# Patient Record
Sex: Female | Born: 1985 | Hispanic: No | Smoking: Former smoker
Health system: Southern US, Community
[De-identification: ages and names within clinical notes are randomized; demographics above are authoritative.]

## PROBLEM LIST (undated history)

## (undated) DIAGNOSIS — J9819 Other pulmonary collapse: Secondary | ICD-10-CM

## (undated) DIAGNOSIS — F329 Major depressive disorder, single episode, unspecified: Secondary | ICD-10-CM

## (undated) DIAGNOSIS — F32A Depression, unspecified: Secondary | ICD-10-CM

## (undated) DIAGNOSIS — W3400XA Accidental discharge from unspecified firearms or gun, initial encounter: Secondary | ICD-10-CM

## (undated) DIAGNOSIS — G5600 Carpal tunnel syndrome, unspecified upper limb: Secondary | ICD-10-CM

## (undated) DIAGNOSIS — K529 Noninfective gastroenteritis and colitis, unspecified: Secondary | ICD-10-CM

## (undated) DIAGNOSIS — F1910 Other psychoactive substance abuse, uncomplicated: Secondary | ICD-10-CM

## (undated) DIAGNOSIS — R569 Unspecified convulsions: Secondary | ICD-10-CM

## (undated) DIAGNOSIS — F419 Anxiety disorder, unspecified: Secondary | ICD-10-CM

## (undated) DIAGNOSIS — F4310 Post-traumatic stress disorder, unspecified: Secondary | ICD-10-CM

## (undated) DIAGNOSIS — E669 Obesity, unspecified: Secondary | ICD-10-CM

## (undated) HISTORY — DX: Other psychoactive substance abuse, uncomplicated: F19.10

## (undated) HISTORY — PX: TUBAL LIGATION: SHX77

---
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
## 2006-02-26 ENCOUNTER — Emergency Department (HOSPITAL_COMMUNITY): Admission: EM | Admit: 2006-02-26 | Discharge: 2006-02-26 | Payer: Self-pay | Admitting: Emergency Medicine

## 2006-06-19 ENCOUNTER — Emergency Department (HOSPITAL_COMMUNITY): Admission: EM | Admit: 2006-06-19 | Discharge: 2006-06-19 | Payer: Self-pay | Admitting: Emergency Medicine

## 2006-09-04 ENCOUNTER — Inpatient Hospital Stay (HOSPITAL_COMMUNITY): Admission: AD | Admit: 2006-09-04 | Discharge: 2006-09-04 | Payer: Self-pay | Admitting: Gynecology

## 2006-09-08 ENCOUNTER — Ambulatory Visit (HOSPITAL_COMMUNITY): Admission: RE | Admit: 2006-09-08 | Discharge: 2006-09-08 | Payer: Self-pay | Admitting: Family Medicine

## 2006-12-19 ENCOUNTER — Ambulatory Visit: Payer: Self-pay | Admitting: Obstetrics & Gynecology

## 2006-12-19 ENCOUNTER — Inpatient Hospital Stay (HOSPITAL_COMMUNITY): Admission: AD | Admit: 2006-12-19 | Discharge: 2006-12-19 | Payer: Self-pay | Admitting: Obstetrics & Gynecology

## 2007-01-16 ENCOUNTER — Inpatient Hospital Stay (HOSPITAL_COMMUNITY): Admission: AD | Admit: 2007-01-16 | Discharge: 2007-01-16 | Payer: Self-pay | Admitting: Family Medicine

## 2007-01-16 ENCOUNTER — Ambulatory Visit: Payer: Self-pay | Admitting: Family Medicine

## 2007-01-17 ENCOUNTER — Inpatient Hospital Stay (HOSPITAL_COMMUNITY): Admission: AD | Admit: 2007-01-17 | Discharge: 2007-01-20 | Payer: Self-pay | Admitting: Gynecology

## 2007-01-17 ENCOUNTER — Ambulatory Visit: Payer: Self-pay | Admitting: Gynecology

## 2007-05-10 ENCOUNTER — Inpatient Hospital Stay (HOSPITAL_COMMUNITY): Admission: AD | Admit: 2007-05-10 | Discharge: 2007-05-10 | Payer: Self-pay | Admitting: Gynecology

## 2007-05-19 ENCOUNTER — Ambulatory Visit: Payer: Self-pay | Admitting: Obstetrics & Gynecology

## 2007-06-30 ENCOUNTER — Other Ambulatory Visit: Admission: RE | Admit: 2007-06-30 | Discharge: 2007-06-30 | Payer: Self-pay | Admitting: Obstetrics & Gynecology

## 2007-06-30 ENCOUNTER — Ambulatory Visit: Payer: Self-pay | Admitting: Obstetrics and Gynecology

## 2007-06-30 ENCOUNTER — Encounter: Payer: Self-pay | Admitting: Obstetrics and Gynecology

## 2007-07-26 ENCOUNTER — Emergency Department (HOSPITAL_COMMUNITY): Admission: EM | Admit: 2007-07-26 | Discharge: 2007-07-26 | Payer: Self-pay | Admitting: Emergency Medicine

## 2007-10-10 ENCOUNTER — Emergency Department (HOSPITAL_COMMUNITY): Admission: EM | Admit: 2007-10-10 | Discharge: 2007-10-11 | Payer: Self-pay | Admitting: Emergency Medicine

## 2007-11-08 ENCOUNTER — Emergency Department (HOSPITAL_COMMUNITY): Admission: EM | Admit: 2007-11-08 | Discharge: 2007-11-08 | Payer: Self-pay | Admitting: Emergency Medicine

## 2007-12-25 ENCOUNTER — Emergency Department (HOSPITAL_COMMUNITY): Admission: EM | Admit: 2007-12-25 | Discharge: 2007-12-25 | Payer: Self-pay | Admitting: Emergency Medicine

## 2008-01-26 ENCOUNTER — Emergency Department (HOSPITAL_COMMUNITY): Admission: EM | Admit: 2008-01-26 | Discharge: 2008-01-26 | Payer: Self-pay | Admitting: Emergency Medicine

## 2008-02-16 ENCOUNTER — Emergency Department (HOSPITAL_COMMUNITY): Admission: EM | Admit: 2008-02-16 | Discharge: 2008-02-17 | Payer: Self-pay | Admitting: Emergency Medicine

## 2008-12-12 ENCOUNTER — Ambulatory Visit: Payer: Self-pay | Admitting: Family

## 2008-12-12 ENCOUNTER — Inpatient Hospital Stay (HOSPITAL_COMMUNITY): Admission: AD | Admit: 2008-12-12 | Discharge: 2008-12-12 | Payer: Self-pay | Admitting: Obstetrics & Gynecology

## 2008-12-20 ENCOUNTER — Ambulatory Visit: Payer: Self-pay | Admitting: Obstetrics and Gynecology

## 2008-12-20 ENCOUNTER — Inpatient Hospital Stay (HOSPITAL_COMMUNITY): Admission: AD | Admit: 2008-12-20 | Discharge: 2008-12-20 | Payer: Self-pay | Admitting: Obstetrics & Gynecology

## 2008-12-24 ENCOUNTER — Ambulatory Visit: Payer: Self-pay | Admitting: Obstetrics & Gynecology

## 2008-12-24 LAB — CONVERTED CEMR LAB

## 2008-12-27 ENCOUNTER — Ambulatory Visit (HOSPITAL_COMMUNITY): Admission: RE | Admit: 2008-12-27 | Discharge: 2008-12-27 | Payer: Self-pay | Admitting: Obstetrics & Gynecology

## 2008-12-31 ENCOUNTER — Ambulatory Visit (HOSPITAL_COMMUNITY): Admission: RE | Admit: 2008-12-31 | Discharge: 2008-12-31 | Payer: Self-pay | Admitting: Obstetrics & Gynecology

## 2008-12-31 ENCOUNTER — Ambulatory Visit: Payer: Self-pay | Admitting: Obstetrics & Gynecology

## 2009-01-06 ENCOUNTER — Inpatient Hospital Stay (HOSPITAL_COMMUNITY): Admission: AD | Admit: 2009-01-06 | Discharge: 2009-01-08 | Payer: Self-pay | Admitting: Obstetrics & Gynecology

## 2009-01-06 ENCOUNTER — Ambulatory Visit: Payer: Self-pay | Admitting: Advanced Practice Midwife

## 2009-01-09 ENCOUNTER — Inpatient Hospital Stay (HOSPITAL_COMMUNITY): Admission: AD | Admit: 2009-01-09 | Discharge: 2009-01-09 | Payer: Self-pay | Admitting: Obstetrics & Gynecology

## 2009-01-10 ENCOUNTER — Ambulatory Visit: Payer: Self-pay | Admitting: Obstetrics & Gynecology

## 2009-01-17 ENCOUNTER — Ambulatory Visit: Payer: Self-pay | Admitting: Obstetrics & Gynecology

## 2009-01-21 ENCOUNTER — Ambulatory Visit: Payer: Self-pay | Admitting: Obstetrics & Gynecology

## 2009-01-24 ENCOUNTER — Ambulatory Visit: Payer: Self-pay | Admitting: Obstetrics & Gynecology

## 2009-01-24 ENCOUNTER — Ambulatory Visit (HOSPITAL_COMMUNITY): Admission: RE | Admit: 2009-01-24 | Discharge: 2009-01-24 | Payer: Self-pay | Admitting: Obstetrics & Gynecology

## 2009-01-25 ENCOUNTER — Ambulatory Visit: Payer: Self-pay | Admitting: Obstetrics & Gynecology

## 2009-01-25 ENCOUNTER — Inpatient Hospital Stay (HOSPITAL_COMMUNITY): Admission: AD | Admit: 2009-01-25 | Discharge: 2009-01-29 | Payer: Self-pay | Admitting: Obstetrics & Gynecology

## 2009-01-26 ENCOUNTER — Encounter: Payer: Self-pay | Admitting: Obstetrics & Gynecology

## 2009-02-15 ENCOUNTER — Emergency Department (HOSPITAL_COMMUNITY): Admission: EM | Admit: 2009-02-15 | Discharge: 2009-02-16 | Payer: Self-pay | Admitting: Internal Medicine

## 2009-02-21 ENCOUNTER — Inpatient Hospital Stay (HOSPITAL_COMMUNITY): Admission: AD | Admit: 2009-02-21 | Discharge: 2009-02-21 | Payer: Self-pay | Admitting: Family Medicine

## 2009-02-21 ENCOUNTER — Ambulatory Visit: Payer: Self-pay | Admitting: Obstetrics and Gynecology

## 2009-03-05 ENCOUNTER — Inpatient Hospital Stay (HOSPITAL_COMMUNITY): Admission: AD | Admit: 2009-03-05 | Discharge: 2009-03-06 | Payer: Self-pay | Admitting: Obstetrics & Gynecology

## 2009-03-05 ENCOUNTER — Ambulatory Visit: Payer: Self-pay | Admitting: Obstetrics & Gynecology

## 2009-03-09 ENCOUNTER — Ambulatory Visit: Payer: Self-pay | Admitting: Advanced Practice Midwife

## 2009-03-09 ENCOUNTER — Inpatient Hospital Stay (HOSPITAL_COMMUNITY): Admission: AD | Admit: 2009-03-09 | Discharge: 2009-03-09 | Payer: Self-pay | Admitting: Obstetrics & Gynecology

## 2009-06-25 ENCOUNTER — Emergency Department (HOSPITAL_COMMUNITY): Admission: EM | Admit: 2009-06-25 | Discharge: 2009-06-25 | Payer: Self-pay | Admitting: Emergency Medicine

## 2010-07-06 ENCOUNTER — Encounter: Payer: Self-pay | Admitting: Emergency Medicine

## 2010-07-07 ENCOUNTER — Encounter: Payer: Self-pay | Admitting: Obstetrics & Gynecology

## 2010-09-19 LAB — WET PREP, GENITAL
Clue Cells Wet Prep HPF POC: NONE SEEN
Clue Cells Wet Prep HPF POC: NONE SEEN
Trich, Wet Prep: NONE SEEN

## 2010-09-19 LAB — CBC
HCT: 33.3 % — ABNORMAL LOW (ref 36.0–46.0)
Hemoglobin: 11.2 g/dL — ABNORMAL LOW (ref 12.0–15.0)
MCHC: 33.7 g/dL (ref 30.0–36.0)
RBC: 3.89 MIL/uL (ref 3.87–5.11)

## 2010-09-19 LAB — URINE MICROSCOPIC-ADD ON

## 2010-09-19 LAB — URINALYSIS, ROUTINE W REFLEX MICROSCOPIC
Bilirubin Urine: NEGATIVE
Glucose, UA: NEGATIVE mg/dL
Hgb urine dipstick: NEGATIVE
Specific Gravity, Urine: 1.025 (ref 1.005–1.030)
pH: 7 (ref 5.0–8.0)

## 2010-09-19 LAB — DIFFERENTIAL
Lymphocytes Relative: 41 % (ref 12–46)
Monocytes Absolute: 0.7 10*3/uL (ref 0.1–1.0)
Monocytes Relative: 8 % (ref 3–12)
Neutro Abs: 4.5 10*3/uL (ref 1.7–7.7)

## 2010-09-19 LAB — GC/CHLAMYDIA PROBE AMP, GENITAL
Chlamydia, DNA Probe: NEGATIVE
GC Probe Amp, Genital: NEGATIVE

## 2010-09-20 LAB — RPR: RPR Ser Ql: NONREACTIVE

## 2010-09-20 LAB — POCT URINALYSIS DIP (DEVICE)
Bilirubin Urine: NEGATIVE
Glucose, UA: NEGATIVE mg/dL
Glucose, UA: NEGATIVE mg/dL
Hgb urine dipstick: NEGATIVE
Nitrite: NEGATIVE
Nitrite: NEGATIVE
Protein, ur: 30 mg/dL — AB
Urobilinogen, UA: 0.2 mg/dL (ref 0.0–1.0)
Urobilinogen, UA: 0.2 mg/dL (ref 0.0–1.0)
pH: 6 (ref 5.0–8.0)

## 2010-09-20 LAB — CBC
HCT: 26.9 % — ABNORMAL LOW (ref 36.0–46.0)
Hemoglobin: 11.8 g/dL — ABNORMAL LOW (ref 12.0–15.0)
MCHC: 34.5 g/dL (ref 30.0–36.0)
MCHC: 34.6 g/dL (ref 30.0–36.0)
MCV: 90.9 fL (ref 78.0–100.0)
MCV: 90.3 fL (ref 78.0–100.0)
Platelets: 301 10*3/uL (ref 150–400)
Platelets: 234 10*3/uL (ref 150–400)
RBC: 3.05 MIL/uL — ABNORMAL LOW (ref 3.87–5.11)
RBC: 3.78 MIL/uL — ABNORMAL LOW (ref 3.87–5.11)
RDW: 13.4 % (ref 11.5–15.5)
WBC: 10.8 10*3/uL — ABNORMAL HIGH (ref 4.0–10.5)

## 2010-09-21 LAB — CLOSTRIDIUM DIFFICILE EIA: C difficile Toxins A+B, EIA: NEGATIVE

## 2010-09-21 LAB — URINE MICROSCOPIC-ADD ON

## 2010-09-21 LAB — POCT URINALYSIS DIP (DEVICE)
Glucose, UA: NEGATIVE mg/dL
Glucose, UA: NEGATIVE mg/dL
Hgb urine dipstick: NEGATIVE
Hgb urine dipstick: NEGATIVE
Ketones, ur: NEGATIVE mg/dL
Nitrite: NEGATIVE
Protein, ur: 100 mg/dL — AB
Specific Gravity, Urine: 1.02 (ref 1.005–1.030)
Specific Gravity, Urine: >=1.03 (ref 1.005–1.030)
Urobilinogen, UA: 1 mg/dL (ref 0.0–1.0)
pH: 7.5 (ref 5.0–8.0)

## 2010-09-21 LAB — FETAL FIBRONECTIN: Fetal Fibronectin: NEGATIVE

## 2010-09-21 LAB — CBC
HCT: 31.9 % — ABNORMAL LOW (ref 36.0–46.0)
HCT: 34 % — ABNORMAL LOW (ref 36.0–46.0)
Hemoglobin: 11.2 g/dL — ABNORMAL LOW (ref 12.0–15.0)
Hemoglobin: 11.9 g/dL — ABNORMAL LOW (ref 12.0–15.0)
MCHC: 34.9 g/dL (ref 30.0–36.0)
MCHC: 35.1 g/dL (ref 30.0–36.0)
MCV: 91 fL (ref 78.0–100.0)
MCV: 91.8 fL (ref 78.0–100.0)
Platelets: 277 10*3/uL (ref 150–400)
Platelets: 313 10*3/uL (ref 150–400)
RBC: 3.48 MIL/uL — ABNORMAL LOW (ref 3.87–5.11)
RBC: 3.73 MIL/uL — ABNORMAL LOW (ref 3.87–5.11)
RDW: 12.9 % (ref 11.5–15.5)
RDW: 13.2 % (ref 11.5–15.5)
WBC: 11.9 10*3/uL — ABNORMAL HIGH (ref 4.0–10.5)
WBC: 9.6 10*3/uL (ref 4.0–10.5)

## 2010-09-21 LAB — WET PREP, GENITAL
Clue Cells Wet Prep HPF POC: NONE SEEN
Trich, Wet Prep: NONE SEEN
Yeast Wet Prep HPF POC: NONE SEEN

## 2010-09-21 LAB — URINALYSIS, ROUTINE W REFLEX MICROSCOPIC
Bilirubin Urine: NEGATIVE
Glucose, UA: NEGATIVE mg/dL
Hgb urine dipstick: NEGATIVE
Hgb urine dipstick: NEGATIVE
Ketones, ur: 15 mg/dL — AB
Nitrite: NEGATIVE
Nitrite: NEGATIVE
Protein, ur: NEGATIVE mg/dL
Specific Gravity, Urine: 1.02 (ref 1.005–1.030)
Specific Gravity, Urine: 1.02 (ref 1.005–1.030)
Urobilinogen, UA: 1 mg/dL (ref 0.0–1.0)
Urobilinogen, UA: 1 mg/dL (ref 0.0–1.0)
pH: 6.5 (ref 5.0–8.0)
pH: 7 (ref 5.0–8.0)

## 2010-09-21 LAB — STREP B DNA PROBE: Strep Group B Ag: NEGATIVE

## 2010-09-21 LAB — GC/CHLAMYDIA PROBE AMP, GENITAL
Chlamydia, DNA Probe: NEGATIVE
GC Probe Amp, Genital: NEGATIVE

## 2010-09-22 LAB — WET PREP, GENITAL
Trich, Wet Prep: NONE SEEN
Yeast Wet Prep HPF POC: NONE SEEN

## 2010-09-22 LAB — FETAL FIBRONECTIN: Fetal Fibronectin: NEGATIVE

## 2010-09-22 LAB — URINALYSIS, ROUTINE W REFLEX MICROSCOPIC
Bilirubin Urine: NEGATIVE
Glucose, UA: NEGATIVE mg/dL
Hgb urine dipstick: NEGATIVE
Ketones, ur: NEGATIVE mg/dL
pH: 7 (ref 5.0–8.0)

## 2010-09-22 LAB — STREP B DNA PROBE: Strep Group B Ag: NEGATIVE

## 2010-10-28 NOTE — Group Therapy Note (Signed)
NAME:  Patricia Drake, Patricia Drake NO.:  [id number]   MEDICAL RECORD NO.:  [id number]          PATIENT TYPE:  WOC   LOCATION:  [facility]                   FACILITY:  [facility]   PHYSICIAN:  Elsie Lincoln, MD      DATE OF BIRTH:  [birth year]   DATE OF SERVICE:  05/19/2007                                  CLINIC NOTE   The patient is a 25 year old G 1 P 1 female who presents for evaluation  for treatment.  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  However, she had an endocervical curettage that came  back detached fragments of dysplastic squamous mucosa.  Given that this  is a vague positive ECC, I believe that we should go ahead with  excisional procedure.  I am not proceeding with cold knife cone because  there could be a chance left over from the biopsy on the cervix, and she  is very young, so we will just proceed with a LEEP.  She is going to  watch the video today and we will do a LEEP here in the office.  We will  schedule her in the next 4 to 6 weeks.           ______________________________  Elsie Lincoln, MD     KL/MEDQ  D:  05/19/2007  T:  05/19/2007  Job:  [id number]

## 2010-10-28 NOTE — Discharge Summary (Signed)
Patricia Drake, LEHRMAN               ACCOUNT NO.:  [id number]   MEDICAL RECORD NO.:  [id number]          PATIENT TYPE:  INP   LOCATION:  9304                          [facility]ACILITY:  [facility]   PHYSICIAN:  Catalina Antigua, MD     DATE OF BIRTH:  01/25/1986   DATE OF ADMISSION:  01/25/2009  DATE OF DISCHARGE:  01/29/2009                               DISCHARGE SUMMARY   DISCHARGE DIAGNOSES:  1. Preterm prolonged rupture of membranes at 34.6 weeks with twin      gestation, delivered.   Dictation ended at this point.      Estill Bamberg, MD      Catalina Antigua, MD     RL/MEDQ  D:  01/29/2009  T:  01/29/2009  Job:  [id number]

## 2010-10-28 NOTE — Discharge Summary (Signed)
Patricia Drake, Patricia Drake NO.:  [id number]   MEDICAL RECORD NO.:  [id number]          PATIENT TYPE:  WOC   LOCATION:  WOC                          FACILITY:  [facility]   PHYSICIAN:  Scheryl Darter, MD       DATE OF BIRTH:  17-Sep-1985   DATE OF ADMISSION:  01/06/2009  DATE OF DISCHARGE:  01/08/2009                               DISCHARGE SUMMARY   DIAGNOSES:  1. Intrauterine pregnancy at 32 weeks and 2 days gestation with twins.  2. Preterm labor, arrested.   The patient is a 25 year old white female, gravida 2, para 1-0-0-1 with  monozygotic twin gestation, estimated date of confinement on March 03, 2009, who presented at [redacted] weeks gestation complaining of symptoms of  preterm labor.  No rupture of membranes.  No bleeding.  She noted good  fetal movement.   PAST MEDICAL HISTORY:  Obesity.   PAST SURGICAL HISTORY:  None.   PAST OB HISTORY:  One term vaginal delivery.   PAST GYNECOLOGIC HISTORY:  Chlamydia and gonorrhea infection.   ALLERGIES:  SULFA drugs.   SOCIAL HISTORY:  She is a former smoker and denies any alcohol or drug  use.   FAMILY HISTORY:  Diabetes.   PHYSICAL EXAMINATION:  GENERAL:  The patient is an obese, white female.  VITAL SIGNS:  Stable, no acute distress.  CHEST:  Clear.  HEART:  Regular rhythm.  ABDOMEN:  Obese, gravid.  Sterile speculum exam revealed no abnormal  discharge or bleeding.  Cervix was 70% effaced, 2 cm dilated.  Bag of  water intact, -2 station.  EXTREMITIES:  Normal.   The patient was having irregular contractions consistent with possible  preterm labor.  She was admitted for tocolysis.  She received 2 doses of  betamethasone 12.5 mg.  She was given magnesium sulfate IV for tocolysis  and she was started on Procardia XL as well.  She exhibited no further  cervical diltation during hospitalization and on date of discharge,  cervix was 50% effaced, fingertip.  Ultrasound done on January 06, 2009,  showed that fetus in a  cephalic presentation.   IMPRESSION:  Intrauterine pregnancy at 32 weeks 2 days gestation of  twins with a mild premature cervical diltation and previous evidence of  early preterm labor, which was arrested.   PLAN:  The patient was discharged home with preterm labor precautions.  She was instructed pelvic rest and no activity.  She was to follow up in  2 days at [facility].  She is to continue Procardia XL 30 mg p.o.  b.i.d.      Scheryl Darter, MD  Electronically Signed     JA/MEDQ  D:  01/08/2009  T:  01/09/2009  Job:  [id number]

## 2010-10-28 NOTE — Discharge Summary (Signed)
Patricia Drake, GULLATT               ACCOUNT NO.:  [id number]   MEDICAL RECORD NO.:  [id number]          PATIENT TYPE:  INP   LOCATION:  9304                          [facility]   PHYSICIAN:  Catalina Antigua, MD     DATE OF BIRTH:  [birth year]   DATE OF ADMISSION:  01/25/2009  DATE OF DISCHARGE:  01/29/2009                               DISCHARGE SUMMARY   DISCHARGE DIAGNOSIS:  Twin gestation, delivered by normal spontaneous  vaginal delivery and low-transverse C-section at 34.6 weeks.   DISCHARGE MEDICATIONS:  1. Prenatal vitamins.  2. Ibuprofen 600 mg.  3. Colace 100 mg.  4. ProctoFoam HC.  5. Percocet 48 tablets.   PROCEDURES:  Primary low transverse C-section and bilateral tubal  ligation on January 26, 2009.   PREOPERATIVE DIAGNOSIS:  Cord prolapse, second twin at 34.5 as well as  undesired fertility.   LABORATORY DATA:  Group B strep negative on January 06, 2009.  Admission  CBC; white blood cell count of 10.3, hemoglobin 11.8, hematocrit 34.1,  platelets 285.  White count on January 27, 2009 was 10.8, hemoglobin of  9.5, hematocrit 27.5, platelets 234.  Hematocrit was stable.  Hemoglobin  was stable on the day of discharge at 9.1.   BRIEF HOSPITAL COURSE:  The patient was admitted on the January 25, 2009,  G2, P1at 34.6 weeks with a twin gestation.  She presented with a chief  complaint of rupture of membranes with clear fluid and she denied  vaginal bleeding and had positive fetal movement x2.  The patient was  admitted to Labor and Delivery and was monitored.  Pitocin augmentation  of labor began at 5:00 a.m.  Today after admission, an ultrasound was  performed to ensure that both babies were in vertex position and twin A  was born at 11:40 by normal spontaneous vaginal delivery.  However  immediately following twin B had a cord prolapse, so they proceeded to C-  section.  Both babies were sent to the NICU and the bilateral tubal  ligation was performed.  The patient  reported some dizziness following  the operation, however, orthostatics were negative and CBC.  Hemoglobin  remained stable.   DISCHARGE INSTRUCTIONS:  Followup with [facility].   DISCHARGE CONDITION:  The patient was discharged to home in stable  medical condition.      Estill Bamberg, MD      Catalina Antigua, MD  Electronically Signed    RL/MEDQ  D:  01/29/2009  T:  01/29/2009  Job:  [id number]

## 2010-10-28 NOTE — Op Note (Signed)
NAMESHAWNAE, LEIVA NO.:  [id number]   MEDICAL RECORD NO.:  [id number]           PATIENT TYPE:   LOCATION:                                 FACILITY:   PHYSICIAN:  Scheryl Darter, MD       DATE OF BIRTH:  Apr 24, 1986   DATE OF PROCEDURE:  01/26/2009  DATE OF DISCHARGE:                               OPERATIVE REPORT   PROCEDURES:  Primary low transverse cesarean section and bilateral tubal  ligation.   PREOPERATIVE DIAGNOSES:  1. Cord prolapse, second twin at 34 weeks 5 days gestation.  2. Undesired fertility.   POSTOPERATIVE DIAGNOSES:  1. Cord prolapse, second twin at 34 weeks 5 days gestation.  2. Undesired fertility.   SURGEON:  Scheryl Darter, MD   ANESTHESIA:  General.   ASSISTANT:  Crist Fat. Rivard, MD   ESTIMATED BLOOD LOSS:  700 mL.   FINDINGS:  Live born female infant delivered at 11:53, weight 4 pounds 7  ounces, Apgars 7 and 9.   COMPLICATIONS:  None.   DRAINS:  Foley catheter.   COUNTS:  Correct.   OPERATIVE COURSE:  The patient gave verbal consent for emergency  cesarean section after cord prolapse was identified after delivery of  the first twin of a 34 weeks 5 days gestation.  Second twin had been  identified to be cephalic.  After delivery of the first twin, cord  prolapse occurred.  She requested permanent sterilization and she  confirmed this verbally.  She has signed Medicaid papers for  sterilization.  She was brought to the OR.  She had an epidural catheter  in place, but anesthesia was not adequate via the epidural.  After  sterilely prepping and draping, general anesthesia was induced and a #10  blade was used to make a Pfannenstiel incision down to the fascia.  The  fascia was incised and incision was extended transversely.  Bellies of  rectus muscles was separated after taking down the midline tissue  attachments to the fascia.  Peritoneum was entered bluntly and  peritoneal cavity was exposed.  A #10 blade was used to make  an incision  at the midline of the lower uterine segment and the incision was  extended transversely with blunt traction.  Fetal head was elevated and  delivered.  Mouth and nose were cleared with bulb suction and infant was  delivered atraumatically and was vigorous at birth.  Cord was clamped  and cut, and infant was handed to nursery personnel.  Infant was live  born female, delivered at 11:53, 4 pounds 7 ounces, Apgars 7 and 9.  Cord  gas was sent after delivery.  The patient had received 1 g of IV Kefzol.  The placenta was removed manually and the uterus was explored.  The  patient received IV Pitocin.  A self-retaining retractor was placed.  The uterine incision was closed with imbricating layer followed with 0  Vicryl.  Hemostatic sutures were placed and good hemostasis was assured.  Pelvic gutters were irrigated.  After again confirming verbally with OR  staff, the patient had given verbal consent  for sterilization and she  had signed her Medicaid papers, we proceeded with tubal sterilization  procedure.  Filshie clip was placed on the right tube after the tube was  traced to its distal fimbriated end.  Good positioning was seen about  the proximal one third portion of the tube.  Same was done on the left  side.  Both tubes and ovaries appeared normal.  Pelvis was irrigated.  All packs and retractor was removed.  Anterior peritoneum was closed  with 2-0 Vicryl.  Fascia was closed with running suture of 0 Vicryl.  Skin incision was irrigated.  Good hemostasis was seen.  A 2-0 plain gut  suture was used to close the Scarpa's fascia with a running suture.  Skin was closed with a running subcuticular suture of 4-0 Vicryl.  Sterile dressing was applied.  The perineum was inspected and there were  no lacerations.  The patient was brought in stable condition to recovery  room.  She tolerated the procedure without complications.      Scheryl Darter, MD  Electronically Signed      JA/MEDQ  D:  01/26/2009  T:  01/26/2009  Job:  [id number]

## 2010-10-28 NOTE — Group Therapy Note (Signed)
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  Endocervical  curettage came back detached fragments and dysplastic squamous mucosa.  Given the vague positive ECC she was scheduled for a LEEP procedure.  In  seeing and talking to her today she is a smoker, although does not smoke  very much and we have talked to her about alternatives.  At her age  there is a good chance if she stops smoking that the healing and ridding  herself of the virus may certainly occur.  Therefore, I have told her  that we will to just and ECC today and a Pap of the endocervix and if  that is abnormal still we will call her back, but if that is not all  have her come back for re-Pap in 6 months.  She seems agreeable to this  and were going to go ahead and carry out that plan.   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           ______________________________  Argentina Donovan, MD     PR/MEDQ  D:  06/30/2007  T:  06/30/2007  Job:  [id number]

## 2011-03-10 LAB — URINALYSIS, ROUTINE W REFLEX MICROSCOPIC
Glucose, UA: NEGATIVE
Hgb urine dipstick: NEGATIVE
Specific Gravity, Urine: 1.024
pH: 8.5 — ABNORMAL HIGH

## 2011-03-10 LAB — WET PREP, GENITAL
Clue Cells Wet Prep HPF POC: NONE SEEN
WBC, Wet Prep HPF POC: NONE SEEN
Yeast Wet Prep HPF POC: NONE SEEN

## 2011-03-10 LAB — URINE CULTURE

## 2011-03-10 LAB — URINE MICROSCOPIC-ADD ON

## 2011-03-10 LAB — GC/CHLAMYDIA PROBE AMP, GENITAL: Chlamydia, DNA Probe: NEGATIVE

## 2011-03-18 LAB — COMPREHENSIVE METABOLIC PANEL
AST: 60 — ABNORMAL HIGH
BUN: 4 — ABNORMAL LOW
CO2: 22
Calcium: 9.1
Creatinine, Ser: 0.52
GFR calc Af Amer: >60
GFR calc non Af Amer: >60

## 2011-03-18 LAB — URINE CULTURE
Colony Count: NO GROWTH
Culture: NO GROWTH

## 2011-03-18 LAB — CBC
MCHC: 34.4
MCV: 91.8
RBC: 4.96

## 2011-03-18 LAB — DIFFERENTIAL
Eosinophils Relative: 3
Lymphocytes Relative: 34
Lymphs Abs: 3.4
Neutro Abs: 5.5
Neutrophils Relative %: 54

## 2011-03-18 LAB — URINALYSIS, ROUTINE W REFLEX MICROSCOPIC
Bilirubin Urine: NEGATIVE
Glucose, UA: NEGATIVE
Ketones, ur: NEGATIVE
Protein, ur: NEGATIVE
Urobilinogen, UA: 1

## 2011-03-18 LAB — WET PREP, GENITAL
Clue Cells Wet Prep HPF POC: NONE SEEN
Yeast Wet Prep HPF POC: NONE SEEN

## 2011-03-18 LAB — URINE MICROSCOPIC-ADD ON

## 2011-03-18 LAB — LIPASE, BLOOD: Lipase: 16

## 2011-03-24 LAB — POCT PREGNANCY, URINE: Operator id: 114931

## 2011-03-24 LAB — CBC
HCT: 42.8
MCV: 90.1
Platelets: 436 — ABNORMAL HIGH
RBC: 4.76
WBC: 11.1 — ABNORMAL HIGH

## 2011-03-24 LAB — WET PREP, GENITAL: Trich, Wet Prep: NONE SEEN

## 2011-03-24 LAB — SAMPLE TO BLOOD BANK

## 2011-03-24 LAB — GC/CHLAMYDIA PROBE AMP, GENITAL: GC Probe Amp, Genital: NEGATIVE

## 2011-03-30 LAB — CBC
HCT: 35.8 — ABNORMAL LOW
Hemoglobin: 12.3
MCV: 90.7
Platelets: 276
Platelets: 363
RBC: 3.95
RBC: 4.3
RDW: 13.8
WBC: 11.6 — ABNORMAL HIGH
WBC: 15.4 — ABNORMAL HIGH

## 2011-03-30 LAB — WET PREP, GENITAL: Trich, Wet Prep: NONE SEEN

## 2011-03-30 LAB — RPR: RPR Ser Ql: NONREACTIVE

## 2011-03-31 LAB — URINALYSIS, ROUTINE W REFLEX MICROSCOPIC
Ketones, ur: NEGATIVE
Nitrite: NEGATIVE
Protein, ur: NEGATIVE
Urobilinogen, UA: 0.2

## 2013-07-02 ENCOUNTER — Emergency Department (HOSPITAL_COMMUNITY)
Admission: EM | Admit: 2013-07-02 | Discharge: 2013-07-02 | Disposition: A | Discharge status: Home / Self Care | Payer: Self-pay | Attending: Emergency Medicine | Admitting: Emergency Medicine

## 2013-07-02 ENCOUNTER — Encounter (HOSPITAL_COMMUNITY): Payer: Self-pay | Admitting: Emergency Medicine

## 2013-07-02 ENCOUNTER — Emergency Department (HOSPITAL_COMMUNITY): Payer: Self-pay

## 2013-07-02 DIAGNOSIS — N390 Urinary tract infection, site not specified: Secondary | ICD-10-CM | POA: Insufficient documentation

## 2013-07-02 DIAGNOSIS — Z79899 Other long term (current) drug therapy: Secondary | ICD-10-CM | POA: Insufficient documentation

## 2013-07-02 DIAGNOSIS — G40909 Epilepsy, unspecified, not intractable, without status epilepticus: Secondary | ICD-10-CM

## 2013-07-02 DIAGNOSIS — F1220 Cannabis dependence, uncomplicated: Secondary | ICD-10-CM | POA: Insufficient documentation

## 2013-07-02 DIAGNOSIS — G40802 Other epilepsy, not intractable, without status epilepticus: Secondary | ICD-10-CM | POA: Insufficient documentation

## 2013-07-02 DIAGNOSIS — Z9119 Patient's noncompliance with other medical treatment and regimen: Secondary | ICD-10-CM | POA: Insufficient documentation

## 2013-07-02 DIAGNOSIS — N83202 Unspecified ovarian cyst, left side: Secondary | ICD-10-CM

## 2013-07-02 DIAGNOSIS — R197 Diarrhea, unspecified: Secondary | ICD-10-CM | POA: Insufficient documentation

## 2013-07-02 DIAGNOSIS — Z91199 Patient's noncompliance with other medical treatment and regimen due to unspecified reason: Secondary | ICD-10-CM | POA: Insufficient documentation

## 2013-07-02 DIAGNOSIS — F3289 Other specified depressive episodes: Secondary | ICD-10-CM | POA: Insufficient documentation

## 2013-07-02 DIAGNOSIS — Z9114 Patient's other noncompliance with medication regimen: Secondary | ICD-10-CM

## 2013-07-02 DIAGNOSIS — IMO0002 Reserved for concepts with insufficient information to code with codable children: Secondary | ICD-10-CM | POA: Insufficient documentation

## 2013-07-02 DIAGNOSIS — F411 Generalized anxiety disorder: Secondary | ICD-10-CM | POA: Insufficient documentation

## 2013-07-02 DIAGNOSIS — R109 Unspecified abdominal pain: Secondary | ICD-10-CM

## 2013-07-02 DIAGNOSIS — R112 Nausea with vomiting, unspecified: Secondary | ICD-10-CM

## 2013-07-02 DIAGNOSIS — N83209 Unspecified ovarian cyst, unspecified side: Secondary | ICD-10-CM | POA: Insufficient documentation

## 2013-07-02 DIAGNOSIS — F4310 Post-traumatic stress disorder, unspecified: Secondary | ICD-10-CM | POA: Insufficient documentation

## 2013-07-02 DIAGNOSIS — F329 Major depressive disorder, single episode, unspecified: Secondary | ICD-10-CM | POA: Insufficient documentation

## 2013-07-02 HISTORY — DX: Anxiety disorder, unspecified: F41.9

## 2013-07-02 HISTORY — DX: Noninfective gastroenteritis and colitis, unspecified: K52.9

## 2013-07-02 HISTORY — DX: Depression, unspecified: F32.A

## 2013-07-02 HISTORY — DX: Post-traumatic stress disorder, unspecified: F43.10

## 2013-07-02 HISTORY — DX: Major depressive disorder, single episode, unspecified: F32.9

## 2013-07-02 HISTORY — DX: Unspecified convulsions: R56.9

## 2013-07-02 LAB — POCT PREGNANCY, URINE: Preg Test, Ur: NEGATIVE

## 2013-07-02 LAB — COMPREHENSIVE METABOLIC PANEL
ALK PHOS: 57 U/L (ref 39–117)
ALT: 11 U/L (ref 0–35)
AST: 13 U/L (ref 0–37)
Albumin: 3.2 g/dL — ABNORMAL LOW (ref 3.5–5.2)
BUN: 7 mg/dL (ref 6–23)
CALCIUM: 8.4 mg/dL (ref 8.4–10.5)
CO2: 23 meq/L (ref 19–32)
Chloride: 103 mEq/L (ref 96–112)
Creatinine, Ser: 0.53 mg/dL (ref 0.50–1.10)
GFR calc Af Amer: >90 mL/min (ref >90)
GFR calc non Af Amer: >90 mL/min (ref >90)
GLUCOSE: 95 mg/dL (ref 70–99)
POTASSIUM: 3.6 meq/L — AB (ref 3.7–5.3)
SODIUM: 139 meq/L (ref 137–147)
TOTAL PROTEIN: 6.3 g/dL (ref 6.0–8.3)
Total Bilirubin: <0.2 mg/dL — ABNORMAL LOW (ref 0.3–1.2)

## 2013-07-02 LAB — CBC WITH DIFFERENTIAL/PLATELET
BASOS ABS: 0 10*3/uL (ref 0.0–0.1)
Basophils Relative: 0 % (ref 0–1)
EOS ABS: 0.4 10*3/uL (ref 0.0–0.7)
EOS PCT: 4 % (ref 0–5)
HCT: 39 % (ref 36.0–46.0)
Hemoglobin: 13.7 g/dL (ref 12.0–15.0)
LYMPHS ABS: 2.3 10*3/uL (ref 0.7–4.0)
LYMPHS PCT: 24 % (ref 12–46)
MCH: 31.6 pg (ref 26.0–34.0)
MCHC: 35.1 g/dL (ref 30.0–36.0)
MCV: 89.9 fL (ref 78.0–100.0)
Monocytes Absolute: 0.8 10*3/uL (ref 0.1–1.0)
Monocytes Relative: 8 % (ref 3–12)
NEUTROS PCT: 64 % (ref 43–77)
Neutro Abs: 6.2 10*3/uL (ref 1.7–7.7)
PLATELETS: 306 10*3/uL (ref 150–400)
RBC: 4.34 MIL/uL (ref 3.87–5.11)
RDW: 12.8 % (ref 11.5–15.5)
WBC: 9.7 10*3/uL (ref 4.0–10.5)

## 2013-07-02 LAB — URINALYSIS, ROUTINE W REFLEX MICROSCOPIC
BILIRUBIN URINE: NEGATIVE
Glucose, UA: NEGATIVE mg/dL
HGB URINE DIPSTICK: NEGATIVE
KETONES UR: NEGATIVE mg/dL
Nitrite: NEGATIVE
PH: 7.5 (ref 5.0–8.0)
Protein, ur: NEGATIVE mg/dL
SPECIFIC GRAVITY, URINE: 1.019 (ref 1.005–1.030)
Urobilinogen, UA: 0.2 mg/dL (ref 0.0–1.0)

## 2013-07-02 LAB — URINE MICROSCOPIC-ADD ON

## 2013-07-02 LAB — LIPASE, BLOOD: LIPASE: 17 U/L (ref 11–59)

## 2013-07-02 LAB — VALPROIC ACID LEVEL: Valproic Acid Lvl: <10 ug/mL — ABNORMAL LOW (ref 50.0–100.0)

## 2013-07-02 LAB — PHENYTOIN LEVEL, TOTAL: Phenytoin Lvl: <2.5 ug/mL — ABNORMAL LOW (ref 10.0–20.0)

## 2013-07-02 MED ORDER — SODIUM CHLORIDE 0.9 % IV SOLN
INTRAVENOUS | Status: DC
  Administered 2013-07-02: 18:00:00 via INTRAVENOUS

## 2013-07-02 MED ORDER — VALPROATE SODIUM 500 MG/5ML IV SOLN
1000.0000 mg | Freq: Once | INTRAVENOUS | Status: AC
  Administered 2013-07-02: 1000 mg via INTRAVENOUS
  Filled 2013-07-02: qty 10

## 2013-07-02 MED ORDER — IOHEXOL 300 MG/ML  SOLN
100.0000 mL | Freq: Once | INTRAMUSCULAR | Status: AC | PRN
  Administered 2013-07-02: 100 mL via INTRAVENOUS

## 2013-07-02 MED ORDER — SODIUM CHLORIDE 0.9 % IV SOLN
1000.0000 mg | INTRAVENOUS | Status: AC
  Administered 2013-07-02: 1000 mg via INTRAVENOUS
  Filled 2013-07-02: qty 20

## 2013-07-02 MED ORDER — CEPHALEXIN 500 MG PO CAPS: 500.0000 mg | ORAL_CAPSULE | Freq: Four times a day (QID) | ORAL | Status: DC

## 2013-07-02 NOTE — ED Notes (Signed)
Patient transported to CT 

## 2013-07-02 NOTE — ED Notes (Signed)
Pt reports abd pain and vomiting for the past couple of days. States that she has been off her seizure medications and recently just started back on them. States she just received them in the mail last night. Also reports neck pain.

## 2013-07-02 NOTE — ED Notes (Signed)
Patient states that she has a history of seizures and last seizure this morning around 10:30am. Seizure yesterday and patient fell striking the left side of face.She now complains of pain in the left side of the face. Alert and oriented and follows commands.

## 2013-07-02 NOTE — ED Notes (Signed)
Patient returned from CT

## 2013-07-02 NOTE — ED Provider Notes (Signed)
CSN: [id number]     Arrival date & time 07/02/13  1344 History   First MD Initiated Contact with Patient 07/02/13 1709     Chief Complaint  Patient presents with  . Abdominal Pain  . Emesis  . Diarrhea  . Seizures    HPI Pt was seen at 1720. Per pt, c/o gradual onset and persistence of multiple intermittent episodes of N/V/D that began several weeks ago, worse over the past several days. Describes the stools as "watery" and per her usual for the past several years.  Has been associated with mild left sided abd "pain." Denies CP/SOB, no back pain, no fevers, no black or blood in stools or emesis. Pt also c/o sudden onset and resolution of several intermittent episodes of "seizures" for the past 2 months. Pt states she has hx of same and has not been taking her seizure meds x2 months because she "just moved from [place] and forgot my meds there." Pt states she just received refills of same yesterday in the mail, but has not started them yet. States she "feels like a seizure is coming on" then "wakes up on the floor." States the left side of her head and neck hurt "after I woke up on the floor yesterday after having a seizure." Denies CP/palpitations, no SOB/cough, no fevers, no back pain, no confusion, no incont of bowel/bladder, no visual changes, no focal motor weakness, no tingling/numbness in extremities, no ataxia, no slurred speech, no facial droop.    Past Medical History  Diagnosis Date  . Seizures   . PTSD (post-traumatic stress disorder)   . Depression   . Anxiety   . Chronic diarrhea    Past Surgical History  Procedure Laterality Date  . Cesarean section    . Tubal ligation      History  Substance Use Topics  . Smoking status: Never Smoker   . Smokeless tobacco: Not on file  . Alcohol Use: No    Review of Systems ROS: Statement: All systems negative except as marked or noted in the HPI; Constitutional: Negative for fever and chills. ; ; Eyes: Negative for eye pain, redness  and discharge. ; ; ENMT: Negative for ear pain, hoarseness, nasal congestion, sinus pressure and sore throat. ; ; Cardiovascular: Negative for chest pain, palpitations, diaphoresis, dyspnea and peripheral edema. ; ; Respiratory: Negative for cough, wheezing and stridor. ; ; Gastrointestinal: +N/V/D, abd pain. Negative for blood in stool, hematemesis, jaundice and rectal bleeding. . ; ; Genitourinary: Negative for dysuria, flank pain and hematuria. ; ; GYN:  No vaginal bleeding, no vaginal discharge, no vulvar pain.;;  Musculoskeletal: Negative for back pain. Negative for swelling and deformity. +head injury, neck pain..; ; Skin: Negative for pruritus, rash, abrasions, blisters, bruising and skin lesion.; ; Neuro: Negative for headache, lightheadedness and neck stiffness. Negative for weakness, altered mental status, extremity weakness, paresthesias, +seizure.     Allergies  Sulfa antibiotics  Home Medications   Current Outpatient Rx  Name  Route  Sig  Dispense  Refill  . albuterol (PROVENTIL HFA;VENTOLIN HFA) 108 (90 BASE) MCG/ACT inhaler   Inhalation   Inhale 1-2 puffs into the lungs every 6 (six) hours as needed for wheezing or shortness of breath.         . divalproex (DEPAKOTE) 250 MG DR tablet   Oral   Take 250 mg by mouth every 12 (twelve) hours.         Marland Kitchen ibuprofen (ADVIL,MOTRIN) 800 MG tablet   Oral  Take 800 mg by mouth every 8 (eight) hours as needed (pain).         . phenytoin (DILANTIN) 100 MG ER capsule   Oral   Take 400 mg by mouth daily.         . sertraline (ZOLOFT) 100 MG tablet   Oral   Take 100 mg by mouth daily.         . traMADol (ULTRAM) 50 MG tablet   Oral   Take 50 mg by mouth every 4 (four) hours as needed (pain).         . traZODone (DESYREL) 50 MG tablet   Oral   Take 50 mg by mouth daily.         Marland Kitchen valproic acid (DEPAKENE) 250 MG capsule   Oral   Take 250 mg by mouth 4 (four) times daily.          BP 111/69  Pulse 82  Temp(Src)  98 F (36.7 C) (Oral)  Resp 18  SpO2 100%  LMP 06/18/2013 Physical Exam 1725: Physical examination:  Nursing notes reviewed; Vital signs and O2 SAT reviewed;  Constitutional: Well developed, Well nourished, Well hydrated, In no acute distress; Head:  Normocephalic, atraumatic. No facial swelling, tenderness, deformity, ecchymosis, or erythema.; Eyes: EOMI, PERRL, No scleral icterus; ENMT: Mouth and pharynx without lesions. No intra-oral edema, bleeding, or injury. No submandibular or sublingual edema. No hoarse voice, no drooling, no stridor. Mouth and pharynx normal, Mucous membranes moist; Neck: Supple, Full range of motion, No lymphadenopathy; Cardiovascular: Regular rate and rhythm, No murmur, rub, or gallop; Respiratory: Breath sounds clear & equal bilaterally, No rales, rhonchi, wheezes.  Speaking full sentences with ease, Normal respiratory effort/excursion; Chest: Nontender, Movement normal; Abdomen: Soft, +mild LUQ and LLQ tenderness to palp. No rebound or guarding. Nondistended, Normal bowel sounds; Genitourinary: No CVA tenderness; Spine:  No midline CS, TS, LS tenderness. +mild TTP left trapezius muscle. Extremities: Pulses normal, No tenderness, No edema, No calf edema or asymmetry.; Neuro: AA&Ox3, Major CN grossly intact. No facial droop. Speech clear. No gross focal motor or sensory deficits in extremities. Climbs on and off stretcher easily by herself. Gait steady.; Skin: Color normal, Warm, Dry.   ED Course  Procedures    EKG Interpretation   None       MDM  MDM Reviewed: previous chart, nursing note and vitals Reviewed previous: labs Interpretation: labs and CT scan     Ct Head Wo Contrast 07/02/2013   CLINICAL DATA:  Abdominal pain and vomiting. Seizure with fall and resulting neck pain.  EXAM: CT HEAD WITHOUT CONTRAST  CT CERVICAL SPINE WITHOUT CONTRAST  TECHNIQUE: Multidetector CT imaging of the head and cervical spine was performed following the standard protocol  without intravenous contrast. Multiplanar CT image reconstructions of the cervical spine were also generated.  COMPARISON:  None.  FINDINGS: CT HEAD FINDINGS  There is no evidence of acute intracranial hemorrhage, mass lesion, brain edema or extra-axial fluid collection. The ventricles and subarachnoid spaces are appropriately sized for age. Well-circumscribed lucency in the right occipital lobe on image 16 is probably asymmetric posterior extension of the occipital horn. There is no CT evidence of acute cortical infarction.  The visualized paranasal sinuses, mastoid air cells and middle ears are clear. The calvarium is intact.  CT CERVICAL SPINE FINDINGS  There is gradual reversal of the usual cervical lordosis. There is no focal angulation or listhesis. There is no evidence of acute fracture or traumatic subluxation.  No  acute soft tissue findings are demonstrated. The disc spaces are preserved. There is no osseous foraminal stenosis.  IMPRESSION: 1. No acute intracranial, calvarial or cervical spine findings. 2. Reversal of cervical lordosis, likely positional or secondary to muscle spasm. 3. No clear explanation for seizures. Right occipital lucency is likely incidental. However, if the seizures are new onset and of undetermined etiology, further evaluation with MRI may be warranted.   Electronically Signed   By: Roxy Horseman M.D.   On: 07/02/2013 18:35   Ct Cervical Spine Wo Contrast 07/02/2013   CLINICAL DATA:  Abdominal pain and vomiting. Seizure with fall and resulting neck pain.  EXAM: CT HEAD WITHOUT CONTRAST  CT CERVICAL SPINE WITHOUT CONTRAST  TECHNIQUE: Multidetector CT imaging of the head and cervical spine was performed following the standard protocol without intravenous contrast. Multiplanar CT image reconstructions of the cervical spine were also generated.  COMPARISON:  None.  FINDINGS: CT HEAD FINDINGS  There is no evidence of acute intracranial hemorrhage, mass lesion, brain edema or  extra-axial fluid collection. The ventricles and subarachnoid spaces are appropriately sized for age. Well-circumscribed lucency in the right occipital lobe on image 16 is probably asymmetric posterior extension of the occipital horn. There is no CT evidence of acute cortical infarction.  The visualized paranasal sinuses, mastoid air cells and middle ears are clear. The calvarium is intact.  CT CERVICAL SPINE FINDINGS  There is gradual reversal of the usual cervical lordosis. There is no focal angulation or listhesis. There is no evidence of acute fracture or traumatic subluxation.  No acute soft tissue findings are demonstrated. The disc spaces are preserved. There is no osseous foraminal stenosis.  IMPRESSION: 1. No acute intracranial, calvarial or cervical spine findings. 2. Reversal of cervical lordosis, likely positional or secondary to muscle spasm. 3. No clear explanation for seizures. Right occipital lucency is likely incidental. However, if the seizures are new onset and of undetermined etiology, further evaluation with MRI may be warranted.   Electronically Signed   By: Roxy Horseman M.D.   On: 07/02/2013 18:35   Ct Abdomen Pelvis W Contrast 07/02/2013   CLINICAL DATA:  Seizure and fall.  Mid abdominal pain and vomiting.  EXAM: CT ABDOMEN AND PELVIS WITH CONTRAST  TECHNIQUE: Multidetector CT imaging of the abdomen and pelvis was performed using the standard protocol following bolus administration of intravenous contrast.  CONTRAST:  OMNIPAQUE IOHEXOL 300 MG/ML  SOLN  COMPARISON:  CT PELVIS W/CM dated 03/06/2009  FINDINGS: Lung bases: Radiopaque foreign bodies in the lower right chest, new since the prior exam. Areas of presumed secondary scarring. Normal heart size without pericardial or pleural effusion.  Abdomen/Pelvis: Mild hepatic steatosis and moderate hepatomegaly. Normal spleen, stomach, pancreas, gallbladder, biliary tract, adrenal glands, kidneys.  No retroperitoneal or retrocrural  adenopathy. Low pelvis minimally excluded. Normal colon, appendix, and terminal ileum. Normal small bowel without abdominal ascites. No pelvic adenopathy. No pneumatosis or free intraperitoneal air. Normal urinary bladder and uterus. Suspect a left ovarian corpus luteal cyst at 1.2 cm on image 64. No significant free fluid. Prior tubal ligation.  Bones/Musculoskeletal: No acute osseous abnormality.  IMPRESSION: 1. No acute or posttraumatic deformity identified. 2. Interval metallic foreign objects about the inferior right chest with presumed right basilar scarring. 3. Probable left ovarian corpus luteal cyst. 4. Low pelvis partially excluded.   Electronically Signed   By: Jeronimo Greaves M.D.   On: 07/02/2013 18:37   Results for orders placed during the hospital encounter of 07/02/13  CBC WITH DIFFERENTIAL      Result Value Range   WBC 9.7  4.0 - 10.5 K/uL   RBC 4.34  3.87 - 5.11 MIL/uL   Hemoglobin 13.7  12.0 - 15.0 g/dL   HCT 16.1  09.6 - 04.5 %   MCV 89.9  78.0 - 100.0 fL   MCH 31.6  26.0 - 34.0 pg   MCHC 35.1  30.0 - 36.0 g/dL   RDW 40.9  81.1 - 91.4 %   Platelets 306  150 - 400 K/uL   Neutrophils Relative % 64  43 - 77 %   Neutro Abs 6.2  1.7 - 7.7 K/uL   Lymphocytes Relative 24  12 - 46 %   Lymphs Abs 2.3  0.7 - 4.0 K/uL   Monocytes Relative 8  3 - 12 %   Monocytes Absolute 0.8  0.1 - 1.0 K/uL   Eosinophils Relative 4  0 - 5 %   Eosinophils Absolute 0.4  0.0 - 0.7 K/uL   Basophils Relative 0  0 - 1 %   Basophils Absolute 0.0  0.0 - 0.1 K/uL  COMPREHENSIVE METABOLIC PANEL      Result Value Range   Sodium 139  137 - 147 mEq/L   Potassium 3.6 (*) 3.7 - 5.3 mEq/L   Chloride 103  96 - 112 mEq/L   CO2 23  19 - 32 mEq/L   Glucose, Bld 95  70 - 99 mg/dL   BUN 7  6 - 23 mg/dL   Creatinine, Ser 7.82  0.50 - 1.10 mg/dL   Calcium 8.4  8.4 - 95.6 mg/dL   Total Protein 6.3  6.0 - 8.3 g/dL   Albumin 3.2 (*) 3.5 - 5.2 g/dL   AST 13  0 - 37 U/L   ALT 11  0 - 35 U/L   Alkaline Phosphatase 57   39 - 117 U/L   Total Bilirubin <0.2 (*) 0.3 - 1.2 mg/dL   GFR calc non Af Amer >90  >90 mL/min   GFR calc Af Amer >90  >90 mL/min  URINALYSIS, ROUTINE W REFLEX MICROSCOPIC      Result Value Range   Color, Urine YELLOW  YELLOW   APPearance CLOUDY (*) CLEAR   Specific Gravity, Urine 1.019  1.005 - 1.030   pH 7.5  5.0 - 8.0   Glucose, UA NEGATIVE  NEGATIVE mg/dL   Hgb urine dipstick NEGATIVE  NEGATIVE   Bilirubin Urine NEGATIVE  NEGATIVE   Ketones, ur NEGATIVE  NEGATIVE mg/dL   Protein, ur NEGATIVE  NEGATIVE mg/dL   Urobilinogen, UA 0.2  0.0 - 1.0 mg/dL   Nitrite NEGATIVE  NEGATIVE   Leukocytes, UA MODERATE (*) NEGATIVE  URINE MICROSCOPIC-ADD ON      Result Value Range   Squamous Epithelial / LPF MANY (*) RARE   WBC, UA 7-10  <3 WBC/hpf   Bacteria, UA FEW (*) RARE  VALPROIC ACID LEVEL      Result Value Range   Valproic Acid Lvl <10.0 (*) 50.0 - 100.0 ug/mL  PHENYTOIN LEVEL, TOTAL      Result Value Range   Phenytoin Lvl <2.5 (*) 10.0 - 20.0 ug/mL  LIPASE, BLOOD      Result Value Range   Lipase 17  11 - 59 U/L  POCT PREGNANCY, URINE      Result Value Range   Preg Test, Ur NEGATIVE  NEGATIVE    2245:  Pt has tol PO well while in the ED  without N/V.  No stooling while in the ED.  Abd benign, VSS. Feels better and wants to go home now. No seizure activity while in the ED. Has been ambulatory with steady gait. Pt does endorse urinary frequency but not dysuria. Will tx cystitis with keflex while UC pending. Pt states she has her "seizure medicines" at home and will start to take them tomorrow; has been given IV loading dose of both while in the ED. Dx and testing d/w pt.  Questions answered.  Verb understanding, agreeable to d/c home with outpt f/u.      Laray AngerKathleen M Corlis Angelica, DO 07/04/13 1505

## 2013-07-02 NOTE — Discharge Instructions (Signed)
°Emergency Department Resource Guide °1) Find a Doctor and Pay Out of Pocket °Although you won't have to find out who is covered by your insurance plan, it is a good idea to ask around and get recommendations. You will then need to call the office and see if the doctor you have chosen will accept you as a new patient and what types of options they offer for patients who are self-pay. Some doctors offer discounts or will set up payment plans for their patients who do not have insurance, but you will need to ask so you aren't surprised when you get to your appointment. ° °2) Contact Your Local Health Department °Not all health departments have doctors that can see patients for sick visits, but many do, so it is worth a call to see if yours does. If you don't know where your local health department is, you can check in your phone book. The CDC also has a tool to help you locate your state's health department, and many state websites also have listings of all of their local health departments. ° °3) Find a Walk-in Clinic °If your illness is not likely to be very severe or complicated, you may want to try a walk in clinic. These are popping up all over the country in pharmacies, drugstores, and shopping centers. They're usually staffed by nurse practitioners or physician assistants that have been trained to treat common illnesses and complaints. They're usually fairly quick and inexpensive. However, if you have serious medical issues or chronic medical problems, these are probably not your best option. ° °No Primary Care Doctor: °- Call Health Connect at  832-8000 - they can help you locate a primary care doctor that  accepts your insurance, provides certain services, etc. °- Physician Referral Service- 1-800-533-3463 ° °Chronic Pain Problems: °Organization         Address  Phone   Notes  °[facility]  (336) 297-2271 Patients need to be referred by their primary care doctor.  ° °Medication  Assistance: °Organization         Address  Phone   Notes  °[place] [facility] [address] °[place], [place] [address] (336) 641-8030 --Must be a resident of [place] °-- Must have NO insurance coverage whatsoever (no Medicaid/ Medicare, etc.) °-- The pt. MUST have a primary care doctor that directs their care regularly and follows them in the community °  °[facility]  (866) 331-1348   °United Way  (888) 892-1162   ° °Agencies that provide inexpensive medical care: °Organization         Address  Phone   Notes  °[facility]  (336) 832-8035   °[facility]    (336) 832-7272   °[facility] [address] °[place], [place] [address] (336) 832-4777   °[facility] [address], °[place] (336) 271-4999   °[facility]    (336) 373-0678   °[facility]    (336) 272-1050   °[facility] ° [address], [place] Phone:  (336) 832-4444, Fax:  (336) 832-4440 Hours of Operation:  9 am - 6 pm, M-F.  Also accepts Medicaid/Medicare and self-pay.  °[facility] ° [address], [place] Phone: (336) 832-3150, Fax: (336) 832-3151. Hours of Operation:  8:30 am - 5:30 pm, M-F.  Also accepts Medicaid and self-pay.  °[facility] [place] [address], [place] Phone: (336) 878-6027   °[facility] [address], [place], [place] (336)723-1848, Ext. 123 Mondays & Thursdays: 7-9 AM.  First 15 patients are seen on a first come, first serve basis. °  ° °Medicaid-accepting [place] Providers: ° °Organization         Address  Phone   Notes  °[facility] [address], [place] (336) 641-2100 Also accepts self-pay patients.  °[facility] [address], [place] ° (336) 856-9996   °[facility] [address], [place]  (336) 288-8857   °[facility] [address] [place] [address], [place] (336) 299-7000   °Veita Bland [address], [place]  ° (336) 373-1557 Only accepts [place] Access Medicaid patients after they have their name applied to their card.  ° °Self-Pay (no insurance) in [place]: ° °Organization         Address  Phone   Notes  °Sickle Cell Patients, [facility] [address], [place] (336) 832-1970   °[facility] [address], [place] (336) 832-4400   °[facility] ° [address], [place] (336) 992-4800   °[facility]/Dr. Osei-Bonsu ° [address] [place] [address], [place] or [address], [place] (336) 841-8500 Phone number for both [place] and [place] locations is the same.  °[facility] [address], [place] (336) 299-0000   °[facility] [address] [place] [address], [place] or [address] (336) 852-7530 °(336) 878-2260   °[facility] [address], [place] (336) 350-1642, phone; (336) 294-5005, fax Sees patients 1st and 3rd Saturday of every month.  Must not qualify for public or private insurance (i.e. Medicaid, Medicare, [place] Health Choice, Veterans' Benefits) • Household income should be no more than 200% of the poverty level •The clinic cannot treat you if you are pregnant or think you are pregnant • Sexually transmitted diseases are not treated at the clinic.  ° ° °Dental Care: °Organization         Address  Phone  Notes  °[place] [facility] [address], [place] (336) 641-6152 Accepts children up to age 21 who are enrolled in Medicaid or [place] Health Choice; pregnant women with a Medicaid card; and children who have applied for Medicaid or [place] Health Choice, but were declined, whose parents can pay a reduced fee at time of service.  °[place]  [facility] [place]  [address], [place] (336) 641-7733 Accepts children up to age 21 who are enrolled in Medicaid or [place] Health Choice; pregnant women with a Medicaid card; and children who have applied for Medicaid or [place] Health Choice, but were declined, whose parents can pay a reduced fee at time of service.  °[facility] ° [address], [place] (336) 641-4533 Patients are seen by appointment only. Walk-ins are not accepted. [facility] will see patients 18 years of age and older. °Monday - Tuesday (8am-5pm) °Most Wednesdays (8:30-5pm) °$30 per visit, cash only  °[facility] ° [address], [place] (336) 641-4533 Patients are seen by appointment only. Walk-ins are not accepted. [facility] will see patients 18 years of age and older. °One   Wednesday Evening (Monthly: Volunteer Based).  $30 per visit, cash only  °[facility]  (919) 537-3737 for adults; Children under age 4, call Graduate Pediatric Dentistry at (919) [phone]. Children aged 4-14, please call (919) [phone] to request a pediatric application. ° Dental services are provided in all areas of dental care including fillings, crowns and bridges, complete and partial dentures, implants, gum treatment, root canals, and extractions. Preventive care is also provided. Treatment is provided to both adults and children. °Patients are selected via a lottery and there is often a waiting list. °  °[facility] [address], °[place] ° (336) 763-8833 [email or web address] °  °[facility] [address], [place], [place] (336)723-1848, Ext. 123 Second and Fourth Thursday of each month, opens at 6:30 AM; Clinic ends at 9 AM.  Patients are seen on a first-come first-served basis, and a limited number are seen during each clinic.  ° °[facility] ° [address], [place], [place] (336) 723-7904    Eligibility Requirements °You must have lived in [place], [place], or [place] counties for at least the last three months. °  You cannot be eligible for state or federal sponsored healthcare insurance, including Veterans Administration, Medicaid, or Medicare. °  You generally cannot be eligible for healthcare insurance through your employer.  °  How to apply: °Eligibility screenings are held every Tuesday and Wednesday afternoon from 1:00 pm until 4:00 pm. You do not need an appointment for the interview!  °[facility] [address], [place], [place] 336-631-2330   °[facility] [facility]  336-342-8273   °[place]  336-703-3100   °[facility]  336-570-6415   ° °Behavioral Health Resources in the Community: °Intensive Outpatient Programs °Organization         Address  Phone  Notes  °[facility] [address], [place], [place] 336-878-6098   °[facility] [address], [place], [place] 336-832-9800   °[facility] [address], [place], [place] ° 336-882-2125   °[facility] [address],  °[place], [place] 1-800-853-5163 or 336-641-4981   °Substance Abuse Resources °Organization         Address  Phone  Notes  °[facility]  336-882-2125   °[facility]  336-784-9470   °[facility]  336-285-9073   °[facility]  336-845-3988   °Residential & Outpatient Substance Abuse Program  1-800-659-3381   °Psychological Services °Organization         Address  Phone  Notes  °[facility]  336- 832-9600   °[facility]  336- 378-7881   °[facility] [address], [place] 1-800-853-5163 or 336-641-4981   ° °Mobile Crisis Teams °Organization         Address  Phone  Notes  °[facility], Mobile Crisis Care Unit  1-877-626-1772   °Assertive °[facility] ° [address]  [place], [place] 336-834-9664   °Sharon DeEsch [address] °[place] 336-554-5454   ° °Self-Help/Support Groups °Organization         Address  Phone             Notes  °[facility]oc[facility] of  - variety of support groups  336- 373-1402 Call for more information  °Narcotics Anonymous (NA), [facility] [address], °[place]  2 meetings at this location  ° °  Residential Treatment Programs Organization         Address  Phone  Notes  [facility] [address],    [place]  [phone]   [facility]  [address], [place] [phone], [place], [place] [phone]   [facility] [address], [place] [phone] Admissions: 8am-3pm M-F  [facility] [address]    [place], [place] [phone]   [facility] [address], [place], [place] [phone]   [facility] [address]  [place], [place] [phone]   [facility] Intensive Outpatient [address], [place], [place] [phone]   [facility] [address]  [place], [place] [phone] or [phone]   [facility] ([facility]) [address] [place], [place] [phone] Accepts Medicaid  [facility] [address]  [place] [phone]) [phone] Substance Abuse/Addiction Treatment   [facility]         Address  Phone  Notes  [facility]  [phone](888) [phone]   Angie FavaJulie Brannon, PhD [address], [address] [place], [place]   [phone](336) [phone] or ([phone])[phone](336) [phone]   [facility]   [address] [place], [place] ([phone]) [phone](336) [phone]   [facility] [address], [place], [place] [phone](336) [phone] Insurance/Medicaid/sponsorship through [facility] [address]                                    [place], [place] [phone](336) [phone] Therapy/tele-psych/case    [facility] [address]   [place], [place] [phone](336) [phone]    Dr. Lolly MustacheArfeen  ([phone]) [phone](336) [phone]   [facility]  [employer] [facility] 1) [address], [place] 2) [address], [place] 3)  [address], [place] [phone](336) [phone](336) [phone]  [phone](336) [phone]   [facility] Child Abuse Hotline 503-477-7311(336) 351-656-5563 or [phone](336) [phone] (After Hours)       Take the prescription as directed. Take you usual medications as previously prescribed.  Call your regular medical doctor, the Neurologist and the OB/GYN doctor tomorrow morning to schedule a follow up appointment this week.  Return to the Emergency Department immediately sooner if worsening.

## 2013-07-03 LAB — URINE CULTURE
COLONY COUNT: NO GROWTH
CULTURE: NO GROWTH

## 2013-08-04 ENCOUNTER — Emergency Department (HOSPITAL_COMMUNITY): Payer: Self-pay

## 2013-08-04 ENCOUNTER — Encounter (HOSPITAL_COMMUNITY): Payer: Self-pay | Admitting: Emergency Medicine

## 2013-08-04 ENCOUNTER — Emergency Department (HOSPITAL_COMMUNITY)
Admission: EM | Admit: 2013-08-04 | Discharge: 2013-08-04 | Disposition: A | Discharge status: Home / Self Care | Payer: Self-pay | Attending: Emergency Medicine | Admitting: Emergency Medicine

## 2013-08-04 DIAGNOSIS — F329 Major depressive disorder, single episode, unspecified: Secondary | ICD-10-CM | POA: Insufficient documentation

## 2013-08-04 DIAGNOSIS — F3289 Other specified depressive episodes: Secondary | ICD-10-CM | POA: Insufficient documentation

## 2013-08-04 DIAGNOSIS — F411 Generalized anxiety disorder: Secondary | ICD-10-CM | POA: Insufficient documentation

## 2013-08-04 DIAGNOSIS — G40909 Epilepsy, unspecified, not intractable, without status epilepticus: Secondary | ICD-10-CM | POA: Insufficient documentation

## 2013-08-04 DIAGNOSIS — Z79899 Other long term (current) drug therapy: Secondary | ICD-10-CM | POA: Insufficient documentation

## 2013-08-04 DIAGNOSIS — Y9389 Activity, other specified: Secondary | ICD-10-CM | POA: Insufficient documentation

## 2013-08-04 DIAGNOSIS — S93409A Sprain of unspecified ligament of unspecified ankle, initial encounter: Secondary | ICD-10-CM | POA: Insufficient documentation

## 2013-08-04 DIAGNOSIS — Y929 Unspecified place or not applicable: Secondary | ICD-10-CM | POA: Insufficient documentation

## 2013-08-04 DIAGNOSIS — R296 Repeated falls: Secondary | ICD-10-CM | POA: Insufficient documentation

## 2013-08-04 MED ORDER — IBUPROFEN 800 MG PO TABS
800.0000 mg | ORAL_TABLET | Freq: Once | ORAL | Status: AC
  Administered 2013-08-04: 800 mg via ORAL
  Filled 2013-08-04: qty 1

## 2013-08-04 MED ORDER — HYDROCODONE-ACETAMINOPHEN 5-325 MG PO TABS: 1.0000 | ORAL_TABLET | Freq: Four times a day (QID) | ORAL | Status: DC | PRN

## 2013-08-04 MED ORDER — IBUPROFEN 800 MG PO TABS: 800.0000 mg | ORAL_TABLET | Freq: Three times a day (TID) | ORAL | Status: DC | PRN

## 2013-08-04 NOTE — Progress Notes (Signed)
[facility] CL provided pt with a [employer] Orange Card application. Patient stated that she had heard about program through [facility]. Patient stated that [facility] [place] [facility].

## 2013-08-04 NOTE — ED Notes (Addendum)
Pt reports having a witnessed fall during a seizure on Wednesday. Pt states she was told that she did not hit her head. Pt has a history of seizures, pt denies any seizure activity since Wednesday. Pt states she was reluctant to come in but her left ankle and knee has hurt since the fall and her significant other wanted her left leg to be checked. Pt is A/O x4 in NAD.

## 2013-08-04 NOTE — Discharge Instructions (Signed)
Return here as needed. Ice and elevate the ankle and foot.

## 2013-08-04 NOTE — Progress Notes (Signed)
08/04/2013 A. Bennie DallasFerrero RNCM 8295AO2034pm [facility] finally reached patient at [phone].  Patient in need of her seizure medications, patient without insurance and without pcp.  EDCM reviewed seizure medications and dosages with patient.  Patient confirms correct medications and correct dosages.  EDPA who saw patient earlier in the day wrote RX's for the following medications ventolin inhaler, depakote 250mg  DR tablet, dilantin 100mg  er tablets, and depa kene 250mg  4 times daily. [facility] [address] per patient's request at fax number [phone].  MATCH program initiated.  Instructed patient that [facility].  Patient reports she will pick up the prescriptions on Sunday after church.  EDCM also provided patient with phone number and address of [facility].  Encouraged patient to call and make an appointment to have a pcp.  [facility] at 2027pm with confirmation of receipt at 2129pm.  [facility] and spoke with the pharmacist at 2053pm who reports she has received the [facility] and prescriptions.  Patient thankful for assistance.  No further EDCM needs at this time.

## 2013-08-04 NOTE — ED Provider Notes (Signed)
CSN: [id number]     Arrival date & time 08/04/13  1219 History   First MD Initiated Contact with Patient 08/04/13 1257     Chief Complaint  Patient presents with  . Leg Pain     (Consider location/radiation/quality/duration/timing/severity/associated sxs/prior Treatment) Patient is a 28 y.o. female presenting with leg pain.  Leg Pain  28 yo white female presents to the ED with chief complaint of left ankle pain. Patient had a seizure 2 days ago where she passed out and fell, injuring her ankle. Denies having hit her head or injuring any other portion of her body. Seizure was witnessed by her boyfriend. She has had seizures in the past and is trying to establish care here [place] from [place]. Patient describes a painful swollen ankle that is becoming difficult to walk on. The pain radiates on the top of her foot and lateral ankle when she walks. Denies wound and paresthesias.   Past Medical History  Diagnosis Date  . Seizures   . PTSD (post-traumatic stress disorder)   . Depression   . Anxiety   . Chronic diarrhea    Past Surgical History  Procedure Laterality Date  . Cesarean section    . Tubal ligation     No family history on file. History  Substance Use Topics  . Smoking status: Never Smoker   . Smokeless tobacco: Not on file  . Alcohol Use: No   OB History   Grav Para Term Preterm Abortions TAB SAB Ect Mult Living                 Review of Systems All other systems negative except as documented in the HPI. All pertinent positives and negatives as reviewed in the HPI.    Allergies  Sulfa antibiotics  Home Medications   Current Outpatient Rx  Name  Route  Sig  Dispense  Refill  . albuterol (PROVENTIL HFA;VENTOLIN HFA) 108 (90 BASE) MCG/ACT inhaler   Inhalation   Inhale 1-2 puffs into the lungs every 6 (six) hours as needed for wheezing or shortness of breath.         . divalproex (DEPAKOTE) 250 MG DR tablet   Oral   Take 250 mg by mouth  every 12 (twelve) hours.         Marland Kitchen. ibuprofen (ADVIL,MOTRIN) 800 MG tablet   Oral   Take 800 mg by mouth every 6 (six) hours as needed for moderate pain (pain).          . phenytoin (DILANTIN) 100 MG ER capsule   Oral   Take 400 mg by mouth at bedtime.          . traMADol (ULTRAM) 50 MG tablet   Oral   Take 50 mg by mouth every 4 (four) hours as needed (pain).         . traZODone (DESYREL) 50 MG tablet   Oral   Take 50 mg by mouth at bedtime.          . valproic acid (DEPAKENE) 250 MG capsule   Oral   Take 250 mg by mouth 4 (four) times daily.          BP 111/60  Pulse 79  Temp(Src) 98.1 F (36.7 C) (Oral)  Resp 18  SpO2 100%  LMP 07/03/2013 Physical Exam  Constitutional: She is oriented to person, place, and time. She appears well-developed and well-nourished. No distress.  HENT:  Head: Normocephalic and atraumatic.  Eyes: Pupils are equal,  round, and reactive to light.  Neck: Normal range of motion. Neck supple.  Pulmonary/Chest: Effort normal.  Musculoskeletal:       Right ankle: Normal. She exhibits normal range of motion, no swelling and no deformity. No tenderness. Achilles tendon normal.       Left ankle: She exhibits decreased range of motion and swelling. She exhibits no ecchymosis, no deformity and normal pulse. Tenderness. Lateral malleolus tenderness found. No medial malleolus tenderness found. Achilles tendon normal.  Neurological: She is alert and oriented to person, place, and time.  Skin: Skin is warm and dry. She is not diaphoretic.    ED Course  Procedures (including critical care time)  The patient is referred to ortho. Told to return here as needed.   Carlyle Dolly, PA-C 08/04/13 1942

## 2013-08-04 NOTE — Progress Notes (Signed)
   CARE MANAGEMENT ED NOTE 08/04/2013  Patient:  Patricia Drake,Patricia Drake   Account Number:  [id number]  Date Initiated:  08/04/2013  Documentation initiated by:  Radford PaxFERRERO,Yousof Alderman  Subjective/Objective Assessment:   Patient presents to Ed post fall with ankle pain     Subjective/Objective Assessment Detail:     Action/Plan:   Action/Plan Detail:   Anticipated DC Date:  08/04/2013     Status Recommendation to Physician:   Result of Recommendation:    Other ED Services  Consult Working Plan    DC Planning Services  Other  Medication Assistance    Choice offered to / List presented to:            Status of service:  Completed, signed off  ED Comments:   ED Comments Detail:  EDCM contacted by Luanna SalkJackie Perkins, medical examiner at [facility], rapid response team regarding medication assistance for this patient.  [facility]. Patient given prescriptions for Motrin 800mg  and Hydrocone tablets.  As per Ms. Perkins, patient is having difficulty paying for her medications.  [facility] informed Ms. Julien Girterkins that the [facility] at [facility] does not apply to over the counter medications and narcotics.  [facility] who reported medications would cost $17.25.  EDCM called patient at [phone]( left voice mssage) and informed the patient of above.  Also informed patient to go to [employer] or local churches to procure funds for her medications.  EDCM left phone number for call back if patient had any questions. [facility] rep has spoken to this patient todayand was provided information about the orange card. No further EDCM needs at this time.

## 2013-08-07 NOTE — ED Provider Notes (Signed)
Medical screening examination/treatment/procedure(s) were performed by non-physician practitioner and as supervising physician I was immediately available for consultation/collaboration.  EKG Interpretation   None         Joah Patlan, MD 08/07/13 0746 

## 2013-11-10 DIAGNOSIS — F4310 Post-traumatic stress disorder, unspecified: Secondary | ICD-10-CM | POA: Insufficient documentation

## 2013-11-10 DIAGNOSIS — G40909 Epilepsy, unspecified, not intractable, without status epilepticus: Secondary | ICD-10-CM | POA: Insufficient documentation

## 2013-11-10 DIAGNOSIS — Z79899 Other long term (current) drug therapy: Secondary | ICD-10-CM | POA: Insufficient documentation

## 2013-11-10 DIAGNOSIS — Z9851 Tubal ligation status: Secondary | ICD-10-CM | POA: Insufficient documentation

## 2013-11-10 DIAGNOSIS — Z8709 Personal history of other diseases of the respiratory system: Secondary | ICD-10-CM | POA: Insufficient documentation

## 2013-11-10 DIAGNOSIS — K2970 Gastritis, unspecified, without bleeding: Secondary | ICD-10-CM | POA: Insufficient documentation

## 2013-11-10 DIAGNOSIS — Z87891 Personal history of nicotine dependence: Secondary | ICD-10-CM | POA: Insufficient documentation

## 2013-11-10 DIAGNOSIS — F329 Major depressive disorder, single episode, unspecified: Secondary | ICD-10-CM | POA: Insufficient documentation

## 2013-11-10 DIAGNOSIS — Z3202 Encounter for pregnancy test, result negative: Secondary | ICD-10-CM | POA: Insufficient documentation

## 2013-11-10 DIAGNOSIS — F3289 Other specified depressive episodes: Secondary | ICD-10-CM | POA: Insufficient documentation

## 2013-11-10 DIAGNOSIS — F411 Generalized anxiety disorder: Secondary | ICD-10-CM | POA: Insufficient documentation

## 2013-11-10 DIAGNOSIS — K2990 Gastroduodenitis, unspecified, without bleeding: Principal | ICD-10-CM

## 2013-11-10 NOTE — ED Notes (Signed)
Per EMS, pt began having RUQ ABD pain this morning. Pt also having nausea and diarrhea.

## 2013-11-11 ENCOUNTER — Emergency Department (HOSPITAL_COMMUNITY): Payer: Self-pay

## 2013-11-11 ENCOUNTER — Encounter (HOSPITAL_COMMUNITY): Payer: Self-pay | Admitting: Emergency Medicine

## 2013-11-11 ENCOUNTER — Emergency Department (HOSPITAL_COMMUNITY)
Admission: EM | Admit: 2013-11-11 | Discharge: 2013-11-11 | Disposition: A | Discharge status: Home / Self Care | Payer: Self-pay | Attending: Emergency Medicine | Admitting: Emergency Medicine

## 2013-11-11 DIAGNOSIS — K2970 Gastritis, unspecified, without bleeding: Secondary | ICD-10-CM

## 2013-11-11 HISTORY — DX: Other pulmonary collapse: J98.19

## 2013-11-11 LAB — URINALYSIS, ROUTINE W REFLEX MICROSCOPIC
BILIRUBIN URINE: NEGATIVE
GLUCOSE, UA: NEGATIVE mg/dL
Hgb urine dipstick: NEGATIVE
Ketones, ur: NEGATIVE mg/dL
Leukocytes, UA: NEGATIVE
Nitrite: NEGATIVE
PH: 5.5 (ref 5.0–8.0)
Protein, ur: NEGATIVE mg/dL
Specific Gravity, Urine: 1.026 (ref 1.005–1.030)
Urobilinogen, UA: 0.2 mg/dL (ref 0.0–1.0)

## 2013-11-11 LAB — LIPASE, BLOOD: Lipase: 17 U/L (ref 11–59)

## 2013-11-11 LAB — CBC WITH DIFFERENTIAL/PLATELET
BASOS ABS: 0.1 10*3/uL (ref 0.0–0.1)
BASOS PCT: 1 % (ref 0–1)
EOS ABS: 0.6 10*3/uL (ref 0.0–0.7)
Eosinophils Relative: 6 % — ABNORMAL HIGH (ref 0–5)
HCT: 36.1 % (ref 36.0–46.0)
HEMOGLOBIN: 12.4 g/dL (ref 12.0–15.0)
Lymphocytes Relative: 33 % (ref 12–46)
Lymphs Abs: 2.9 10*3/uL (ref 0.7–4.0)
MCH: 30.8 pg (ref 26.0–34.0)
MCHC: 34.3 g/dL (ref 30.0–36.0)
MCV: 89.8 fL (ref 78.0–100.0)
Monocytes Absolute: 0.6 10*3/uL (ref 0.1–1.0)
Monocytes Relative: 7 % (ref 3–12)
NEUTROS ABS: 4.7 10*3/uL (ref 1.7–7.7)
Neutrophils Relative %: 53 % (ref 43–77)
PLATELETS: 388 10*3/uL (ref 150–400)
RBC: 4.02 MIL/uL (ref 3.87–5.11)
RDW: 12.3 % (ref 11.5–15.5)
WBC: 8.8 10*3/uL (ref 4.0–10.5)

## 2013-11-11 LAB — COMPREHENSIVE METABOLIC PANEL
ALBUMIN: 3.1 g/dL — AB (ref 3.5–5.2)
ALK PHOS: 62 U/L (ref 39–117)
ALT: 23 U/L (ref 0–35)
AST: 18 U/L (ref 0–37)
BUN: 8 mg/dL (ref 6–23)
CHLORIDE: 107 meq/L (ref 96–112)
CO2: 22 mEq/L (ref 19–32)
Calcium: 8.1 mg/dL — ABNORMAL LOW (ref 8.4–10.5)
Creatinine, Ser: 0.52 mg/dL (ref 0.50–1.10)
GFR calc Af Amer: >90 mL/min (ref >90)
GFR calc non Af Amer: >90 mL/min (ref >90)
Glucose, Bld: 97 mg/dL (ref 70–99)
POTASSIUM: 4 meq/L (ref 3.7–5.3)
SODIUM: 138 meq/L (ref 137–147)
TOTAL PROTEIN: 6.5 g/dL (ref 6.0–8.3)

## 2013-11-11 LAB — PREGNANCY, URINE: Preg Test, Ur: NEGATIVE

## 2013-11-11 MED ORDER — SODIUM CHLORIDE 0.9 % IV BOLUS (SEPSIS)
1000.0000 mL | Freq: Once | INTRAVENOUS | Status: AC
  Administered 2013-11-11: 1000 mL via INTRAVENOUS

## 2013-11-11 MED ORDER — LANSOPRAZOLE 30 MG PO CPDR: 30.0000 mg | DELAYED_RELEASE_CAPSULE | Freq: Every day | ORAL | Status: DC

## 2013-11-11 MED ORDER — MORPHINE SULFATE 4 MG/ML IJ SOLN
4.0000 mg | Freq: Once | INTRAMUSCULAR | Status: AC
  Administered 2013-11-11: 4 mg via INTRAVENOUS
  Filled 2013-11-11: qty 1

## 2013-11-11 MED ORDER — GI COCKTAIL ~~LOC~~
30.0000 mL | Freq: Once | ORAL | Status: AC
  Administered 2013-11-11: 30 mL via ORAL
  Filled 2013-11-11: qty 30

## 2013-11-11 MED ORDER — ONDANSETRON HCL 4 MG/2ML IJ SOLN
4.0000 mg | Freq: Once | INTRAMUSCULAR | Status: AC
  Administered 2013-11-11: 4 mg via INTRAVENOUS
  Filled 2013-11-11: qty 2

## 2013-11-11 MED ORDER — ONDANSETRON 4 MG PO TBDP: ORAL_TABLET | ORAL | Status: DC

## 2013-11-11 NOTE — ED Notes (Signed)
Pt states that she began feeling nauseous this morning and started having vomiting and diarrhea around noon today. Pt states that her neighbors son was vomiting a few days ago.

## 2013-11-11 NOTE — ED Notes (Signed)
Attempted to start IV x 3. Finally obtained IV access but unable to get blood draw completed.  Requested phlebotomy to try obtaining blood samples.

## 2013-11-11 NOTE — Discharge Instructions (Signed)

## 2013-11-11 NOTE — ED Provider Notes (Signed)
CSN: [id number]     Arrival date & time 11/10/13  2354 History   First MD Initiated Contact with Patient 11/11/13 0009     Chief Complaint  Patient presents with  . Abdominal Pain     (Consider location/radiation/quality/duration/timing/severity/associated sxs/prior Treatment) HPI Patient presents with one day of upper abdominal pain that worsened this evening around 9 PM. The pain is mostly in her epigastric and right upper quadrants. Is associated with nausea and 2 episodes of vomiting. Patient states she's also had watery diarrhea throughout the day. She's had subjective fevers and chills. She denies any urinary symptoms, vaginal bleeding or discharge.  Past Medical History  Diagnosis Date  . Seizures   . PTSD (post-traumatic stress disorder)   . Depression   . Anxiety   . Chronic diarrhea   . Collapsed lung    Past Surgical History  Procedure Laterality Date  . Cesarean section    . Tubal ligation     No family history on file. History  Substance Use Topics  . Smoking status: Former Smoker    Quit date: 11/11/2012  . Smokeless tobacco: Never Used  . Alcohol Use: No   OB History   Grav Para Term Preterm Abortions TAB SAB Ect Mult Living                 Review of Systems  Constitutional: Negative for fever and chills.  Respiratory: Negative for cough and shortness of breath.   Cardiovascular: Negative for chest pain.  Gastrointestinal: Positive for nausea, vomiting, abdominal pain and diarrhea. Negative for constipation and blood in stool.  Genitourinary: Negative for dysuria, frequency, flank pain, vaginal bleeding, vaginal discharge and pelvic pain.  Musculoskeletal: Negative for back pain, myalgias, neck pain and neck stiffness.  Skin: Negative for rash and wound.  Neurological: Negative for dizziness, weakness, light-headedness, numbness and headaches.  All other systems reviewed and are negative.     Allergies  Sulfa antibiotics  Home Medications    Prior to Admission medications   Medication Sig Start Date End Date Taking? Authorizing Provider  albuterol (PROVENTIL HFA;VENTOLIN HFA) 108 (90 BASE) MCG/ACT inhaler Inhale 1-2 puffs into the lungs every 6 (six) hours as needed for wheezing or shortness of breath.   Yes Historical Provider, MD  divalproex (DEPAKOTE) 250 MG DR tablet Take 250 mg by mouth every 12 (twelve) hours.   Yes Historical Provider, MD  phenytoin (DILANTIN) 100 MG ER capsule Take 400 mg by mouth at bedtime.    Yes Historical Provider, MD  traMADol (ULTRAM) 50 MG tablet Take 50 mg by mouth every 4 (four) hours as needed (pain).   Yes Historical Provider, MD  traZODone (DESYREL) 50 MG tablet Take 50 mg by mouth at bedtime.    Yes Historical Provider, MD  valproic acid (DEPAKENE) 250 MG capsule Take 250 mg by mouth 4 (four) times daily.   Yes Historical Provider, MD   BP 140/96  Pulse 77  Temp(Src) 98.6 F (37 C) (Oral)  Resp 12  Ht 5\' 2"  (1.575 m)  Wt 230 lb (104.327 kg)  BMI 42.06 kg/m2  SpO2 97%  LMP 10/25/2013 Physical Exam  Nursing note and vitals reviewed. Constitutional: She is oriented to person, place, and time. She appears well-developed and well-nourished. No distress.  HENT:  Head: Normocephalic and atraumatic.  Mouth/Throat: Oropharynx is clear and moist.  Eyes: EOM are normal. Pupils are equal, round, and reactive to light.  Neck: Normal range of motion. Neck supple.  Cardiovascular: Normal  rate and regular rhythm.   Pulmonary/Chest: Effort normal and breath sounds normal. No respiratory distress. She has no wheezes. She has no rales.  Abdominal: Soft. Bowel sounds are normal. She exhibits no distension and no mass. There is tenderness (tenderness to palpation in the epigastric and upper quadrant. There is no rebound or guarding.). There is no rebound and no guarding.  Musculoskeletal: Normal range of motion. She exhibits no edema and no tenderness.  No CVA tenderness bilaterally.  Neurological:  She is alert and oriented to person, place, and time.  Moves all extremities. Sensation is grossly intact.  Skin: Skin is warm and dry. No rash noted. No erythema.  Psychiatric: She has a normal mood and affect. Her behavior is normal.    ED Course  Procedures (including critical care time) Labs Review Labs Reviewed  CBC WITH DIFFERENTIAL  COMPREHENSIVE METABOLIC PANEL  LIPASE, BLOOD  URINALYSIS, ROUTINE W REFLEX MICROSCOPIC  PREGNANCY, URINE    Imaging Review No results found.   EKG Interpretation None      MDM   Final diagnoses:  None   Ultrasound without evidence of any gallbladder disease. Labs within normal limits. Patient has been more active vomiting in the emergency department. She is feeling better after pain medication. Suspect gastritis/gastroenteritis. Return precautions given. Advised to followup with gastroenterologist should her symptoms persist     Loren Raceravid Euva Rundell, MD 11/11/13 [id number]

## 2014-01-07 ENCOUNTER — Emergency Department (HOSPITAL_COMMUNITY): Payer: Self-pay

## 2014-01-07 ENCOUNTER — Encounter (HOSPITAL_COMMUNITY): Payer: Self-pay | Admitting: Emergency Medicine

## 2014-01-07 ENCOUNTER — Emergency Department (HOSPITAL_COMMUNITY)
Admission: EM | Admit: 2014-01-07 | Discharge: 2014-01-07 | Disposition: A | Discharge status: Home / Self Care | Payer: Self-pay | Attending: Emergency Medicine | Admitting: Emergency Medicine

## 2014-01-07 DIAGNOSIS — F411 Generalized anxiety disorder: Secondary | ICD-10-CM | POA: Insufficient documentation

## 2014-01-07 DIAGNOSIS — S40021A Contusion of right upper arm, initial encounter: Secondary | ICD-10-CM

## 2014-01-07 DIAGNOSIS — S0993XA Unspecified injury of face, initial encounter: Secondary | ICD-10-CM | POA: Insufficient documentation

## 2014-01-07 DIAGNOSIS — S40029A Contusion of unspecified upper arm, initial encounter: Secondary | ICD-10-CM | POA: Insufficient documentation

## 2014-01-07 DIAGNOSIS — Z8709 Personal history of other diseases of the respiratory system: Secondary | ICD-10-CM | POA: Insufficient documentation

## 2014-01-07 DIAGNOSIS — Z79899 Other long term (current) drug therapy: Secondary | ICD-10-CM | POA: Insufficient documentation

## 2014-01-07 DIAGNOSIS — G40909 Epilepsy, unspecified, not intractable, without status epilepticus: Secondary | ICD-10-CM | POA: Insufficient documentation

## 2014-01-07 DIAGNOSIS — S069X9A Unspecified intracranial injury with loss of consciousness of unspecified duration, initial encounter: Secondary | ICD-10-CM

## 2014-01-07 DIAGNOSIS — F3289 Other specified depressive episodes: Secondary | ICD-10-CM | POA: Insufficient documentation

## 2014-01-07 DIAGNOSIS — S199XXA Unspecified injury of neck, initial encounter: Secondary | ICD-10-CM

## 2014-01-07 DIAGNOSIS — IMO0002 Reserved for concepts with insufficient information to code with codable children: Secondary | ICD-10-CM | POA: Insufficient documentation

## 2014-01-07 DIAGNOSIS — F329 Major depressive disorder, single episode, unspecified: Secondary | ICD-10-CM | POA: Insufficient documentation

## 2014-01-07 DIAGNOSIS — S298XXA Other specified injuries of thorax, initial encounter: Secondary | ICD-10-CM | POA: Insufficient documentation

## 2014-01-07 DIAGNOSIS — S060X9A Concussion with loss of consciousness of unspecified duration, initial encounter: Secondary | ICD-10-CM | POA: Insufficient documentation

## 2014-01-07 DIAGNOSIS — R1011 Right upper quadrant pain: Secondary | ICD-10-CM

## 2014-01-07 LAB — BASIC METABOLIC PANEL
Anion gap: 12 (ref 5–15)
BUN: 9 mg/dL (ref 6–23)
CALCIUM: 8.6 mg/dL (ref 8.4–10.5)
CO2: 22 mEq/L (ref 19–32)
CREATININE: 0.57 mg/dL (ref 0.50–1.10)
Chloride: 107 mEq/L (ref 96–112)
GFR calc Af Amer: >90 mL/min (ref >90)
GLUCOSE: 90 mg/dL (ref 70–99)
Potassium: 4.2 mEq/L (ref 3.7–5.3)
Sodium: 141 mEq/L (ref 137–147)

## 2014-01-07 LAB — CBC
HEMATOCRIT: 40.1 % (ref 36.0–46.0)
Hemoglobin: 13.7 g/dL (ref 12.0–15.0)
MCH: 30.8 pg (ref 26.0–34.0)
MCHC: 34.2 g/dL (ref 30.0–36.0)
MCV: 90.1 fL (ref 78.0–100.0)
Platelets: 385 10*3/uL (ref 150–400)
RBC: 4.45 MIL/uL (ref 3.87–5.11)
RDW: 12.8 % (ref 11.5–15.5)
WBC: 10.1 10*3/uL (ref 4.0–10.5)

## 2014-01-07 LAB — POC URINE PREG, ED: Preg Test, Ur: NEGATIVE

## 2014-01-07 MED ORDER — HYDROMORPHONE HCL PF 1 MG/ML IJ SOLN
0.5000 mg | Freq: Once | INTRAMUSCULAR | Status: AC
  Administered 2014-01-07: 0.5 mg via INTRAVENOUS
  Filled 2014-01-07: qty 1

## 2014-01-07 MED ORDER — MORPHINE SULFATE 4 MG/ML IJ SOLN
4.0000 mg | Freq: Once | INTRAMUSCULAR | Status: AC
  Administered 2014-01-07: 4 mg via INTRAVENOUS
  Filled 2014-01-07: qty 1

## 2014-01-07 MED ORDER — SODIUM CHLORIDE 0.9 % IV BOLUS (SEPSIS)
1000.0000 mL | Freq: Once | INTRAVENOUS | Status: AC
  Administered 2014-01-07: 1000 mL via INTRAVENOUS

## 2014-01-07 MED ORDER — IOHEXOL 300 MG/ML  SOLN
100.0000 mL | Freq: Once | INTRAMUSCULAR | Status: AC | PRN
  Administered 2014-01-07: 100 mL via INTRAVENOUS

## 2014-01-07 NOTE — ED Notes (Signed)
Patient still off the unit for testing 
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
## 2014-01-07 NOTE — ED Notes (Signed)
Explained to the patient that BP will be monitored and a stable level before receiving pain medication.

## 2014-01-07 NOTE — ED Notes (Signed)
Reported BP 88/57 to Dr. Jodi MourningZavitz after pain medication.  MD acknowledges, no new orders at this time.  Patient given water per discussion of plan of care.
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
## 2014-01-07 NOTE — ED Notes (Signed)
Informed Dr. Jodi MourningZavitz that patient has BP of 86/49 with multiple missed attempts at IV starts.  MD acknowledges and will see patient soon. Ultrasound at the bedside.

## 2014-01-07 NOTE — ED Notes (Signed)
Discussed need to void for urine sample 

## 2014-01-07 NOTE — ED Notes (Signed)
Dr. Zavitz at the bedside.  

## 2014-01-07 NOTE — Discharge Instructions (Signed)
If you were given medicines take as directed.  If you are on coumadin or contraceptives realize their levels and effectiveness is altered by many different medicines.  If you have any reaction (rash, tongues swelling, other) to the medicines stop taking and see a physician.   Please follow up as directed and return to the ER or see a physician for new or worsening symptoms.  Thank you. Filed Vitals:   01/07/14 0445 01/07/14 0515 01/07/14 0645 01/07/14 0715  BP: 95/56 99/54 106/65 88/57  Pulse: 73 97 65 68  Temp:      TempSrc:      Resp: 19 23 16 21   Height:      Weight:      SpO2: 98% 97% 98% 96%

## 2014-01-07 NOTE — ED Notes (Signed)
Reported to Dr. Jodi MourningZavitz that patient is complaining of pain, MD acknowledges, and orders pain medication.

## 2014-04-12 ENCOUNTER — Encounter (HOSPITAL_COMMUNITY): Payer: Self-pay | Admitting: Emergency Medicine

## 2014-04-12 ENCOUNTER — Emergency Department (HOSPITAL_COMMUNITY)
Admission: EM | Admit: 2014-04-12 | Discharge: 2014-04-12 | Disposition: A | Discharge status: Home / Self Care | Payer: Self-pay | Attending: Emergency Medicine | Admitting: Emergency Medicine

## 2014-04-12 DIAGNOSIS — Z87891 Personal history of nicotine dependence: Secondary | ICD-10-CM | POA: Insufficient documentation

## 2014-04-12 DIAGNOSIS — F419 Anxiety disorder, unspecified: Secondary | ICD-10-CM | POA: Insufficient documentation

## 2014-04-12 DIAGNOSIS — G40909 Epilepsy, unspecified, not intractable, without status epilepticus: Secondary | ICD-10-CM | POA: Insufficient documentation

## 2014-04-12 DIAGNOSIS — Z79899 Other long term (current) drug therapy: Secondary | ICD-10-CM | POA: Insufficient documentation

## 2014-04-12 DIAGNOSIS — K088 Other specified disorders of teeth and supporting structures: Secondary | ICD-10-CM | POA: Insufficient documentation

## 2014-04-12 DIAGNOSIS — K0889 Other specified disorders of teeth and supporting structures: Secondary | ICD-10-CM

## 2014-04-12 DIAGNOSIS — Z8709 Personal history of other diseases of the respiratory system: Secondary | ICD-10-CM | POA: Insufficient documentation

## 2014-04-12 DIAGNOSIS — F329 Major depressive disorder, single episode, unspecified: Secondary | ICD-10-CM | POA: Insufficient documentation

## 2014-04-12 MED ORDER — AMOXICILLIN 500 MG PO CAPS: 500.0000 mg | ORAL_CAPSULE | Freq: Three times a day (TID) | ORAL | Status: DC

## 2014-04-12 MED ORDER — HYDROCODONE-ACETAMINOPHEN 5-325 MG PO TABS
2.0000 | ORAL_TABLET | Freq: Once | ORAL | Status: AC
  Administered 2014-04-12: 2 via ORAL
  Filled 2014-04-12: qty 2

## 2014-04-12 MED ORDER — HYDROCODONE-ACETAMINOPHEN 5-325 MG PO TABS: 2.0000 | ORAL_TABLET | ORAL | Status: DC | PRN

## 2014-04-12 NOTE — ED Notes (Signed)
Pt has pain to her right lower jaw and back molar that started last night. Pain radiates up her jaw to her ear.

## 2014-04-12 NOTE — Discharge Instructions (Signed)

## 2014-04-12 NOTE — ED Provider Notes (Signed)
Medical screening examination/treatment/procedure(s) were performed by non-physician practitioner and as supervising physician I was immediately available for consultation/collaboration.  Kyley Solow L Carmen Tolliver, MD 04/12/14 1723 

## 2014-04-12 NOTE — ED Provider Notes (Signed)
CSN: [id number]     Arrival date & time 04/12/14  0840 History   None    No chief complaint on file.    (Consider location/radiation/quality/duration/timing/severity/associated sxs/prior Treatment) Patient is a 28 y.o. female presenting with tooth pain. The history is provided by the patient. No language interpreter was used.  Dental Pain Location:  Lower Lower teeth location:  18/LL 2nd molar Quality:  Aching and burning Severity:  Moderate Onset quality:  Gradual Duration:  2 days Timing:  Constant Progression:  Worsening Chronicity:  New Previous work-up:  Dental exam Relieved by:  Nothing Worsened by:  Nothing tried Ineffective treatments:  None tried Associated symptoms: no congestion   Pt reports unable to get tooth pulled dentist charges 200. Dollars   Past Medical History  Diagnosis Date  . Seizures   . PTSD (post-traumatic stress disorder)   . Depression   . Anxiety   . Chronic diarrhea   . Collapsed lung    Past Surgical History  Procedure Laterality Date  . Cesarean section    . Tubal ligation     No family history on file. History  Substance Use Topics  . Smoking status: Former Smoker    Quit date: 11/11/2012  . Smokeless tobacco: Never Used  . Alcohol Use: No   OB History   Grav Para Term Preterm Abortions TAB SAB Ect Mult Living                 Review of Systems  HENT: Negative for congestion.   All other systems reviewed and are negative.     Allergies  Sulfa antibiotics  Home Medications   Prior to Admission medications   Medication Sig Start Date End Date Taking? Authorizing Provider  albuterol (PROVENTIL HFA;VENTOLIN HFA) 108 (90 BASE) MCG/ACT inhaler Inhale 1-2 puffs into the lungs every 6 (six) hours as needed for wheezing or shortness of breath.    Historical Provider, MD  divalproex (DEPAKOTE) 250 MG DR tablet Take 250 mg by mouth every 12 (twelve) hours.    Historical Provider, MD  phenytoin (DILANTIN) 100 MG ER capsule  Take 400 mg by mouth at bedtime.     Historical Provider, MD  traMADol (ULTRAM) 50 MG tablet Take 50 mg by mouth every 4 (four) hours as needed (pain).    Historical Provider, MD  traZODone (DESYREL) 50 MG tablet Take 50 mg by mouth at bedtime.     Historical Provider, MD   There were no vitals taken for this visit. Physical Exam  Nursing note and vitals reviewed. Constitutional: She is oriented to person, place, and time. She appears well-developed and well-nourished.  HENT:  Head: Normocephalic.  Decay left lower 2nd molar  Eyes: EOM are normal. Pupils are equal, round, and reactive to light.  Neck: Normal range of motion.  Cardiovascular: Normal rate.   Pulmonary/Chest: Effort normal.  Abdominal: She exhibits no distension.  Musculoskeletal: Normal range of motion.  Neurological: She is alert and oriented to person, place, and time.  Psychiatric: She has a normal mood and affect.    ED Course  Procedures (including critical care time) Labs Review Labs Reviewed - No data to display  Imaging Review No results found.   EKG Interpretation None      MDM   Final diagnoses:  Toothache    farless referral Amoxicillian Hydrocodone    Elson AreasLeslie K Sofia, PA-C 04/12/14 [phone]

## 2014-04-16 NOTE — Discharge Planning (Signed)
[facility] Specialist  Spoke to patient regarding primary care resources and [facility]. Patients [facility] card recently expired, [facility] [place]. New orange card application given, pt instructed to take the completed application to her pcp for orange card renewal. Resource guide and my contact information provided for any future questions or concerns. No other needs identified at this time.

## 2014-05-15 ENCOUNTER — Encounter (HOSPITAL_COMMUNITY): Payer: Self-pay

## 2014-05-15 ENCOUNTER — Emergency Department (HOSPITAL_COMMUNITY): Payer: Self-pay

## 2014-05-15 ENCOUNTER — Emergency Department (HOSPITAL_COMMUNITY)
Admission: EM | Admit: 2014-05-15 | Discharge: 2014-05-15 | Disposition: A | Discharge status: Home / Self Care | Payer: Self-pay | Attending: Emergency Medicine | Admitting: Emergency Medicine

## 2014-05-15 DIAGNOSIS — Z79899 Other long term (current) drug therapy: Secondary | ICD-10-CM | POA: Insufficient documentation

## 2014-05-15 DIAGNOSIS — F419 Anxiety disorder, unspecified: Secondary | ICD-10-CM | POA: Insufficient documentation

## 2014-05-15 DIAGNOSIS — Z3202 Encounter for pregnancy test, result negative: Secondary | ICD-10-CM | POA: Insufficient documentation

## 2014-05-15 DIAGNOSIS — R102 Pelvic and perineal pain: Secondary | ICD-10-CM

## 2014-05-15 DIAGNOSIS — R1110 Vomiting, unspecified: Secondary | ICD-10-CM | POA: Insufficient documentation

## 2014-05-15 DIAGNOSIS — Z8709 Personal history of other diseases of the respiratory system: Secondary | ICD-10-CM | POA: Insufficient documentation

## 2014-05-15 DIAGNOSIS — N938 Other specified abnormal uterine and vaginal bleeding: Secondary | ICD-10-CM | POA: Insufficient documentation

## 2014-05-15 DIAGNOSIS — Z87891 Personal history of nicotine dependence: Secondary | ICD-10-CM | POA: Insufficient documentation

## 2014-05-15 DIAGNOSIS — G40909 Epilepsy, unspecified, not intractable, without status epilepticus: Secondary | ICD-10-CM | POA: Insufficient documentation

## 2014-05-15 DIAGNOSIS — F329 Major depressive disorder, single episode, unspecified: Secondary | ICD-10-CM | POA: Insufficient documentation

## 2014-05-15 LAB — CBC WITH DIFFERENTIAL/PLATELET
BASOS ABS: 0 10*3/uL (ref 0.0–0.1)
BASOS PCT: 0 % (ref 0–1)
Eosinophils Absolute: 0.1 10*3/uL (ref 0.0–0.7)
Eosinophils Relative: 1 % (ref 0–5)
HCT: 39.6 % (ref 36.0–46.0)
Hemoglobin: 13.9 g/dL (ref 12.0–15.0)
Lymphocytes Relative: 17 % (ref 12–46)
Lymphs Abs: 1.9 10*3/uL (ref 0.7–4.0)
MCH: 31.6 pg (ref 26.0–34.0)
MCHC: 35.1 g/dL (ref 30.0–36.0)
MCV: 90 fL (ref 78.0–100.0)
Monocytes Absolute: 0.6 10*3/uL (ref 0.1–1.0)
Monocytes Relative: 5 % (ref 3–12)
NEUTROS ABS: 8.4 10*3/uL — AB (ref 1.7–7.7)
NEUTROS PCT: 77 % (ref 43–77)
Platelets: 363 10*3/uL (ref 150–400)
RBC: 4.4 MIL/uL (ref 3.87–5.11)
RDW: 12.7 % (ref 11.5–15.5)
WBC: 10.9 10*3/uL — ABNORMAL HIGH (ref 4.0–10.5)

## 2014-05-15 LAB — COMPREHENSIVE METABOLIC PANEL
ALK PHOS: 59 U/L (ref 39–117)
ALT: 28 U/L (ref 0–35)
ANION GAP: 11 (ref 5–15)
AST: 24 U/L (ref 0–37)
Albumin: 3.2 g/dL — ABNORMAL LOW (ref 3.5–5.2)
BILIRUBIN TOTAL: 0.3 mg/dL (ref 0.3–1.2)
BUN: 7 mg/dL (ref 6–23)
CHLORIDE: 103 meq/L (ref 96–112)
CO2: 22 mEq/L (ref 19–32)
Calcium: 8.6 mg/dL (ref 8.4–10.5)
Creatinine, Ser: 0.51 mg/dL (ref 0.50–1.10)
GFR calc non Af Amer: >90 mL/min (ref >90)
GLUCOSE: 100 mg/dL — AB (ref 70–99)
POTASSIUM: 4.1 meq/L (ref 3.7–5.3)
Sodium: 136 mEq/L — ABNORMAL LOW (ref 137–147)
TOTAL PROTEIN: 6.9 g/dL (ref 6.0–8.3)

## 2014-05-15 LAB — WET PREP, GENITAL
TRICH WET PREP: NONE SEEN
Yeast Wet Prep HPF POC: NONE SEEN

## 2014-05-15 LAB — URINALYSIS, ROUTINE W REFLEX MICROSCOPIC
Bilirubin Urine: NEGATIVE
Glucose, UA: NEGATIVE mg/dL
Ketones, ur: NEGATIVE mg/dL
Leukocytes, UA: NEGATIVE
NITRITE: NEGATIVE
Protein, ur: 30 mg/dL — AB
SPECIFIC GRAVITY, URINE: 1.026 (ref 1.005–1.030)
Urobilinogen, UA: 0.2 mg/dL (ref 0.0–1.0)
pH: 6 (ref 5.0–8.0)

## 2014-05-15 LAB — URINE MICROSCOPIC-ADD ON

## 2014-05-15 LAB — HIV ANTIBODY (ROUTINE TESTING W REFLEX): HIV 1&2 Ab, 4th Generation: NONREACTIVE

## 2014-05-15 LAB — POC URINE PREG, ED: PREG TEST UR: NEGATIVE

## 2014-05-15 LAB — RPR

## 2014-05-15 LAB — LIPASE, BLOOD: Lipase: 19 U/L (ref 11–59)

## 2014-05-15 MED ORDER — FENTANYL CITRATE 0.05 MG/ML IJ SOLN
100.0000 ug | Freq: Once | INTRAMUSCULAR | Status: AC
  Administered 2014-05-15: 100 ug via INTRAVENOUS
  Filled 2014-05-15: qty 2

## 2014-05-15 MED ORDER — IBUPROFEN 800 MG PO TABS: 800.0000 mg | ORAL_TABLET | Freq: Three times a day (TID) | ORAL | Status: DC

## 2014-05-15 MED ORDER — SODIUM CHLORIDE 0.9 % IV BOLUS (SEPSIS)
1000.0000 mL | Freq: Once | INTRAVENOUS | Status: AC
  Administered 2014-05-15: 1000 mL via INTRAVENOUS

## 2014-05-15 MED ORDER — HYDROMORPHONE HCL 1 MG/ML IJ SOLN
1.0000 mg | Freq: Once | INTRAMUSCULAR | Status: AC
  Administered 2014-05-15: 1 mg via INTRAVENOUS
  Filled 2014-05-15: qty 1

## 2014-05-15 NOTE — ED Notes (Signed)
MD at bedside. 

## 2014-05-15 NOTE — Discharge Instructions (Signed)
Abnormal Uterine Bleeding [facility] today to schedule an appointment. You should have a repeat ultrasound of your pelvic organs performed within the next 6-12 weeks. The ultrasound can be performed at the women's clinic. Take the medication prescribed as needed for pain Abnormal uterine bleeding can affect women at various stages in life, including teenagers, women in their reproductive years, pregnant women, and women who have reached menopause. Several kinds of uterine bleeding are considered abnormal, including:  Bleeding or spotting between periods.   Bleeding after sexual intercourse.   Bleeding that is heavier or more than normal.   Periods that last longer than usual.  Bleeding after menopause.  Many cases of abnormal uterine bleeding are minor and simple to treat, while others are more serious. Any type of abnormal bleeding should be evaluated by your health care provider. Treatment will depend on the cause of the bleeding. HOME CARE INSTRUCTIONS Monitor your condition for any changes. The following actions may help to alleviate any discomfort you are experiencing:  Avoid the use of tampons and douches as directed by your health care provider.  Change your pads frequently. You should get regular pelvic exams and Pap tests. Keep all follow-up appointments for diagnostic tests as directed by your health care provider.  SEEK MEDICAL CARE IF:   Your bleeding lasts more than 1 week.   You feel dizzy at times.  SEEK IMMEDIATE MEDICAL CARE IF:   You pass out.   You are changing pads every 15 to 30 minutes.   You have abdominal pain.  You have a fever.   You become sweaty or weak.   You are passing large blood clots from the vagina.   You start to feel nauseous and vomit. MAKE SURE YOU:   Understand these instructions.  Will watch your condition.  Will get help right away if you are not doing well or get worse. Document Released:  06/01/2005 Document Revised: 06/06/2013 Document Reviewed: 12/29/2012 [facility] Patient Information 2015 [place]. This information is not intended to replace advice given to you by your health care provider. Make sure you discuss any questions you have with your health care provider.

## 2014-05-15 NOTE — ED Provider Notes (Signed)
CSN: [id number]     Arrival date & time 05/15/14  [id number] History   First MD Initiated Contact with Patient 05/15/14 1009     Chief Complaint  Patient presents with  . Abdominal Pain  . Vaginal Bleeding     (Consider location/radiation/quality/duration/timing/severity/associated sxs/prior Treatment) HPI Complains of lower abdominal pain from suprapubic area to periumbilical area onset 2 AM today accompanied by vomiting approximate 6 episodes starting at 4 AM today. She presently denies nausea abdominal pain is cramping, severe. No treatment prior to coming here. Other associated symptoms include vaginal bleeding onset 2 weeks ago. Pain is worse when she stands improves with lying in a lateral decubitus position. No vaginal discharge. Last bowel movement yesterday, normal. No fever. No other associated symptoms Past Medical History  Diagnosis Date  . Seizures   . PTSD (post-traumatic stress disorder)   . Depression   . Anxiety   . Chronic diarrhea   . Collapsed lung    Past Surgical History  Procedure Laterality Date  . Cesarean section    . Tubal ligation     No family history on file. History  Substance Use Topics  . Smoking status: Former Smoker    Quit date: 11/11/2012  . Smokeless tobacco: Never Used  . Alcohol Use: No   OB History    No data available     Review of Systems  Constitutional: Negative.   HENT: Negative.   Respiratory: Negative.   Cardiovascular: Negative.   Gastrointestinal: Positive for vomiting and abdominal pain.  Genitourinary: Positive for vaginal bleeding.  Musculoskeletal: Negative.   Skin: Negative.   Neurological: Negative.   Psychiatric/Behavioral: Negative.   All other systems reviewed and are negative.     Allergies  Sulfa antibiotics  Home Medications   Prior to Admission medications   Medication Sig Start Date End Date Taking? Authorizing Provider  albuterol (PROVENTIL HFA;VENTOLIN HFA) 108 (90 BASE) MCG/ACT inhaler Inhale  1-2 puffs into the lungs every 6 (six) hours as needed for wheezing or shortness of breath.   Yes Historical Provider, MD  divalproex (DEPAKOTE) 250 MG DR tablet Take 250 mg by mouth every 12 (twelve) hours.   Yes Historical Provider, MD  phenytoin (DILANTIN) 100 MG ER capsule Take 400 mg by mouth at bedtime.    Yes Historical Provider, MD  traZODone (DESYREL) 50 MG tablet Take 50 mg by mouth at bedtime.    Yes Historical Provider, MD  amoxicillin (AMOXIL) 500 MG capsule Take 1 capsule (500 mg total) by mouth 3 (three) times daily. Patient not taking: Reported on 05/15/2014 04/12/14   Elson AreasLeslie K Sofia, PA-C  HYDROcodone-acetaminophen (NORCO/VICODIN) 5-325 MG per tablet Take 2 tablets by mouth every 4 (four) hours as needed. Patient not taking: Reported on 05/15/2014 04/12/14   Elson AreasLeslie K Sofia, PA-C   BP 131/73 mmHg  Pulse 73  Temp(Src) 98.2 F (36.8 C) (Oral)  Resp 22  Ht 5\' 2"  (1.575 m)  Wt 226 lb (102.513 kg)  BMI 41.33 kg/m2  SpO2 100%  LMP 04/29/2014 Physical Exam  Constitutional: She appears well-developed and well-nourished. She appears distressed.  Appears mildly uncomfortable  HENT:  Head: Normocephalic and atraumatic.  Eyes: Conjunctivae are normal. Pupils are equal, round, and reactive to light.  Neck: Neck supple. No tracheal deviation present. No thyromegaly present.  Cardiovascular: Normal rate and regular rhythm.   No murmur heard. Pulmonary/Chest: Effort normal and breath sounds normal.  Abdominal: Soft. Bowel sounds are normal. She exhibits no distension and no mass.  There is tenderness. There is no rebound and no guarding.  Obese, suprapubic tenderness  Genitourinary:  No external lesion. Dark blood in vaginal vault oozing from cervix. Cervical os is closed. No cervical motion tenderness. Positive left adnexal tenderness.  Musculoskeletal: Normal range of motion. She exhibits no edema or tenderness.  Neurological: She is alert. Coordination normal.  Skin: Skin is warm  and dry. No rash noted.  Psychiatric: She has a normal mood and affect.  Nursing note and vitals reviewed.   ED Course  Procedures (including critical care time) Labs Review Labs Reviewed - No data to display  Imaging Review No results found.   EKG Interpretation None     11:33 AM pain improved and well controlled after treatment with intravenous hydromorphone  2:35 PM patient resting comfortably after treatment with intravenous opioids. Results for orders placed or performed during the hospital encounter of 05/15/14  Wet prep, genital  Result Value Ref Range   Yeast Wet Prep HPF POC NONE SEEN NONE SEEN   Trich, Wet Prep NONE SEEN NONE SEEN   Clue Cells Wet Prep HPF POC FEW (A) NONE SEEN   WBC, Wet Prep HPF POC FEW (A) NONE SEEN  Urinalysis, Routine w reflex microscopic  Result Value Ref Range   Color, Urine YELLOW YELLOW   APPearance CLEAR CLEAR   Specific Gravity, Urine 1.026 1.005 - 1.030   pH 6.0 5.0 - 8.0   Glucose, UA NEGATIVE NEGATIVE mg/dL   Hgb urine dipstick LARGE (A) NEGATIVE   Bilirubin Urine NEGATIVE NEGATIVE   Ketones, ur NEGATIVE NEGATIVE mg/dL   Protein, ur 30 (A) NEGATIVE mg/dL   Urobilinogen, UA 0.2 0.0 - 1.0 mg/dL   Nitrite NEGATIVE NEGATIVE   Leukocytes, UA NEGATIVE NEGATIVE  Comprehensive metabolic panel  Result Value Ref Range   Sodium 136 (L) 137 - 147 mEq/L   Potassium 4.1 3.7 - 5.3 mEq/L   Chloride 103 96 - 112 mEq/L   CO2 22 19 - 32 mEq/L   Glucose, Bld 100 (H) 70 - 99 mg/dL   BUN 7 6 - 23 mg/dL   Creatinine, Ser 4.090.51 0.50 - 1.10 mg/dL   Calcium 8.6 8.4 - 81.110.5 mg/dL   Total Protein 6.9 6.0 - 8.3 g/dL   Albumin 3.2 (L) 3.5 - 5.2 g/dL   AST 24 0 - 37 U/L   ALT 28 0 - 35 U/L   Alkaline Phosphatase 59 39 - 117 U/L   Total Bilirubin 0.3 0.3 - 1.2 mg/dL   GFR calc non Af Amer >90 >90 mL/min   GFR calc Af Amer >90 >90 mL/min   Anion gap 11 5 - 15  CBC with Differential  Result Value Ref Range   WBC 10.9 (H) 4.0 - 10.5 K/uL   RBC 4.40  3.87 - 5.11 MIL/uL   Hemoglobin 13.9 12.0 - 15.0 g/dL   HCT 91.439.6 78.236.0 - 95.646.0 %   MCV 90.0 78.0 - 100.0 fL   MCH 31.6 26.0 - 34.0 pg   MCHC 35.1 30.0 - 36.0 g/dL   RDW 21.312.7 08.611.5 - 57.815.5 %   Platelets 363 150 - 400 K/uL   Neutrophils Relative % 77 43 - 77 %   Neutro Abs 8.4 (H) 1.7 - 7.7 K/uL   Lymphocytes Relative 17 12 - 46 %   Lymphs Abs 1.9 0.7 - 4.0 K/uL   Monocytes Relative 5 3 - 12 %   Monocytes Absolute 0.6 0.1 - 1.0 K/uL   Eosinophils Relative 1  0 - 5 %   Eosinophils Absolute 0.1 0.0 - 0.7 K/uL   Basophils Relative 0 0 - 1 %   Basophils Absolute 0.0 0.0 - 0.1 K/uL  Lipase, blood  Result Value Ref Range   Lipase 19 11 - 59 U/L  Urine microscopic-add on  Result Value Ref Range   Urine-Other FIELD OBSCURED BY RBC'S   POC urine preg, ED (not at [facility])  Result Value Ref Range   Preg Test, Ur NEGATIVE NEGATIVE   US Transvaginal Non-ob  05/15/2014   CLINICAL DATA:  Pelvic pain and menorrhagia  EXAM: TRANSABDOMINAL AND TRANSVAGINAL ULTRASOUND OF PELVIS  TECHNIQUE: Study was performed transabdominally to optimize pelvic field of view evaluation and transvaginally to optimize internal visceral architecture evaluation.  COMPARISON:  CT abdomen and pelvis January 07, 2014  FINDINGS: Uterus  Measurements: 7.7 x 4.2 x 4.9 cm. No fibroids or other mass visualized. Uterus is anteverted.  Endometrium  Thickness: 5 mm.  No focal abnormality visualized.  Right ovary  Measurements: 3.3 x 2.2 x 1.7 cm. Normal appearance/no adnexal mass.  Left ovary  Measurements: 3.0 x 2.4 x 2.4 cm. Normal appearance/no adnexal mass. There is a hypoechoic structure in the left ovary measuring 1.4 x 1.2 x 1.6 cm, probably a hemorrhagic follicle.  Other findings  No free fluid.  IMPRESSION: Small presumed hemorrhagic follicle left ovary. Short-interval follow up ultrasound in 6-12 weeks is recommended, preferably during the week following the patient's normal menses. Study otherwise unremarkable.   Electronically Signed   By:  Bretta Bang M.D.   On: 05/15/2014 13:31   US Pelvis Complete  05/15/2014   CLINICAL DATA:  Pelvic pain and menorrhagia  EXAM: TRANSABDOMINAL AND TRANSVAGINAL ULTRASOUND OF PELVIS  TECHNIQUE: Study was performed transabdominally to optimize pelvic field of view evaluation and transvaginally to optimize internal visceral architecture evaluation.  COMPARISON:  CT abdomen and pelvis January 07, 2014  FINDINGS: Uterus  Measurements: 7.7 x 4.2 x 4.9 cm. No fibroids or other mass visualized. Uterus is anteverted.  Endometrium  Thickness: 5 mm.  No focal abnormality visualized.  Right ovary  Measurements: 3.3 x 2.2 x 1.7 cm. Normal appearance/no adnexal mass.  Left ovary  Measurements: 3.0 x 2.4 x 2.4 cm. Normal appearance/no adnexal mass. There is a hypoechoic structure in the left ovary measuring 1.4 x 1.2 x 1.6 cm, probably a hemorrhagic follicle.  Other findings  No free fluid.  IMPRESSION: Small presumed hemorrhagic follicle left ovary. Short-interval follow up ultrasound in 6-12 weeks is recommended, preferably during the week following the patient's normal menses. Study otherwise unremarkable.   Electronically Signed   By: Bretta Bang M.D.   On: 05/15/2014 13:31    MDM  Plan prescription ibuprofen. Referral women's clinic. Cervical cultures pending. Final diagnoses:  None   Dx #1 dysfunctional uterine bleeding #2 pelvic pain     Doug Sou, MD 05/15/14 1445

## 2014-05-15 NOTE — ED Notes (Signed)
Patient presents today via EMS with a chief complaint of bilateral groin pain with radiation upward to epigastric region. Also reports vaginal bleeding x 2 weeks with increased heaviness and cramping over past 24 hours. Patient reports history of tubal.

## 2014-05-16 LAB — GC/CHLAMYDIA PROBE AMP
CT Probe RNA: NEGATIVE
GC Probe RNA: NEGATIVE

## 2014-09-02 ENCOUNTER — Emergency Department (HOSPITAL_COMMUNITY): Payer: Self-pay

## 2014-09-02 ENCOUNTER — Emergency Department (HOSPITAL_COMMUNITY)
Admission: EM | Admit: 2014-09-02 | Discharge: 2014-09-02 | Disposition: A | Discharge status: Home / Self Care | Payer: Self-pay | Attending: Emergency Medicine | Admitting: Emergency Medicine

## 2014-09-02 ENCOUNTER — Encounter (HOSPITAL_COMMUNITY): Payer: Self-pay | Admitting: *Deleted

## 2014-09-02 DIAGNOSIS — R200 Anesthesia of skin: Secondary | ICD-10-CM | POA: Insufficient documentation

## 2014-09-02 DIAGNOSIS — Z87891 Personal history of nicotine dependence: Secondary | ICD-10-CM | POA: Insufficient documentation

## 2014-09-02 DIAGNOSIS — G40909 Epilepsy, unspecified, not intractable, without status epilepticus: Secondary | ICD-10-CM | POA: Insufficient documentation

## 2014-09-02 DIAGNOSIS — F329 Major depressive disorder, single episode, unspecified: Secondary | ICD-10-CM | POA: Insufficient documentation

## 2014-09-02 DIAGNOSIS — G5601 Carpal tunnel syndrome, right upper limb: Secondary | ICD-10-CM

## 2014-09-02 DIAGNOSIS — Z79899 Other long term (current) drug therapy: Secondary | ICD-10-CM | POA: Insufficient documentation

## 2014-09-02 DIAGNOSIS — F419 Anxiety disorder, unspecified: Secondary | ICD-10-CM | POA: Insufficient documentation

## 2014-09-02 DIAGNOSIS — Z8709 Personal history of other diseases of the respiratory system: Secondary | ICD-10-CM | POA: Insufficient documentation

## 2014-09-02 MED ORDER — NAPROXEN 375 MG PO TABS: 375.0000 mg | ORAL_TABLET | Freq: Two times a day (BID) | ORAL | Status: DC

## 2014-09-02 NOTE — ED Provider Notes (Signed)
CSN: [id number]     Arrival date & time 09/02/14  1259 History  This chart was scribed for Raymon MuttonMarissa Esias Mory, PA-C with Azalia BilisKevin Campos, MD by Tonye RoyaltyJoshua Chen, ED Scribe. This patient was seen in room WTR8/WTR8 and the patient's care was started at 2:08 PM.    Chief Complaint  Patient presents with  . Hand Pain   The history is provided by the patient. No language interpreter was used.    HPI Comments: Patricia Drake is a 29 y.o. female with history of seizures, PTSD, depression, anxiety, chronic diarrhea, and collapsed lung who presents to the Emergency Department complaining of right hand pain with onset 1 month ago. She states it began in first 3 fingers, then began hurting more at night, and now is hurting during the day as well. She describes it as if needles are poking her. She states she is unable to write because it causes her hand to cramp. She notes it wakes her at night with both numbness and pain. She reports associated swelling. She states she used Ibuprofen and Advil with improvement at first, but states it is no longer improving her pain. She is right hand dominant. She worked at [employer] until recently. She denies recent falls or injures and denies prior injuries to that hand. She denies fever, chills, skin color changes, or elbow pain. LMP 09/02/2014  [facility]  Past Medical History  Diagnosis Date  . Seizures   . PTSD (post-traumatic stress disorder)   . Depression   . Anxiety   . Chronic diarrhea   . Collapsed lung    Past Surgical History  Procedure Laterality Date  . Cesarean section    . Tubal ligation     No family history on file. History  Substance Use Topics  . Smoking status: Former Smoker    Quit date: 11/11/2012  . Smokeless tobacco: Never Used  . Alcohol Use: No   OB History    No data available     Review of Systems  Constitutional: Negative for fever and chills.  Musculoskeletal:       Right hand pain and swelling  Skin:  Negative for color change.  Neurological: Positive for numbness.  Psychiatric/Behavioral: Negative for sleep disturbance.      Allergies  Sulfa antibiotics  Home Medications   Prior to Admission medications   Medication Sig Start Date End Date Taking? Authorizing Provider  albuterol (PROVENTIL HFA;VENTOLIN HFA) 108 (90 BASE) MCG/ACT inhaler Inhale 1-2 puffs into the lungs every 6 (six) hours as needed for wheezing or shortness of breath.   Yes Historical Provider, MD  phenytoin (DILANTIN) 100 MG ER capsule Take 400 mg by mouth at bedtime.    Yes Historical Provider, MD  amoxicillin (AMOXIL) 500 MG capsule Take 1 capsule (500 mg total) by mouth 3 (three) times daily. Patient not taking: Reported on 05/15/2014 04/12/14   Elson AreasLeslie K Sofia, PA-C  HYDROcodone-acetaminophen (NORCO/VICODIN) 5-325 MG per tablet Take 2 tablets by mouth every 4 (four) hours as needed. Patient not taking: Reported on 05/15/2014 04/12/14   Elson AreasLeslie K Sofia, PA-C  ibuprofen (ADVIL,MOTRIN) 800 MG tablet Take 1 tablet (800 mg total) by mouth 3 (three) times daily. Patient not taking: Reported on 09/02/2014 05/15/14   Doug SouSam Jacubowitz, MD  naproxen (NAPROSYN) 375 MG tablet Take 1 tablet (375 mg total) by mouth 2 (two) times daily. 09/02/14   Juniel Groene, PA-C  traZODone (DESYREL) 50 MG tablet Take 50 mg by mouth at bedtime.  Historical Provider, MD   BP 126/81 mmHg  Pulse 72  Temp(Src) 97.8 F (36.6 C) (Oral)  Resp 18  SpO2 100%  LMP 09/02/2014 Physical Exam  Constitutional: She is oriented to person, place, and time. She appears well-developed and well-nourished. No distress.  HENT:  Head: Normocephalic and atraumatic.  Eyes: Conjunctivae and EOM are normal. Right eye exhibits no discharge. Left eye exhibits no discharge.  Neck: Normal range of motion. Neck supple.  Cardiovascular: Normal rate, regular rhythm and normal heart sounds.   Pulses:      Radial pulses are 2+ on the right side, and 2+ on the left side.   Cap refill < 3 seconds  Pulmonary/Chest: Effort normal and breath sounds normal. No respiratory distress. She has no wheezes. She has no rales.  Musculoskeletal: She exhibits tenderness.  Mild swelling identified to the dorsal aspect of the right hand with negative erythema, warmth upon palpation, lesions, sores, deformities, malalignment. Tenderness upon palpation to the right thumb, index finger, long finger of the right hand. Full range of motion to the right wrist-full supination, pronation, flexion, extension. Negative snuffbox tenderness. Patient is able to produce a fist, but discomfort noted.  Positive Phalen's sign and Tinel's sign  Neurological: She is alert and oriented to person, place, and time. No cranial nerve deficit. She exhibits normal muscle tone. Coordination normal.  Strength 5+/5+ to upper extremities bilaterally with resistance applied, equal distribution noted Strength intact to MCP, PIP, DIP joints of right hand Equal grip strength  Sensation intact with differentiation sharp and dull touch  Skin: Skin is warm and dry. No rash noted. She is not diaphoretic. No erythema.  Psychiatric: She has a normal mood and affect. Her behavior is normal. Thought content normal.  Nursing note and vitals reviewed.   ED Course  Procedures (including critical care time)  DIAGNOSTIC STUDIES: Oxygen Saturation is 100% on room air, normal by my interpretation.    COORDINATION OF CARE: 2:16 PM Discussed treatment plan with patient at beside, including brace and follow up with hand specialist. The patient agrees with the plan and has no further questions at this time.   Labs Review Labs Reviewed - No data to display  Imaging Review Dg Wrist Complete Right  09/02/2014   CLINICAL DATA:  Right wrist pain for 1 month, no known injury, initial encounter  EXAM: RIGHT WRIST - COMPLETE 3+ VIEW  COMPARISON:  None.  FINDINGS: There is no evidence of fracture or dislocation. There is no  evidence of arthropathy or other focal bone abnormality. Soft tissues are unremarkable.  IMPRESSION: No acute abnormality noted.   Electronically Signed   By: Alcide Clever M.D.   On: 09/02/2014 14:33   Dg Hand Complete Right  09/02/2014   CLINICAL DATA:  Right hand pain for 1 month, no known injury, initial encounter  EXAM: RIGHT HAND - COMPLETE 3+ VIEW  COMPARISON:  None.  FINDINGS: There is no evidence of fracture or dislocation. There is no evidence of arthropathy or other focal bone abnormality. Soft tissues are unremarkable.  IMPRESSION: No acute abnormality noted.   Electronically Signed   By: Alcide Clever M.D.   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 The recorded information has been reviewed and is accurate.  Plain film of right hand and wrist negative for acute osseous abnormality. Negative findings of compartment syndrome. Negative findings a septic joint. Doubt tenosynovitis. High suspicion of carpal tunnel syndrome. Cap refill less than 3 seconds. Strength intact. Sensation intact. Pulses palpable and strong. Negative signs of ishcemia. Patient placed in thumb spica for comfort purposes. Patient stable, afebrile. Patient not septic appearing. Discharged patient. [facility] and [facility]. Discussed with patient to rest and stay hydrated. Discussed with patient to closely monitor symptoms and if symptoms are to worsen or change to report back to the ED - strict return instructions given.  Patient agreed to plan of care, understood, all questions answered.   Raymon Mutton, PA-C 09/02/14 1455  Azalia Bilis, MD 09/02/14 [id number]

## 2014-09-02 NOTE — Discharge Instructions (Signed)
Please call your doctor for a followup appointment within 24-48 hours. When you talk to your doctor please let them know that you were seen in the emergency department and have them acquire all of your records so that they can discuss the findings with you and formulate a treatment plan to fully care for your new and ongoing problems. Please follow-up with orthopedics Please follow-up with primary care provider Please rest and stay hydrated Please keep arm in brace at all times for comfort purposes Please massage with icy hot ointment Please continue to monitor symptoms closely and if symptoms are to worsen or change (fever greater than 101, chills, sweating, nausea, vomiting, chest pain, shortness of breathe, difficulty breathing, weakness, numbness, tingling, worsening or changes to pain pattern, swelling, red streaks, loss sensation, fall, injury, changes to fingertips such as blue/white/red/black) please report back to the Emergency Department immediately.    Carpal Tunnel Syndrome The carpal tunnel is a narrow area located on the palm side of your wrist. The tunnel is formed by the wrist bones and ligaments. Nerves, blood vessels, and tendons pass through the carpal tunnel. Repeated wrist motion or certain diseases may cause swelling within the tunnel. This swelling pinches the main nerve in the wrist (median nerve) and causes the painful hand and arm condition called carpal tunnel syndrome. CAUSES   Repeated wrist motions.  Wrist injuries.  Certain diseases like arthritis, diabetes, alcoholism, hyperthyroidism, and kidney failure.  Obesity.  Pregnancy. SYMPTOMS   A "pins and needles" feeling in your fingers or hand, especially in your thumb, index and middle fingers.  Tingling or numbness in your fingers or hand.  An aching feeling in your entire arm, especially when your wrist and elbow are bent for long periods of time.  Wrist pain that goes up your arm to your shoulder.  Pain  that goes down into your palm or fingers.  A weak feeling in your hands. DIAGNOSIS  Your health care provider will take your history and perform a physical exam. An electromyography test may be needed. This test measures electrical signals sent out by your nerves into the muscles. The electrical signals are usually slowed by carpal tunnel syndrome. You may also need X-rays. TREATMENT  Carpal tunnel syndrome may clear up by itself. Your health care provider may recommend a wrist splint or medicine such as a nonsteroidal anti-inflammatory medicine. Cortisone injections may help. Sometimes, surgery may be needed to free the pinched nerve.  HOME CARE INSTRUCTIONS   Take all medicine as directed by your health care provider. Only take over-the-counter or prescription medicines for pain, discomfort, or fever as directed by your health care provider.  If you were given a splint to keep your wrist from bending, wear it as directed. It is important to wear the splint at night. Wear the splint for as long as you have pain or numbness in your hand, arm, or wrist. This may take 1 to 2 months.  Rest your wrist from any activity that may be causing your pain. If your symptoms are work-related, you may need to talk to your employer about changing to a job that does not require using your wrist.  Put ice on your wrist after long periods of wrist activity.  Put ice in a plastic bag.  Place a towel between your skin and the bag.  Leave the ice on for 15-20 minutes, 03-04 times a day.  Keep all follow-up visits as directed by your health care provider. This includes any  orthopedic referrals, physical therapy, and rehabilitation. Any delay in getting necessary care could result in a delay or failure of your condition to heal. SEEK IMMEDIATE MEDICAL CARE IF:   You have new, unexplained symptoms.  Your symptoms get worse and are not helped or controlled with medicines. MAKE SURE YOU:   Understand these  instructions.  Will watch your condition.  Will get help right away if you are not doing well or get worse. Document Released: 05/29/2000 Document Revised: 10/16/2013 Document Reviewed: 04/17/2011 [facility] Patient Information 2015 [employer] [place]. This information is not intended to replace advice given to you by your health care provider. Make sure you discuss any questions you have with your health care provider.   Emergency Department Resource Guide 1) Find a Doctor and Pay Out of Pocket Although you won't have to find out who is covered by your insurance plan, it is a good idea to ask around and get recommendations. You will then need to call the office and see if the doctor you have chosen will accept you as a new patient and what types of options they offer for patients who are self-pay. Some doctors offer discounts or will set up payment plans for their patients who do not have insurance, but you will need to ask so you aren't surprised when you get to your appointment.  2) Contact Your Local Health Department Not all health departments have doctors that can see patients for sick visits, but many do, so it is worth a call to see if yours does. If you don't know where your local health department is, you can check in your phone book. The CDC also has a tool to help you locate your state's health department, and many state websites also have listings of all of their local health departments.  3) Find a Walk-in Clinic If your illness is not likely to be very severe or complicated, you may want to try a walk in clinic. These are popping up all over the country in pharmacies, drugstores, and shopping centers. They're usually staffed by nurse practitioners or physician assistants that have been trained to treat common illnesses and complaints. They're usually fairly quick and inexpensive. However, if you have serious medical issues or chronic medical problems, these are probably not your best  option.  No Primary Care Doctor: - Call Health Connect at  [phone] - they can help you locate a primary care doctor that  accepts your insurance, provides certain services, etc. - Physician Referral Service- [phone]  Chronic Pain Problems: Organization         Address  Phone   Notes  [facility]  [phone] Patients need to be referred by their primary care doctor.   Medication Assistance: Organization         Address  Phone   Notes  [facility] [address] [place], [place] [phone]) [phone] --Must be a resident of [facility] [place] -- Must have NO insurance coverage whatsoever (no Medicaid/ Medicare, etc.) -- The pt. MUST have a primary care doctor that directs their care regularly and follows them in the community   Med[facility]ist  [phone]   [employer]  [phone]    Agencies that provide inexpensive medical care: Organization         Address  Phone   Notes  [facility]  [phone]   [facility]    [phone]  [facility] [address] [place], [place] [address] [phone]   [facility] [address] [place], [place] [phone]   Planned Parenthood    [phone]   [facility]    [phone]   [facility] and [facility]  [address], [place] Phone:  [phone], Fax:  [phone] Hours of Operation:  9 am - 6 pm, M-F.  Also accepts Medicaid/Medicare and self-pay.  [facility]  [address], [place] Phone: [phone], Fax: [phone]. Hours of Operation:  8:30 am - 5:30 pm, M-F.  Also accepts Medicaid and self-pay.  [facility] [place] [address] [place] Phone: [phone]   [facility] [address] Natasha Bence [place], [place] [phone], Ext. [phone] Mondays & Thursdays: 7-9 AM.  First 15  patients are seen on a first come, first serve basis.    Medicaid-accepting [facility] Providers:  Organization         Address  Phone   Notes  [facility] [address], [place] [phone] Also accepts self-pay patients.  [facility] [address] Laurell Josephs [place], [place]  [phone]   [facility] [address], [place] [phone]   [facility] [address] [place] [phone]   Renaye Rakers [address], [place]   [phone] Only accepts [place] Access [place] patients after they have their name applied to their card.   Self-Pay (no insurance) in [facility]:  Organization         Address  Phone   Notes  Sickle Cell Patients, [facility] [address] [place], [place] [phone]   [facility] [address] [place], [place] [phone]   [facility]  [address], [place] [phone]   [facility]/Dr. Osei-Bonsu  [address] [place] or [address], [place] [phone] Phone number for both [place] and [place] locations is the same.  [facility] [address], [place] [phone]   [facility] [address], [place] or [address] [phone]   [facility] [address], [place] [phone], phone; [phone], fax Sees patients 1st and 3rd Saturday of every month.  Must not qualify for public or private insurance (i.e. Medicaid, Medicare, [place] Health Choice, Veterans' Benefits)  Household income should be no more than 200% of the poverty level The clinic cannot treat you if you are pregnant or think you are pregnant  Sexually transmitted diseases are not treated at the clinic.    Dental  Care: Organization         Address  Phone  Notes  [facility] Department of [facility] [address] [place], [place] [phone] Accepts children up to age 32 who are enrolled in [place] or [place] Health Choice; pregnant women with a Medicaid card; and children who have applied for Medicaid or [place] Health Choice, but were declined, whose parents can pay a reduced fee at time of service.  [facility]  [address], [place] [phone] Accepts children up to age 79 who  are enrolled in Medicaid or [place] Health Choice; pregnant women with a Medicaid card; and children who have applied for Medicaid or [place] Health Choice, but were declined, whose parents can pay a reduced fee at time of service.  [facility]  [place] [phone] Patients are seen by appointment only. Walk-ins are not accepted. [place] will see patients 53 years of age and older. Monday - Tuesday (8am-5pm) Most Wednesdays (8:30-5pm) $30 per visit, cash only  [facility] Adult Dental Access PROGRAM  [address], [facility] [phone] Patients are seen by appointment only. Walk-ins are not accepted. [place] will see patients 37 years of age and older. One Wednesday Evening (Monthly: Volunteer Based).  $30 per visit, cash only  [place]  [phone] for adults; Children under age 50, call Graduate Pediatric Dentistry at [phone]. Children aged 25-14, please call [phone] to request a pediatric application.  Dental services are provided in all areas of dental care including fillings, crowns and bridges, complete and partial dentures, implants, gum treatment, root canals, and extractions. Preventive care is also provided. Treatment is provided to both adults and children. Patients are selected via a lottery and there is often a waiting list.   [facility] [address], [place]  [phone] [email or web address]   [facility] [address] [place], [place] [phone], Ext. [phone] Second and Fourth Thursday of each month, opens at 6:30 AM; Clinic ends at 9 AM.  Patients are seen on a first-come first-served basis, and a limited number are seen during each clinic.   [facility]  [address] Hillard Danker [place], [place] [phone]   Eligibility Requirements You must have lived in [place], [place], or [place] counties for at least the last three months.   You cannot be eligible for state or federal sponsored [employer], including [employer], [place], or [employer].   You generally cannot be eligible for healthcare insurance through your employer.    How to apply: Eligibility screenings are held every Tuesday and Wednesday afternoon from 1:00 pm until 4:00 pm. You do not need an appointment for the interview!  [facility] [address] [place], [place]   [place]  [place] Department  [place]  [phone]    Behavioral Health Resources in the Community: Intensive Outpatient Programs Organization         Address  Phone  Notes  [facility] [place]. [address], [place], [place] [phone]   [facility] Outpatient [address], [place], [place]   ADS: Alcohol & Drug Svcs [address], [place], [place]   [place] [address]  [place], [place] or [phone]   Substance Abuse Resources Organization         Address  Phone  Notes  Alcohol and Drug Services  [phone]   [place]  [phone]   The [place]   Chinita Pester  [phone]   Residential & Outpatient Substance Abuse Program  [phone]    Psychological Services Organization         Address  Phone  Notes  [facility] [place] [facility]  [place]  [phone]   [place] [address],  [place] [phone] or [phone]    Mobile Crisis Teams Organization         Address  Phone  Notes  [facility], Mobile Crisis Care Unit  [phone]   [facility]  [address] [place], [place] [phone]   Doristine Locks [address] [place] [phone]    Self-Help/Support Groups Organization         Address  Phone             Notes  [facility] [place] - variety of support groups  [phone]- I7437963 Call for more information  Narcotics Anonymous (NA), [facility] [address], [employer] [place]  2 meetings at this location   Statistician         Address  Phone  Notes  [facility] [address],    [place]  [phone]   [facility]  [address], [place] [address], [place], [place] [phone]   [facility] [address] [place], [place] [phone] Admissions: 8am-3pm M-F  [facility] [address]    [place], [place] [phone]   [facility] [address] [place], [place], [place] [phone]   [facility] [address],  [place], [place] [phone]   [facility] - Intensive Outpatient [address] Laurell Josephs [address], [place], [place] [phone]   [facility] ([facility]iction Recovery [facility] [address] [place].,  [place], [place] [phone] or [phone]   [facility] ([facility]) [address] [place], [place] [phone] Accepts Medicaid  [facility] [place] [address]  [place] [phone] Substance Abuse/Addiction Treatment   [facility] Organization         Address  Phone  Notes  [facility]  [phone]   Angie Fava, PhD [address] Ervin Knack [place], [place]   [phone] or [phone]   [facility]   [address] [place], [place] [phone]   [facility] [address] [place], [place] [phone] Insurance/Medicaid/sponsorship through [facility] and Families [address]                                    [place], [place] [phone] Therapy/tele-psych/case  [facility] [address], [place] [phone]    Dr. Lolly Mustache  [phone]   [facility] [place]  [employer] [facility] Dept. 1) [address], [place] 2) [address], [place] 3)  [address], [place] [phone]  [phone]   [facility] Child Abuse Hotline [phone] or [phone] (After Hours)

## 2014-09-02 NOTE — ED Notes (Signed)
Pt reports pain in right hand x 1 month

## 2014-10-02 ENCOUNTER — Emergency Department (HOSPITAL_COMMUNITY)
Admission: EM | Admit: 2014-10-02 | Discharge: 2014-10-02 | Disposition: A | Discharge status: Home / Self Care | Payer: Self-pay | Attending: Emergency Medicine | Admitting: Emergency Medicine

## 2014-10-02 ENCOUNTER — Encounter (HOSPITAL_COMMUNITY): Payer: Self-pay | Admitting: Emergency Medicine

## 2014-10-02 DIAGNOSIS — Z87891 Personal history of nicotine dependence: Secondary | ICD-10-CM | POA: Insufficient documentation

## 2014-10-02 DIAGNOSIS — G40909 Epilepsy, unspecified, not intractable, without status epilepticus: Secondary | ICD-10-CM | POA: Insufficient documentation

## 2014-10-02 DIAGNOSIS — Z8719 Personal history of other diseases of the digestive system: Secondary | ICD-10-CM | POA: Insufficient documentation

## 2014-10-02 DIAGNOSIS — M546 Pain in thoracic spine: Secondary | ICD-10-CM | POA: Insufficient documentation

## 2014-10-02 DIAGNOSIS — Z8709 Personal history of other diseases of the respiratory system: Secondary | ICD-10-CM | POA: Insufficient documentation

## 2014-10-02 DIAGNOSIS — Z9851 Tubal ligation status: Secondary | ICD-10-CM | POA: Insufficient documentation

## 2014-10-02 DIAGNOSIS — Z792 Long term (current) use of antibiotics: Secondary | ICD-10-CM | POA: Insufficient documentation

## 2014-10-02 DIAGNOSIS — F419 Anxiety disorder, unspecified: Secondary | ICD-10-CM | POA: Insufficient documentation

## 2014-10-02 DIAGNOSIS — Z79899 Other long term (current) drug therapy: Secondary | ICD-10-CM | POA: Insufficient documentation

## 2014-10-02 MED ORDER — OXYCODONE-ACETAMINOPHEN 5-325 MG PO TABS: 1.0000 | ORAL_TABLET | Freq: Four times a day (QID) | ORAL | Status: DC | PRN

## 2014-10-02 MED ORDER — IBUPROFEN 800 MG PO TABS
800.0000 mg | ORAL_TABLET | Freq: Once | ORAL | Status: AC
  Administered 2014-10-02: 800 mg via ORAL
  Filled 2014-10-02: qty 1

## 2014-10-02 MED ORDER — OXYCODONE-ACETAMINOPHEN 5-325 MG PO TABS
1.0000 | ORAL_TABLET | Freq: Once | ORAL | Status: AC
  Administered 2014-10-02: 1 via ORAL
  Filled 2014-10-02: qty 1

## 2014-10-02 MED ORDER — IBUPROFEN 800 MG PO TABS: 800.0000 mg | ORAL_TABLET | Freq: Three times a day (TID) | ORAL | Status: DC

## 2014-10-02 NOTE — ED Notes (Signed)
Pt reports having upper back pain x2 days.

## 2014-10-02 NOTE — ED Provider Notes (Signed)
CSN: [id number]     Arrival date & time 10/02/14  0119 History   First MD Initiated Contact with Patient 10/02/14 0228     Chief Complaint  Patient presents with  . Back Pain     (Consider location/radiation/quality/duration/timing/severity/associated sxs/prior Treatment) Patient is a 29 y.o. female presenting with back pain. The history is provided by the patient. No language interpreter was used.  Back Pain Location:  Thoracic spine Quality:  Aching and shooting Pain severity:  Severe Duration:  3 days Associated symptoms: no chest pain and no fever   Associated symptoms comment:  Thoracic back pain for 3 days without known injury. It is worse with movement, better with rest. It does not cause SOB but is worse with breathing. No chest pain. No fever, cough, N, V.    Past Medical History  Diagnosis Date  . Seizures   . PTSD (post-traumatic stress disorder)   . Depression   . Anxiety   . Chronic diarrhea   . Collapsed lung    Past Surgical History  Procedure Laterality Date  . Cesarean section    . Tubal ligation     History reviewed. No pertinent family history. History  Substance Use Topics  . Smoking status: Former Smoker    Quit date: 11/11/2012  . Smokeless tobacco: Never Used  . Alcohol Use: No   OB History    No data available     Review of Systems  Constitutional: Negative for fever and chills.  Respiratory: Negative.  Negative for cough and shortness of breath.   Cardiovascular: Negative.  Negative for chest pain.  Gastrointestinal: Negative.  Negative for nausea and vomiting.  Musculoskeletal: Positive for back pain. Negative for neck pain.  Skin: Negative.   Neurological: Negative.       Allergies  Sulfa antibiotics  Home Medications   Prior to Admission medications   Medication Sig Start Date End Date Taking? Authorizing Provider  albuterol (PROVENTIL HFA;VENTOLIN HFA) 108 (90 BASE) MCG/ACT inhaler Inhale 1-2 puffs into the lungs every 6  (six) hours as needed for wheezing or shortness of breath.   Yes Historical Provider, MD  ibuprofen (ADVIL,MOTRIN) 200 MG tablet Take 600 mg by mouth every 6 (six) hours as needed (for pain.).   Yes Historical Provider, MD  phenytoin (DILANTIN) 100 MG ER capsule Take 400 mg by mouth at bedtime.    Yes Historical Provider, MD  amoxicillin (AMOXIL) 500 MG capsule Take 1 capsule (500 mg total) by mouth 3 (three) times daily. Patient not taking: Reported on 05/15/2014 04/12/14   Elson Areas, PA-C  HYDROcodone-acetaminophen (NORCO/VICODIN) 5-325 MG per tablet Take 2 tablets by mouth every 4 (four) hours as needed. Patient not taking: Reported on 05/15/2014 04/12/14   Elson Areas, PA-C  ibuprofen (ADVIL,MOTRIN) 800 MG tablet Take 1 tablet (800 mg total) by mouth 3 (three) times daily. Patient not taking: Reported on 09/02/2014 05/15/14   Doug Sou, MD  naproxen (NAPROSYN) 375 MG tablet Take 1 tablet (375 mg total) by mouth 2 (two) times daily. Patient not taking: Reported on 10/02/2014 09/02/14   Marissa Sciacca, PA-C   BP 123/69 mmHg  Pulse 90  Temp(Src) 97.8 F (36.6 C) (Oral)  Ht  (1.575 m)  Wt 220 lb (99.791 kg)  BMI 40.23 kg/m2  SpO2 98%  LMP 09/02/2014 Physical Exam  Constitutional: She appears well-developed and well-nourished.  HENT:  Head: Normocephalic.  Neck: Normal range of motion. Neck supple.  Cardiovascular: Normal rate.  Pulmonary/Chest: Effort normal and breath sounds normal. She has no wheezes. She has no rales.  Abdominal: Soft. Bowel sounds are normal. There is no tenderness. There is no rebound and no guarding.  Musculoskeletal: Normal range of motion.  Bilateral mid-thoracic muscular tenderness without palpable spasm.   Neurological: She is alert. No cranial nerve deficit.  Skin: Skin is warm and dry. No rash noted.  Psychiatric: She has a normal mood and affect.    ED Course  Procedures (including critical care time) Labs Review Labs Reviewed - No  data to display  Imaging Review No results found.   EKG Interpretation None      MDM   Final diagnoses:  None    1. Back pain  Pain follows muscular pattern. VSS, normal. Do not suspect pulmonary condition. Stable for discharge.     Elpidio AnisShari Zakiah Gauthreaux, PA-C 10/02/14 [id number]  Gilda Creasehristopher J Pollina, MD 10/02/14 [id number]

## 2014-10-02 NOTE — Discharge Instructions (Signed)
Back Pain, Adult °Low back pain is very common. About 1 in 5 people have back pain. The cause of low back pain is rarely dangerous. The pain often gets better over time. About half of people with a sudden onset of back pain feel better in just 2 weeks. About 8 in 10 people feel better by 6 weeks.  °CAUSES °Some common causes of back pain include: °· Strain of the muscles or ligaments supporting the spine. °· Wear and tear (degeneration) of the spinal discs. °· Arthritis. °· Direct injury to the back. °DIAGNOSIS °Most of the time, the direct cause of low back pain is not known. However, back pain can be treated effectively even when the exact cause of the pain is unknown. Answering your caregiver's questions about your overall health and symptoms is one of the most accurate ways to make sure the cause of your pain is not dangerous. If your caregiver needs more information, he or she may order lab work or imaging tests (X-rays or MRIs). However, even if imaging tests show changes in your back, this usually does not require surgery. °HOME CARE INSTRUCTIONS °For many people, back pain returns. Since low back pain is rarely dangerous, it is often a condition that people can learn to manage on their own.  °· Remain active. It is stressful on the back to sit or stand in one place. Do not sit, drive, or stand in one place for more than 30 minutes at a time. Take short walks on level surfaces as soon as pain allows. Try to increase the length of time you walk each day. °· Do not stay in bed. Resting more than 1 or 2 days can delay your recovery. °· Do not avoid exercise or work. Your body is made to move. It is not dangerous to be active, even though your back may hurt. Your back will likely heal faster if you return to being active before your pain is gone. °· Pay attention to your body when you  bend and lift. Many people have less discomfort when lifting if they bend their knees, keep the load close to their bodies, and  avoid twisting. Often, the most comfortable positions are those that put less stress on your recovering back. °· Find a comfortable position to sleep. Use a firm mattress and lie on your side with your knees slightly bent. If you lie on your back, put a pillow under your knees. °· Only take over-the-counter or prescription medicines as directed by your caregiver. Over-the-counter medicines to reduce pain and inflammation are often the most helpful. Your caregiver may prescribe muscle relaxant drugs. These medicines help dull your pain so you can more quickly return to your normal activities and healthy exercise. °· Put ice on the injured area. °· Put ice in a plastic bag. °· Place a towel between your skin and the bag. °· Leave the ice on for 15-20 minutes, 03-04 times a day for the first 2 to 3 days. After that, ice and heat may be alternated to reduce pain and spasms. °· Ask your caregiver about trying back exercises and gentle massage. This may be of some benefit. °· Avoid feeling anxious or stressed. Stress increases muscle tension and can worsen back pain. It is important to recognize when you are anxious or stressed and learn ways to manage it. Exercise is a great option. °SEEK MEDICAL CARE IF: °· You have pain that is not relieved with rest or medicine. °· You have pain that does not improve in 1 week. °· You have new symptoms. °· You are generally not feeling well. °SEEK   IMMEDIATE MEDICAL CARE IF:  °· You have pain that radiates from your back into your legs. °· You develop new bowel or bladder control problems. °· You have unusual weakness or numbness in your arms or legs. °· You develop nausea or vomiting. °· You develop abdominal pain. °· You feel faint. °Document Released: 06/01/2005 Document Revised: 12/01/2011 Document Reviewed: 10/03/2013 °ExitCare® Patient Information ©2015 [employer]. This information is not intended to replace advice given to you by your health care provider. Make sure you  discuss any questions you have with your health care provider. ° ° °Heat Therapy °Heat therapy can help ease sore, stiff, injured, and tight muscles and joints. Heat relaxes your muscles, which may help ease your pain.  °RISKS AND COMPLICATIONS °If you have any of the following conditions, do not use heat therapy unless your health care provider has approved: °· Poor circulation. °· Healing wounds or scarred skin in the area being treated. °· Diabetes, heart disease, or high blood pressure. °· Not being able to feel (numbness) the area being treated. °· Unusual swelling of the area being treated. °· Active infections. °· Blood clots. °· Cancer. °· Inability to communicate pain. This may include young children and people who have problems with their brain function (dementia). °· Pregnancy. °Heat therapy should only be used on old, pre-existing, or long-lasting (chronic) injuries. Do not use heat therapy on new injuries unless directed by your health care provider. °HOW TO USE HEAT THERAPY °There are several different kinds of heat therapy, including: °· Moist heat pack. °· Warm water bath. °· Hot water bottle. °· Electric heating pad. °· Heated gel pack. °· Heated wrap. °· Electric heating pad. °Use the heat therapy method suggested by your health care provider. Follow your health care provider's instructions on when and how to use heat therapy. °GENERAL HEAT THERAPY RECOMMENDATIONS °· Do not sleep while using heat therapy. Only use heat therapy while you are awake. °· Your skin may turn pink while using heat therapy. Do not use heat therapy if your skin turns red. °· Do not use heat therapy if you have new pain. °· High heat or long exposure to heat can cause burns. Be careful when using heat therapy to avoid burning your skin. °· Do not use heat therapy on areas of your skin that are already irritated, such as with a rash or sunburn. °SEEK MEDICAL CARE IF: °· You have blisters, redness, swelling, or  numbness. °· You have new pain. °· Your pain is worse. °MAKE SURE YOU: °· Understand these instructions. °· Will watch your condition. °· Will get help right away if you are not doing well or get worse. °Document Released: 08/24/2011 Document Revised: 10/16/2013 Document Reviewed: 07/25/2013 °ExitCare® Patient Information ©2015 [employer]. This information is not intended to replace advice given to you by your health care provider. Make sure you discuss any questions you have with your health care provider. ° °

## 2014-11-13 ENCOUNTER — Emergency Department (HOSPITAL_COMMUNITY)
Admission: EM | Admit: 2014-11-13 | Discharge: 2014-11-13 | Disposition: A | Discharge status: Home / Self Care | Payer: Self-pay | Attending: Emergency Medicine | Admitting: Emergency Medicine

## 2014-11-13 ENCOUNTER — Encounter (HOSPITAL_COMMUNITY): Payer: Self-pay | Admitting: Emergency Medicine

## 2014-11-13 DIAGNOSIS — Z3202 Encounter for pregnancy test, result negative: Secondary | ICD-10-CM | POA: Insufficient documentation

## 2014-11-13 DIAGNOSIS — R112 Nausea with vomiting, unspecified: Secondary | ICD-10-CM | POA: Insufficient documentation

## 2014-11-13 DIAGNOSIS — F419 Anxiety disorder, unspecified: Secondary | ICD-10-CM | POA: Insufficient documentation

## 2014-11-13 DIAGNOSIS — E669 Obesity, unspecified: Secondary | ICD-10-CM | POA: Insufficient documentation

## 2014-11-13 DIAGNOSIS — Z792 Long term (current) use of antibiotics: Secondary | ICD-10-CM | POA: Insufficient documentation

## 2014-11-13 DIAGNOSIS — R197 Diarrhea, unspecified: Secondary | ICD-10-CM | POA: Insufficient documentation

## 2014-11-13 DIAGNOSIS — R1084 Generalized abdominal pain: Secondary | ICD-10-CM | POA: Insufficient documentation

## 2014-11-13 DIAGNOSIS — Z87891 Personal history of nicotine dependence: Secondary | ICD-10-CM | POA: Insufficient documentation

## 2014-11-13 DIAGNOSIS — Z79899 Other long term (current) drug therapy: Secondary | ICD-10-CM | POA: Insufficient documentation

## 2014-11-13 DIAGNOSIS — Z9851 Tubal ligation status: Secondary | ICD-10-CM | POA: Insufficient documentation

## 2014-11-13 DIAGNOSIS — Z791 Long term (current) use of non-steroidal anti-inflammatories (NSAID): Secondary | ICD-10-CM | POA: Insufficient documentation

## 2014-11-13 DIAGNOSIS — G40909 Epilepsy, unspecified, not intractable, without status epilepticus: Secondary | ICD-10-CM | POA: Insufficient documentation

## 2014-11-13 DIAGNOSIS — R1912 Hyperactive bowel sounds: Secondary | ICD-10-CM | POA: Insufficient documentation

## 2014-11-13 LAB — COMPREHENSIVE METABOLIC PANEL
ALT: 18 U/L (ref 14–54)
AST: 17 U/L (ref 15–41)
Albumin: 3.6 g/dL (ref 3.5–5.0)
Alkaline Phosphatase: 64 U/L (ref 38–126)
Anion gap: 8 (ref 5–15)
BILIRUBIN TOTAL: 0.3 mg/dL (ref 0.3–1.2)
BUN: 7 mg/dL (ref 6–20)
CO2: 21 mmol/L — ABNORMAL LOW (ref 22–32)
Calcium: 8.6 mg/dL — ABNORMAL LOW (ref 8.9–10.3)
Chloride: 107 mmol/L (ref 101–111)
Creatinine, Ser: 0.54 mg/dL (ref 0.44–1.00)
Glucose, Bld: 94 mg/dL (ref 65–99)
Potassium: 3.8 mmol/L (ref 3.5–5.1)
SODIUM: 136 mmol/L (ref 135–145)
TOTAL PROTEIN: 7.1 g/dL (ref 6.5–8.1)

## 2014-11-13 LAB — URINALYSIS, ROUTINE W REFLEX MICROSCOPIC
BILIRUBIN URINE: NEGATIVE
Glucose, UA: NEGATIVE mg/dL
HGB URINE DIPSTICK: NEGATIVE
Ketones, ur: NEGATIVE mg/dL
Nitrite: NEGATIVE
PH: 6.5 (ref 5.0–8.0)
Protein, ur: NEGATIVE mg/dL
SPECIFIC GRAVITY, URINE: 1.019 (ref 1.005–1.030)
Urobilinogen, UA: 0.2 mg/dL (ref 0.0–1.0)

## 2014-11-13 LAB — LIPASE, BLOOD: Lipase: 15 U/L — ABNORMAL LOW (ref 22–51)

## 2014-11-13 LAB — CBC WITH DIFFERENTIAL/PLATELET
BASOS PCT: 1 % (ref 0–1)
Basophils Absolute: 0 10*3/uL (ref 0.0–0.1)
EOS ABS: 0.2 10*3/uL (ref 0.0–0.7)
Eosinophils Relative: 3 % (ref 0–5)
HCT: 40.2 % (ref 36.0–46.0)
HEMOGLOBIN: 13.3 g/dL (ref 12.0–15.0)
LYMPHS PCT: 25 % (ref 12–46)
Lymphs Abs: 1.8 10*3/uL (ref 0.7–4.0)
MCH: 30.4 pg (ref 26.0–34.0)
MCHC: 33.1 g/dL (ref 30.0–36.0)
MCV: 91.8 fL (ref 78.0–100.0)
MONO ABS: 0.5 10*3/uL (ref 0.1–1.0)
MONOS PCT: 7 % (ref 3–12)
NEUTROS ABS: 4.6 10*3/uL (ref 1.7–7.7)
NEUTROS PCT: 64 % (ref 43–77)
Platelets: 318 10*3/uL (ref 150–400)
RBC: 4.38 MIL/uL (ref 3.87–5.11)
RDW: 12.5 % (ref 11.5–15.5)
WBC: 7.2 10*3/uL (ref 4.0–10.5)

## 2014-11-13 LAB — URINE MICROSCOPIC-ADD ON

## 2014-11-13 LAB — WET PREP, GENITAL
Trich, Wet Prep: NONE SEEN
Yeast Wet Prep HPF POC: NONE SEEN

## 2014-11-13 LAB — POC URINE PREG, ED: PREG TEST UR: NEGATIVE

## 2014-11-13 MED ORDER — NAPROXEN 375 MG PO TABS: 375.0000 mg | ORAL_TABLET | Freq: Two times a day (BID) | ORAL | Status: DC

## 2014-11-13 MED ORDER — MORPHINE SULFATE 4 MG/ML IJ SOLN
6.0000 mg | Freq: Once | INTRAMUSCULAR | Status: AC
  Administered 2014-11-13: 6 mg via INTRAVENOUS
  Filled 2014-11-13: qty 2

## 2014-11-13 MED ORDER — ONDANSETRON HCL 4 MG/2ML IJ SOLN
4.0000 mg | Freq: Once | INTRAMUSCULAR | Status: AC
  Administered 2014-11-13: 4 mg via INTRAVENOUS
  Filled 2014-11-13: qty 2

## 2014-11-13 MED ORDER — AZITHROMYCIN 250 MG PO TABS
1000.0000 mg | ORAL_TABLET | Freq: Once | ORAL | Status: AC
  Administered 2014-11-13: 1000 mg via ORAL
  Filled 2014-11-13: qty 4

## 2014-11-13 MED ORDER — SODIUM CHLORIDE 0.9 % IV BOLUS (SEPSIS)
1000.0000 mL | Freq: Once | INTRAVENOUS | Status: AC
  Administered 2014-11-13: 1000 mL via INTRAVENOUS

## 2014-11-13 MED ORDER — ONDANSETRON HCL 4 MG PO TABS: 4.0000 mg | ORAL_TABLET | Freq: Four times a day (QID) | ORAL | Status: DC

## 2014-11-13 MED ORDER — CEFTRIAXONE SODIUM 250 MG IJ SOLR
250.0000 mg | Freq: Once | INTRAMUSCULAR | Status: AC
  Administered 2014-11-13: 250 mg via INTRAMUSCULAR
  Filled 2014-11-13: qty 250

## 2014-11-13 NOTE — ED Notes (Signed)
Per pt, states abdomin pain and diarrhea that started yesterday
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
## 2014-11-13 NOTE — ED Provider Notes (Signed)
CSN: [id number]     Arrival date & time 11/13/14  1057 History   First MD Initiated Contact with Patient 11/13/14 1113     Chief Complaint  Patient presents with  . Abdominal Pain     (Consider location/radiation/quality/duration/timing/severity/associated sxs/prior Treatment) HPI  29 year old female with history of PTSD, depression, anxiety, chronic diarrhea who presents for evaluation of abdominal pain. Patient reports for the past 2 days she has had crampy mid abdominal pain with associated nausea vomiting diarrhea. States she has vomited at least 5 times per day, vomitus is nonbloody nonbilious. She has had numerous bouts of nonbloody non-mucousy diarrhea. Her pain is rated as 8 out of 10. She endorse chills, sweats, and nonproductive cough. She tries taking Advil with minimal improvement. She denies any recent antibiotic use. She denies fever, headache, neck stiffness, back pain, dysuria, hematuria, hematochezia or melena.  No recent travel or any exotic food. No recent sick contact.  Past Medical History  Diagnosis Date  . Seizures   . PTSD (post-traumatic stress disorder)   . Depression   . Anxiety   . Chronic diarrhea   . Collapsed lung    Past Surgical History  Procedure Laterality Date  . Cesarean section    . Tubal ligation     No family history on file. History  Substance Use Topics  . Smoking status: Former Smoker    Quit date: 11/11/2012  . Smokeless tobacco: Never Used  . Alcohol Use: No   OB History    No data available     Review of Systems  All other systems reviewed and are negative.     Allergies  Sulfa antibiotics  Home Medications   Prior to Admission medications   Medication Sig Start Date End Date Taking? Authorizing Provider  albuterol (PROVENTIL HFA;VENTOLIN HFA) 108 (90 BASE) MCG/ACT inhaler Inhale 1-2 puffs into the lungs every 6 (six) hours as needed for wheezing or shortness of breath.    Historical Provider, MD  amoxicillin (AMOXIL)  500 MG capsule Take 1 capsule (500 mg total) by mouth 3 (three) times daily. Patient not taking: Reported on 05/15/2014 04/12/14   Elson AreasLeslie K Sofia, PA-C  HYDROcodone-acetaminophen (NORCO/VICODIN) 5-325 MG per tablet Take 2 tablets by mouth every 4 (four) hours as needed. Patient not taking: Reported on 05/15/2014 04/12/14   Elson AreasLeslie K Sofia, PA-C  ibuprofen (ADVIL,MOTRIN) 800 MG tablet Take 1 tablet (800 mg total) by mouth 3 (three) times daily. 10/02/14   Elpidio AnisShari Upstill, PA-C  naproxen (NAPROSYN) 375 MG tablet Take 1 tablet (375 mg total) by mouth 2 (two) times daily. Patient not taking: Reported on 10/02/2014 09/02/14   Marissa Sciacca, PA-C  oxyCODONE-acetaminophen (PERCOCET/ROXICET) 5-325 MG per tablet Take 1-2 tablets by mouth every 6 (six) hours as needed for severe pain. 10/02/14   Elpidio AnisShari Upstill, PA-C  phenytoin (DILANTIN) 100 MG ER capsule Take 400 mg by mouth at bedtime.     Historical Provider, MD   BP 114/59 mmHg  Pulse 75  Temp(Src) 98.3 F (36.8 C) (Oral)  Resp 19  SpO2 97%  LMP 10/30/2014 Physical Exam  Constitutional: She appears well-developed and well-nourished. No distress.  Moderately obese female nontoxic in appearance, sounds congested.  HENT:  Head: Atraumatic.  Eyes: Conjunctivae are normal.  Neck: Neck supple.  Cardiovascular: Normal rate and regular rhythm.   Pulmonary/Chest: Effort normal and breath sounds normal. No respiratory distress. She has no wheezes. She has no rales. She exhibits no tenderness.  Abdominal: Soft. There is tenderness (  Diffuse abdominal tenderness most significant to mid abdomen. Negative Murphy sign, no pain at McBurney's point, no peritoneal signs.). There is no rebound and no guarding.  Hyper active bowel sounds.  Genitourinary:  Chaperone present during exam. No inguinal lymphadenopathy or inguinal hernia noted. Normal external genitalia. Vaginal vault with functional discharge. Closed cervical os. No adnexal tenderness or cervical motion  tenderness.  Neurological: She is alert.  Skin: No rash noted.  Psychiatric: She has a normal mood and affect.  Nursing note and vitals reviewed.   ED Course  Procedures (including critical care time)  Patient with symptoms suggestive of a viral etiology. Workup initiated.  1:08 PM Patient urine has moderate leukocyte esterase, Many squamous epithelial and many bacteria. WBC 7-10. Patient denies having dysuria but does complain of a urine odor. Will obtain pelvic examination for further evaluation.  2:25 PM Pelvic exam performed by me. No significant discomfort to suggest PID.  Patient does have some mild discomfort with bimanual exam. Given that she is sexually active and therefore increased risk of infection, will give Rocephin and Zithromax prophylaxis here. I still suspect her symptoms is likely viral GI. Otherwise patient is well-appearing with a nonsurgical abdomen. She is able to tolerates by mouth. Anticipate the patient can be discharged. Return precaution given.  Labs Review Labs Reviewed  WET PREP, GENITAL - Abnormal; Notable for the following:    Clue Cells Wet Prep HPF POC FEW (*)    WBC, Wet Prep HPF POC MODERATE (*)    All other components within normal limits  COMPREHENSIVE METABOLIC PANEL - Abnormal; Notable for the following:    CO2 21 (*)    Calcium 8.6 (*)    All other components within normal limits  URINALYSIS, ROUTINE W REFLEX MICROSCOPIC (NOT AT [facility]) - Abnormal; Notable for the following:    APPearance CLOUDY (*)    Leukocytes, UA MODERATE (*)    All other components within normal limits  LIPASE, BLOOD - Abnormal; Notable for the following:    Lipase 15 (*)    All other components within normal limits  URINE MICROSCOPIC-ADD ON - Abnormal; Notable for the following:    Squamous Epithelial / LPF MANY (*)    Bacteria, UA MANY (*)    Casts HYALINE CASTS (*)    All other components within normal limits  CBC WITH DIFFERENTIAL/PLATELET  RPR  HIV ANTIBODY  (ROUTINE TESTING)  POC URINE PREG, ED  GC/CHLAMYDIA PROBE AMP () NOT AT [facility]    Imaging Review No results found.   EKG Interpretation None      MDM   Final diagnoses:  Nausea vomiting and diarrhea    BP 99/58 mmHg  Pulse 50  Temp(Src) 98.8 F (37.1 C) (Oral)  Resp 18  SpO2 100%  LMP 10/30/2014  I have reviewed nursing notes and vital signs. I personally reviewed the imaging tests through PACS system  I reviewed available ER/hospitalization records thought the EMR \    Fayrene Helper, PA-C 11/13/14 1523  Geoffery Lyons, MD 11/13/14 [phone]

## 2014-11-13 NOTE — Discharge Instructions (Signed)
Please follow the instructions below for further management of condition. Take antinausea medication as needed. You have been treated for possible STD. If results shows any signs of specific infection, you'll be notified the next several days. Return if the condition worsened.  Viral Gastroenteritis Viral gastroenteritis is also known as stomach flu. This condition affects the stomach and intestinal tract. It can cause sudden diarrhea and vomiting. The illness typically lasts 3 to 8 days. Most people develop an immune response that eventually gets rid of the virus. While this natural response develops, the virus can make you quite ill. CAUSES  Many different viruses can cause gastroenteritis, such as rotavirus or noroviruses. You can catch one of these viruses by consuming contaminated food or water. You may also catch a virus by sharing utensils or other personal items with an infected person or by touching a contaminated surface. SYMPTOMS  The most common symptoms are diarrhea and vomiting. These problems can cause a severe loss of body fluids (dehydration) and a body salt (electrolyte) imbalance. Other symptoms may include:  Fever.  Headache.  Fatigue.  Abdominal pain. DIAGNOSIS  Your caregiver can usually diagnose viral gastroenteritis based on your symptoms and a physical exam. A stool sample may also be taken to test for the presence of viruses or other infections. TREATMENT  This illness typically goes away on its own. Treatments are aimed at rehydration. The most serious cases of viral gastroenteritis involve vomiting so severely that you are not able to keep fluids down. In these cases, fluids must be given through an intravenous line (IV). HOME CARE INSTRUCTIONS   Drink enough fluids to keep your urine clear or pale yellow. Drink small amounts of fluids frequently and increase the amounts as tolerated.  Ask your caregiver for specific rehydration instructions.  Avoid:  Foods  high in sugar.  Alcohol.  Carbonated drinks.  Tobacco.  Juice.  Caffeine drinks.  Extremely hot or cold fluids.  Fatty, greasy foods.  Too much intake of anything at one time.  Dairy products until 24 to 48 hours after diarrhea stops.  You may consume probiotics. Probiotics are active cultures of beneficial bacteria. They may lessen the amount and number of diarrheal stools in adults. Probiotics can be found in yogurt with active cultures and in supplements.  Wash your hands well to avoid spreading the virus.  Only take over-the-counter or prescription medicines for pain, discomfort, or fever as directed by your caregiver. Do not give aspirin to children. Antidiarrheal medicines are not recommended.  Ask your caregiver if you should continue to take your regular prescribed and over-the-counter medicines.  Keep all follow-up appointments as directed by your caregiver. SEEK IMMEDIATE MEDICAL CARE IF:   You are unable to keep fluids down.  You do not urinate at least once every 6 to 8 hours.  You develop shortness of breath.  You notice blood in your stool or vomit. This may look like coffee grounds.  You have abdominal pain that increases or is concentrated in one small area (localized).  You have persistent vomiting or diarrhea.  You have a fever.  The patient is a child younger than 3 months, and he or she has a fever.  The patient is a child older than 3 months, and he or she has a fever and persistent symptoms.  The patient is a child older than 3 months, and he or she has a fever and symptoms suddenly get worse.  The patient is a baby, and he or  she has no tears when crying. MAKE SURE YOU:   Understand these instructions.  Will watch your condition.  Will get help right away if you are not doing well or get worse. Document Released: 06/01/2005 Document Revised: 08/24/2011 Document Reviewed: 03/18/2011 [facility] Patient Information 2015 [place], [place].  This information is not intended to replace advice given to you by your health care provider. Make sure you discuss any questions you have with your health care provider.

## 2014-11-13 NOTE — ED Notes (Signed)
Pt info me do not have to use restroom at this time but will info staff when can give urine sample.

## 2014-11-14 LAB — RPR: RPR Ser Ql: NONREACTIVE

## 2014-11-14 LAB — HIV ANTIBODY (ROUTINE TESTING W REFLEX): HIV Screen 4th Generation wRfx: NONREACTIVE

## 2014-11-14 LAB — GC/CHLAMYDIA PROBE AMP (~~LOC~~) NOT AT ARMC
Chlamydia: NEGATIVE
Neisseria Gonorrhea: NEGATIVE

## 2015-02-22 ENCOUNTER — Emergency Department (HOSPITAL_COMMUNITY)
Admission: EM | Admit: 2015-02-22 | Discharge: 2015-02-22 | Disposition: A | Discharge status: Home / Self Care | Payer: Self-pay | Attending: Emergency Medicine | Admitting: Emergency Medicine

## 2015-02-22 ENCOUNTER — Encounter (HOSPITAL_COMMUNITY): Payer: Self-pay | Admitting: Emergency Medicine

## 2015-02-22 ENCOUNTER — Emergency Department (HOSPITAL_COMMUNITY): Payer: Self-pay

## 2015-02-22 DIAGNOSIS — Z8709 Personal history of other diseases of the respiratory system: Secondary | ICD-10-CM | POA: Insufficient documentation

## 2015-02-22 DIAGNOSIS — Z8659 Personal history of other mental and behavioral disorders: Secondary | ICD-10-CM | POA: Insufficient documentation

## 2015-02-22 DIAGNOSIS — Y9389 Activity, other specified: Secondary | ICD-10-CM | POA: Insufficient documentation

## 2015-02-22 DIAGNOSIS — M546 Pain in thoracic spine: Secondary | ICD-10-CM

## 2015-02-22 DIAGNOSIS — S0990XA Unspecified injury of head, initial encounter: Secondary | ICD-10-CM | POA: Insufficient documentation

## 2015-02-22 DIAGNOSIS — Z791 Long term (current) use of non-steroidal anti-inflammatories (NSAID): Secondary | ICD-10-CM | POA: Insufficient documentation

## 2015-02-22 DIAGNOSIS — G40909 Epilepsy, unspecified, not intractable, without status epilepticus: Secondary | ICD-10-CM | POA: Insufficient documentation

## 2015-02-22 DIAGNOSIS — Z79899 Other long term (current) drug therapy: Secondary | ICD-10-CM | POA: Insufficient documentation

## 2015-02-22 DIAGNOSIS — Y92009 Unspecified place in unspecified non-institutional (private) residence as the place of occurrence of the external cause: Secondary | ICD-10-CM | POA: Insufficient documentation

## 2015-02-22 DIAGNOSIS — Y998 Other external cause status: Secondary | ICD-10-CM | POA: Insufficient documentation

## 2015-02-22 DIAGNOSIS — S7012XA Contusion of left thigh, initial encounter: Secondary | ICD-10-CM | POA: Insufficient documentation

## 2015-02-22 DIAGNOSIS — Z8719 Personal history of other diseases of the digestive system: Secondary | ICD-10-CM | POA: Insufficient documentation

## 2015-02-22 DIAGNOSIS — Z87891 Personal history of nicotine dependence: Secondary | ICD-10-CM | POA: Insufficient documentation

## 2015-02-22 DIAGNOSIS — S299XXA Unspecified injury of thorax, initial encounter: Secondary | ICD-10-CM | POA: Insufficient documentation

## 2015-02-22 DIAGNOSIS — S0592XA Unspecified injury of left eye and orbit, initial encounter: Secondary | ICD-10-CM | POA: Insufficient documentation

## 2015-02-22 MED ORDER — HYDROCODONE-ACETAMINOPHEN 5-325 MG PO TABS
1.0000 | ORAL_TABLET | Freq: Once | ORAL | Status: AC
  Administered 2015-02-22: 1 via ORAL
  Filled 2015-02-22: qty 1
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
## 2015-02-22 NOTE — ED Notes (Signed)
Patient is resting comfortably. 
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
## 2015-03-19 ENCOUNTER — Encounter (HOSPITAL_COMMUNITY): Payer: Self-pay | Admitting: Emergency Medicine

## 2015-03-19 ENCOUNTER — Emergency Department (HOSPITAL_COMMUNITY): Payer: Self-pay

## 2015-03-19 ENCOUNTER — Emergency Department (HOSPITAL_COMMUNITY)
Admission: EM | Admit: 2015-03-19 | Discharge: 2015-03-19 | Disposition: A | Discharge status: Home / Self Care | Payer: Self-pay | Attending: Emergency Medicine | Admitting: Emergency Medicine

## 2015-03-19 DIAGNOSIS — Z8659 Personal history of other mental and behavioral disorders: Secondary | ICD-10-CM | POA: Insufficient documentation

## 2015-03-19 DIAGNOSIS — M25532 Pain in left wrist: Secondary | ICD-10-CM | POA: Insufficient documentation

## 2015-03-19 DIAGNOSIS — Z8709 Personal history of other diseases of the respiratory system: Secondary | ICD-10-CM | POA: Insufficient documentation

## 2015-03-19 DIAGNOSIS — Z87891 Personal history of nicotine dependence: Secondary | ICD-10-CM | POA: Insufficient documentation

## 2015-03-19 DIAGNOSIS — Z8719 Personal history of other diseases of the digestive system: Secondary | ICD-10-CM | POA: Insufficient documentation

## 2015-03-19 MED ORDER — IBUPROFEN 800 MG PO TABS: 800.0000 mg | ORAL_TABLET | Freq: Three times a day (TID) | ORAL | Status: DC

## 2015-03-19 NOTE — Discharge Instructions (Signed)
1. Medications: ibuprofen, usual home medications 2. Treatment: rest, drink plenty of fluids, ice and elevate left wrist 3. Follow Up: please followup with your primary doctor for discussion of your diagnoses and further evaluation after today's visit and with hand surgeon with new or worsening symptoms; if you do not have a primary care doctor use the resource guide provided to find one; please return to the ER for severe pain or swelling, numbness, color change, new or worsening symptoms   Emergency Department Resource Guide 1) Find a Doctor and Pay Out of Pocket Although you won't have to find out who is covered by your insurance plan, it is a good idea to ask around and get recommendations. You will then need to call the office and see if the doctor you have chosen will accept you as a new patient and what types of options they offer for patients who are self-pay. Some doctors offer discounts or will set up payment plans for their patients who do not have insurance, but you will need to ask so you aren't surprised when you get to your appointment.  2) Contact Your Local Health Department Not all health departments have doctors that can see patients for sick visits, but many do, so it is worth a call to see if yours does. If you don't know where your local health department is, you can check in your phone book. The CDC also has a tool to help you locate your state's health department, and many state websites also have listings of all of their local health departments.  3) Find a Walk-in Clinic If your illness is not likely to be very severe or complicated, you may want to try a walk in clinic. These are popping up all over the country in pharmacies, drugstores, and shopping centers. They're usually staffed by nurse practitioners or physician assistants that have been trained to treat common illnesses and complaints. They're usually fairly quick and inexpensive. However, if you have serious medical  issues or chronic medical problems, these are probably not your best option.  No Primary Care Doctor: - Call Health Connect at  [phone] - they can help you locate a primary care doctor that  accepts your insurance, provides certain services, etc. - Physician Referral Service- [phone]  Chronic Pain Problems: Organization         Address  Phone   Notes  [facility]  [phone] Patients need to be referred by their primary care doctor.   Medication Assistance: Organization         Address  Phone   Notes  [facility] Medication [facility] [address] [place] [place], [place] [address] [phone] --Must be a resident of [facility] -- Must have NO insurance coverage whatsoever (no Medicaid/ Medicare, etc.) -- The pt. MUST have a primary care doctor that directs their care regularly and follows them in the community   [facility]  [phone]   [employer]  [phone]    Agencies that provide inexpensive medical care: Organization         Address  Phone   Notes  [facility]  [phone]   [facility]    [phone]   [facility] [address] [place], [place] [address] [phone]   [facility] [address] [place], [place] [phone]   Planned Parenthood    [phone]   [facility]    [phone]   [place] [address], [place] Phone:  [phone], Fax:  [phone] Hours of Operation:  9 am - 6 pm, M-F.  Also accepts Medicaid/Medicare and self-pay.  [facility] [place], [address], [place] Phone: [phone], Fax: [phone]. Hours of Operation:  8:30 am - 5:30 pm, M-F.  Also accepts Medicaid and self-pay.  [facility] [address] [place] Phone: [phone]   [place], [place], [place]  [phone], Ext. [phone] Mondays & Thursdays: 7-9 AM.  First 15 patients are seen on a first come, first serve basis.    [place] Providers:  Organization         Address  Phone   Notes  [facility] [address], [place] [phone] Also accepts self-pay patients.  [facility] [address], [place]  [phone]   [place], [address], [place] [phone]   [facility] [address] [place] [phone]   Lucianne Lei [address], [place]   [phone] Only accepts [place] Access [place] patients after they have their name applied to their card.   Self-Pay (no insurance) in [facility]:  Organization         Address  Phone   Notes  Sickle Cell Patients, [facility] [place] [phone]   [facility] [place] [phone]   [facility] [place]  [place], [place], [place] [phone]   [facility]/Dr. Osei-Bonsu  [address], [place] or [address], [place] [phone] Phone number for both [place] and [place] locations is the same.  [facility] [address] [place] [phone]   [facility] [address] [place] or [address] [phone]   [facility] [address] [place] [phone], phone; [phone], fax Sees patients 1st and 3rd Saturday of every month.  Must not qualify for public or private insurance (i.e. Medicaid, Medicare, [place] Health Choice, Veterans' Benefits)  Household income should be no more than 200% of the poverty level The clinic cannot treat you if you are pregnant or think you are pregnant  Sexually transmitted  diseases are not treated at the clinic.    Dental Care: Organization         Address  Phone  Notes  [facility] Department of [facility] [phone] Accepts children up to age 10 who are enrolled in [place] or [place]; pregnant women with a Medicaid card; and children who have applied for Medicaid or [place] Health Choice, but were declined, whose parents can pay a reduced fee at time of service.  [facility] of [facility]  [address], [place] [phone] Accepts children up to age 22 who are enrolled in [place] or [place]; pregnant women with a Medicaid card; and children who have applied for Medicaid or [place] Health Choice, but were declined, whose parents can pay a reduced fee at time of service.  [place]  Access PROGRAM  [address] [place] [phone] Patients are seen by appointment only. Walk-ins are not accepted. [place] will see patients 57 years of age and older. Monday - Tuesday (8am-5pm) Most Wednesdays (8:30-5pm) $30 per visit, cash only  [facility] Adult Dental Access PROGRAM  [address], [facility] [facility] [phone] Patients are seen by appointment only. Walk-ins are not accepted. [place] will see patients 61 years of age and older. One Wednesday Evening (Monthly: Volunteer Based).  $30 per visit, cash only  [place]  [phone] for adults; Children under age 78, call Graduate Pediatric Dentistry at [phone]. Children aged 60-14, please call [phone] to request a pediatric application.  Dental services are provided in all areas of dental care including fillings, crowns and bridges, complete and partial dentures, implants, gum treatment, root canals, and extractions. Preventive care is also provided. Treatment is provided to both adults and children. Patients are selected via a  lottery and there is often a waiting list.   [facility] [address] [place]  [phone] [email or web address]   [facility] [address] [place], [place] [phone], Ext. [phone] Second and Fourth Thursday of each month, opens at 6:30 AM; Clinic ends at 9 AM.  Patients are seen on a first-come first-served basis, and a limited number are seen during each clinic.   [facility]  [address] Hillard Danker [place], [place] [phone]   Eligibility Requirements You must have lived in [place], [place], or [place] counties for at least the last three months.   You cannot be eligible for state or federal sponsored [employer], including [employer], [place], or [employer].   You generally cannot be eligible for healthcare insurance through your employer.    How to apply: Eligibility screenings are held every Tuesday and Wednesday afternoon from 1:00 pm until 4:00 pm. You do not need an appointment for the interview!  [facility] [address], [place], [place]   [place]  [place] Department  [place]  [phone]    Behavioral Health Resources in the Community: Intensive Outpatient Programs Organization         Address  Phone  Notes  [place], [place], [place] [phone]   [facility] Outpatient [address], [place], [place]   ADS: Alcohol & Drug Svcs [address], [place], [place]   [place] [address]  [place], [place] or [phone]   Substance Abuse Resources Organization         Address  Phone  Notes  Alcohol and Drug Services  [phone]   [place]  [phone]   [facility] [place]   Chinita Pester  [phone]   Residential &  Outpatient Substance Abuse Program  [phone]   Psychological Services Organization         Address  Phone  Notes  [facility] [place]  [place]  [phone]   [place] [address] [place] or [phone]    Mobile Crisis Teams Organization         Address  Phone  Notes  [facility], Mobile Crisis Care Unit  [phone]   [facility]  [address] [place],  [place] [address] [place] [phone]    Self-Help/Support Groups Organization         Address  Phone             Notes  [facility] [place] - variety of support groups  [place] Call for more information  Narcotics Anonymous (NA), [facility]aring [facility] [address], [employer] [place]  2 meetings at this location   Special educational needs teacher         Address  Phone  Notes  [facility] Residential Treatment [place],    [place]  [phone]   [facility]  [address] [place] [phone], [place], [place]   [place], [place] [phone] Admissions: 8am-3pm M-F  Incentives Substance [place] [address],    [place], [place] [phone]   [facility] [address] [place], [place], [place]   [facility] [address],  [place], [place]   [facility] Programs - Intensive Outpatient [address]een [address], [place], [place]   [facility] ([place]  [place], [place] [phone] or [phone]   [facility] Services (RTS) [address], [place], [place] Accepts Medicaid  [facility] [place] [address],  [place] [phone] Substance Abuse/Addiction Treatment   [facility] Organization          Address  Phone  Notes  [facility]  [phone]   Domenic Schwab, PhD [address] Arlis Porta [place], [place]   [phone] or [phone]   [place], [place] [phone]   [facility] [address], [place], [place] [phone] Insurance/Medicaid/sponsorship through [facility] and Families [address]                                    [place], [place] [phone] [place] [address] [place], [place] [phone]    Dr. Adele Schilder  [phone]   [facility] Dept. 1) [address], [place] 2) [place] 3)  [place] [address], [place] [phone]  [phone]   [place] [phone] or [phone] (After Hours)

## 2015-03-19 NOTE — ED Provider Notes (Signed)
CSN: [id number]     Arrival date & time 03/19/15  1542 History   By signing my name below, I, Jarvis Morgan, attest that this documentation has been prepared under the direction and in the presence of Mady Gemma, PA-C Electronically Signed: Jarvis Morgan, ED Scribe. 03/19/2015. 5:19 PM.    Chief Complaint  Patient presents with  . Hand Pain   The history is provided by the patient. No language interpreter was used.     HPI Comments: KAIDENCE SANT is a 29 y.o. female with a PMH of seizures who presents to the Emergency Department complaining of sudden onset, constant, moderate, throbbing left wrist pain that began 3 days ago. She denies recent injury. She states she works at [employer] and uses her hands often during work. She states her pain is exacerbated with movement. She has tried ibuprofen with no symptom relief; her last dose was 4 hours ago. She denies numbness, paresthesia, weakness, fever, chills, lightheadedness, dizziness, chest pain, SOB, abdominal pain, nausea, vomiting, or diarrhea. She denies recent seizure.   Past Medical History  Diagnosis Date  . Seizures (HCC)   . PTSD (post-traumatic stress disorder)   . Depression   . Anxiety   . Chronic diarrhea   . Collapsed lung    Past Surgical History  Procedure Laterality Date  . Cesarean section    . Tubal ligation     No family history on file. Social History  Substance Use Topics  . Smoking status: Former Smoker    Quit date: 11/11/2012  . Smokeless tobacco: Never Used  . Alcohol Use: No   OB History    No data available      Review of Systems  Constitutional: Negative for fever and chills.  Respiratory: Negative for shortness of breath.   Cardiovascular: Negative for chest pain.  Gastrointestinal: Negative for nausea, vomiting, abdominal pain and diarrhea.  Musculoskeletal: Positive for arthralgias.  Neurological: Negative for dizziness, weakness, light-headedness and numbness.  All other  systems reviewed and are negative.     Allergies  Sulfa antibiotics  Home Medications   Prior to Admission medications   Medication Sig Start Date End Date Taking? Authorizing Provider  amoxicillin (AMOXIL) 500 MG capsule Take 1 capsule (500 mg total) by mouth 3 (three) times daily. Patient not taking: Reported on 05/15/2014 04/12/14   Elson Areas, PA-C  HYDROcodone-acetaminophen (NORCO/VICODIN) 5-325 MG per tablet Take 2 tablets by mouth every 4 (four) hours as needed. Patient not taking: Reported on 05/15/2014 04/12/14   Elson Areas, PA-C  ibuprofen (ADVIL,MOTRIN) 800 MG tablet Take 1 tablet (800 mg total) by mouth 3 (three) times daily. 03/19/15   Mady Gemma, PA-C  naproxen (NAPROSYN) 375 MG tablet Take 1 tablet (375 mg total) by mouth 2 (two) times daily. Patient not taking: Reported on 02/22/2015 11/13/14   Fayrene Helper, PA-C  ondansetron (ZOFRAN) 4 MG tablet Take 1 tablet (4 mg total) by mouth every 6 (six) hours. Patient not taking: Reported on 02/22/2015 11/13/14   Fayrene Helper, PA-C  oxyCODONE-acetaminophen (PERCOCET/ROXICET) 5-325 MG per tablet Take 1-2 tablets by mouth every 6 (six) hours as needed for severe pain. Patient not taking: Reported on 02/22/2015 10/02/14   Elpidio Anis, PA-C   Triage Vitals: BP 137/82 mmHg  Pulse 88  Temp(Src) 98 F (36.7 C) (Oral)  Resp 16  SpO2 100%  LMP 03/05/2015  Physical Exam  Constitutional: She is oriented to person, place, and time. She appears well-developed and well-nourished.  No distress.  HENT:  Head: Normocephalic and atraumatic.  Right Ear: External ear normal.  Left Ear: External ear normal.  Nose: Nose normal.  Mouth/Throat: Oropharynx is clear and moist.  Eyes: Conjunctivae and EOM are normal. Pupils are equal, round, and reactive to light. Right eye exhibits no discharge. Left eye exhibits no discharge. No scleral icterus.  Neck: Normal range of motion. Neck supple. Tracheal deviation present.  Cardiovascular:  Normal rate, regular rhythm, normal heart sounds and intact distal pulses.   Cap refill < 3 seconds  Pulmonary/Chest: Effort normal and breath sounds normal. No respiratory distress. She has no wheezes. She has no rales.  Musculoskeletal: She exhibits tenderness. She exhibits no edema.       Left wrist: She exhibits decreased range of motion (due to pain) and tenderness.  Mild TTP to ulnar aspect of left wrist. No significant edema or erythema. No palpable deformity. Decreased ROM due to pain, though patient is able to move wrist and fingers with flexion and extension. Decreased grip strength of left hand due to pain. Sensation intact. Distal pulses intact. Cap refill <3 seconds.  Neurological: She is alert and oriented to person, place, and time.  Skin: Skin is warm and dry. No rash noted. She is not diaphoretic. No erythema. No pallor.  Psychiatric: She has a normal mood and affect. Her behavior is normal. Judgment and thought content normal.  Nursing note and vitals reviewed.   ED Course  Procedures (including critical care time)  DIAGNOSTIC STUDIES: Oxygen Saturation is 100% on RA, normal by my interpretation.    COORDINATION OF CARE: 4:05 PM- Will order imaging of left hand. Pt advised of plan for treatment and pt agrees.  Labs Review Labs Reviewed - No data to display  Imaging Review Dg Hand Complete Left  03/19/2015   CLINICAL DATA:  Left hand pain since 3 days ago.  EXAM: LEFT HAND - COMPLETE 3+ VIEW  COMPARISON:  01/07/2014  FINDINGS: There is no evidence of fracture or dislocation. There is no evidence of arthropathy or other focal bone abnormality. Soft tissues are unremarkable.  IMPRESSION: Normal exam.   Electronically Signed   By: Francene Boyers M.D.   On: 03/19/2015 16:45   I have personally reviewed and evaluated these images and lab results as part of my medical decision-making.   EKG Interpretation None      MDM   Final diagnoses:  Left wrist pain    29 year  old female presents with left wrist pain, which started 3 days ago. She denies recent injury. She denies recent seizure. She denies numbness, weakness, paresthesia. She states she uses her hands at work (she works at [employer]).  Patient is afebrile. Vital signs stable. Mild TTP of ulnar aspect of left wrist with decreased range of motion due to pain. Decreased grip strength due to pain. No significant edema or erythema. No palpable deformity. Sensation intact. Distal pulses intact. Cap refill <3 seconds.  Imaging negative for fracture, dislocation, arthropathy, bone abnormality, soft tissue abnormality. Will apply splint and treat with ibuprofen and RICE therapy. Patient to follow-up with PCP. Return precautions discussed.  BP 137/82 mmHg  Pulse 83  Temp(Src) 98 F (36.7 C) (Oral)  Resp 16  SpO2 99%  LMP 03/05/2015       Mady Gemma, PA-C 03/20/15 1847  Lorre Nick, MD 03/21/15 [phone]

## 2015-03-19 NOTE — ED Notes (Signed)
Per pt, states seizure 3 days ago, now having left hand pain-does not know if she injured it during seizure

## 2015-03-22 ENCOUNTER — Emergency Department (HOSPITAL_COMMUNITY)
Admission: EM | Admit: 2015-03-22 | Discharge: 2015-03-22 | Disposition: A | Discharge status: Home / Self Care | Payer: Self-pay | Attending: Emergency Medicine | Admitting: Emergency Medicine

## 2015-03-22 ENCOUNTER — Encounter (HOSPITAL_COMMUNITY): Payer: Self-pay | Admitting: Emergency Medicine

## 2015-03-22 ENCOUNTER — Emergency Department (HOSPITAL_COMMUNITY): Payer: Self-pay

## 2015-03-22 DIAGNOSIS — Y9289 Other specified places as the place of occurrence of the external cause: Secondary | ICD-10-CM | POA: Insufficient documentation

## 2015-03-22 DIAGNOSIS — Z8659 Personal history of other mental and behavioral disorders: Secondary | ICD-10-CM | POA: Insufficient documentation

## 2015-03-22 DIAGNOSIS — W228XXA Striking against or struck by other objects, initial encounter: Secondary | ICD-10-CM | POA: Insufficient documentation

## 2015-03-22 DIAGNOSIS — Y9301 Activity, walking, marching and hiking: Secondary | ICD-10-CM | POA: Insufficient documentation

## 2015-03-22 DIAGNOSIS — Z791 Long term (current) use of non-steroidal anti-inflammatories (NSAID): Secondary | ICD-10-CM | POA: Insufficient documentation

## 2015-03-22 DIAGNOSIS — Z8719 Personal history of other diseases of the digestive system: Secondary | ICD-10-CM | POA: Insufficient documentation

## 2015-03-22 DIAGNOSIS — Y998 Other external cause status: Secondary | ICD-10-CM | POA: Insufficient documentation

## 2015-03-22 DIAGNOSIS — Z8709 Personal history of other diseases of the respiratory system: Secondary | ICD-10-CM | POA: Insufficient documentation

## 2015-03-22 DIAGNOSIS — Z87891 Personal history of nicotine dependence: Secondary | ICD-10-CM | POA: Insufficient documentation

## 2015-03-22 DIAGNOSIS — S60221A Contusion of right hand, initial encounter: Secondary | ICD-10-CM | POA: Insufficient documentation

## 2015-03-22 MED ORDER — MORPHINE SULFATE (PF) 4 MG/ML IV SOLN
4.0000 mg | Freq: Once | INTRAVENOUS | Status: AC
  Administered 2015-03-22: 4 mg via INTRAMUSCULAR
  Filled 2015-03-22: qty 1

## 2015-03-22 MED ORDER — HYDROCODONE-ACETAMINOPHEN 5-325 MG PO TABS: ORAL_TABLET | ORAL | Status: DC

## 2015-03-22 NOTE — Discharge Instructions (Signed)
Rest, Ice intermittently (in the first 24-48 hours), Gentle compression with an Ace wrap, and elevate (Limb above the level of the heart)   Take up to 800mg  of ibuprofen (that is usually 4 over the counter pills)  3 times a day for 5 days. Take with food.  Take vicodin for breakthrough pain, do not drink alcohol, drive, care for children or do other critical tasks while taking vicodin.  Only use the arm sling for up to 2 days. Take the arm out and rotate the shoulder every 4 hours.   Do not hesitate to return to the emergency room for any new, worsening or concerning symptoms.  Please obtain primary care using resource guide below. Let them know that you were seen in the emergency room and that they will need to obtain records for further outpatient management.   Contusion A contusion is a deep bruise. Contusions are the result of a blunt injury to tissues and muscle fibers under the skin. The injury causes bleeding under the skin. The skin overlying the contusion may turn blue, purple, or yellow. Minor injuries will give you a painless contusion, but more severe contusions may stay painful and swollen for a few weeks.  CAUSES  This condition is usually caused by a blow, trauma, or direct force to an area of the body. SYMPTOMS  Symptoms of this condition include:  Swelling of the injured area.  Pain and tenderness in the injured area.  Discoloration. The area may have redness and then turn blue, purple, or yellow. DIAGNOSIS  This condition is diagnosed based on a physical exam and medical history. An X-ray, CT scan, or MRI may be needed to determine if there are any associated injuries, such as broken bones (fractures). TREATMENT  Specific treatment for this condition depends on what area of the body was injured. In general, the best treatment for a contusion is resting, icing, applying pressure to (compression), and elevating the injured area. This is often called the RICE strategy.  Over-the-counter anti-inflammatory medicines may also be recommended for pain control.  HOME CARE INSTRUCTIONS   Rest the injured area.  If directed, apply ice to the injured area:  Put ice in a plastic bag.  Place a towel between your skin and the bag.  Leave the ice on for 20 minutes, 2-3 times per day.  If directed, apply light compression to the injured area using an elastic bandage. Make sure the bandage is not wrapped too tightly. Remove and reapply the bandage as directed by your health care provider.  If possible, raise (elevate) the injured area above the level of your heart while you are sitting or lying down.  Take over-the-counter and prescription medicines only as told by your health care provider. SEEK MEDICAL CARE IF:  Your symptoms do not improve after several days of treatment.  Your symptoms get worse.  You have difficulty moving the injured area. SEEK IMMEDIATE MEDICAL CARE IF:   You have severe pain.  You have numbness in a hand or foot.  Your hand or foot turns pale or cold.   This information is not intended to replace advice given to you by your health care provider. Make sure you discuss any questions you have with your health care provider.   Document Released: 03/11/2005 Document Revised: 02/20/2015 Document Reviewed: 10/17/2014 Elsevier Interactive Patient Education 2016 [employer].   Emergency Department Resource Guide 1) Find a Doctor and Pay Out of Pocket Although you won't have to find out  who is covered by your insurance plan, it is a good idea to ask around and get recommendations. You will then need to call the office and see if the doctor you have chosen will accept you as a new patient and what types of options they offer for patients who are self-pay. Some doctors offer discounts or will set up payment plans for their patients who do not have insurance, but you will need to ask so you aren't surprised when you get to your  appointment.  2) Contact Your Local Health Department Not all health departments have doctors that can see patients for sick visits, but many do, so it is worth a call to see if yours does. If you don't know where your local health department is, you can check in your phone book. The CDC also has a tool to help you locate your state's health department, and many state websites also have listings of all of their local health departments.  3) Find a Walk-in Clinic If your illness is not likely to be very severe or complicated, you may want to try a walk in clinic. These are popping up all over the country in pharmacies, drugstores, and shopping centers. They're usually staffed by nurse practitioners or physician assistants that have been trained to treat common illnesses and complaints. They're usually fairly quick and inexpensive. However, if you have serious medical issues or chronic medical problems, these are probably not your best option.  No Primary Care Doctor: - Call Health Connect at  [phone] - they can help you locate a primary care doctor that  accepts your insurance, provides certain services, etc. - Physician Referral Service- [phone]  Chronic Pain Problems: Organization         Address  Phone   Notes  [facility]  [phone] Patients need to be referred by their primary care doctor.   Medication Assistance: Organization         Address  Phone   Notes  [facility] Medication [facility] [address] [place] [place], [place] [address] [phone] --Must be a resident of [facility] -- Must have NO insurance coverage whatsoever (no Medicaid/ Medicare, etc.) -- The pt. MUST have a primary care doctor that directs their care regularly and follows them in the community   [facility]  [phone]   [employer]  [phone]    Agencies that provide inexpensive medical care: Organization         Address  Phone   Notes  [facility]  [phone]   [facility]    [phone]   [facility] [address] [place], [place] [address] [phone]   [facility] [address] [place], [place] [phone]   Planned Parenthood    [phone]   [facility]    [phone]   [facility] and [facility]  [address], [place] Phone:  [phone], Fax:  [phone] Hours of Operation:  9 am - 6 pm, M-F.  Also accepts Medicaid/Medicare and self-pay.  [facility]  [address], [place] Phone: [phone], Fax: [phone]. Hours of Operation:  8:30 am - 5:30 pm, M-F.  Also accepts Medicaid and self-pay.  [facility] [place] [address] [employer] Phone: [phone]   [facility] [address] [place], [place] (  [phone], Ext. [phone] Mondays & Thursdays: 7-9 AM.  First 15 patients are seen on a first come, first serve basis.    Medicaid-accepting [facility] Providers:  Organization         Address  Phone   Notes  [facility] [address], [place] [phone] Also accepts self-pay patients.  [facility] [address] Laurell Josephs [place], [place]  [phone]   [facility] [address], [place] [phone]   [facility] [address], [place] [phone]   Renaye Rakers [address], [place]   [phone] Only accepts [place] Access [place] patients after they have their name applied to their card.   Self-Pay (no insurance) in [facility]:  Organization         Address  Phone   Notes  Sickle Cell Patients, [facility] Internal Medicine [address] [place], [place] [phone]   [facility] [address] [place], [place] [phone]   [facility]  [place]  [address], [place] [phone]   [facility]/Dr. Osei-Bonsu  [address], [place] or [address], [place] [phone] Phone number for both [place] and [place] locations is the same.  [facility]gent [facility] and [facility] [address] [place] [phone]   [facility] [address] [place] or [address] [phone]   [facility] [address] [place] [address], [place] [phone], phone; [phone], fax Sees patients 1st and 3rd Saturday of every month.  Must not qualify for public or private insurance (i.e. Medicaid, Medicare, [place] Health Choice, Veterans' Benefits)  Household income should be no more than 200% of the poverty level The clinic cannot treat you if you are pregnant or think you are pregnant  Sexually transmitted diseases are not treated at the clinic.    Dental Care: Organization         Address  Phone  Notes  [facility] Department of [facility] [address] [place], [place] [phone] Accepts children up to age 75 who are enrolled in [place] or [place] Health Choice; pregnant women with a Medicaid card; and children who have applied for Medicaid or [place] Health Choice, but were declined, whose parents can pay a reduced fee at time of service.  [facility] Department of [facility]  [address], [place] [phone] Accepts children up to age 19 who are enrolled in [place] or [place] Health Choice; pregnant women with a Medicaid card; and children who have applied for Medicaid or [place] Health Choice, but were declined, whose parents can pay a reduced fee at time of service.  [facility]  [address] [place], [place] [phone] Patients are seen by appointment only. Walk-ins are not accepted. [facility] will see patients 77 years of age and  older. Monday - Tuesday (8am-5pm) Most Wednesdays (8:30-5pm) $30 per visit, cash only  [facility] Adult Dental Access [facility]  [address], [facility] [phone] Patients are seen by appointment only. Walk-ins are not accepted. [facility] will see patients 11 years of age and older. One Wednesday Evening (Monthly: Volunteer Based).  $30 per visit, cash only  [employer]  [phone])  [phone] for adults; Children under age 20, call Graduate Pediatric Dentistry at [phone]. Children aged 85-14, please call [phone] to request a pediatric application.  Dental services are provided in all areas of dental care including fillings, crowns and bridges, complete and partial dentures, implants, gum treatment, root canals, and extractions. Preventive care is also provided. Treatment is provided to both adults and children. Patients are selected via a lottery and there is often a waiting list.   [facility] [address] [place]  [phone] [email or web address]   [facility] [address] [place], [place] [phone], Ext. [phone] Second and Fourth Thursday of each month, opens at 6:30 AM; Clinic ends at 9 AM.  Patients are seen on a first-come first-served basis, and a limited number are seen during each clinic.   [facility]  [address] Ether Griffins [place], [place] [phone]   Eligibility Requirements You must have lived in [place], [place], or [place] counties for at least the last three months.   You cannot be eligible for state or federal sponsored [place], including [employer], [place], or [employer].   You generally cannot be eligible for healthcare insurance through your employer.    How to apply: Eligibility screenings are held every Tuesday and Wednesday afternoon from 1:00 pm until 4:00 pm. You do not need an appointment for the interview!  [facility] [address],  [place], [place] [phone]   [facility] Health Department  [phone]   [facility] Health Department  [phone]   [facility] Health Department  [phone]    Behavioral Health Resources in the Community: Intensive Outpatient Programs Organization         Address  Phone  Notes  [facility] [address] [place], [place] [phone]   [facility] Outpatient [address], [place], [place] [phone]   [facility] [address] [place], [place]  [phone]   [facility] [address]  [place], [place] [phone] or [phone]   Substance Abuse Resources Organization         Address  Phone  Notes  [facility] Drug Services  [phone]   [facility]  [phone]   [facility]  [phone]   Floydene Flock  [phone]   Residential & Outpatient Substance Abuse Program  [phone]   Psychological Services Organization         Address  Phone  Notes  [facility] Behavioral Health  [phone]   [facility]  [phone]   [facility] Mental Health [address] [place] [phone] or [phone]    Mobile Crisis Teams Organization         Address  Phone  Notes  [facility], Mobile Crisis Care Unit  [phone]   [facility]  [address] [place], [place] [phone]   Doristine Locks [address] [place] [phone]    Self-Help/Support Groups Organization         Address  Phone             Notes  Mental Health Assoc. of  - variety of support groups  336- I7437963 Call for more information  Narcotics Anonymous (NA), [facility] [address], [employer] [place]  2 meetings at this location   Chief Executive Officer  Notes  [facility] [address],    [place]  [phone]   [facility]  [address], [place] [address], [place], [place] [phone]   [facility] [address] [place], [place] [phone] Admissions: 8am-3pm M-F  [facility] [address]    [place], [place] [phone]   [facility] [address] [place], [place], [place] [phone]   [facility] [address]  [place], [place] [phone]   [facility] - Intensive Outpatient [address], [place], [place] [phone]   [facility] ([facility] [address] [place]  [place], [place] [phone] or [phone]   [facility] ([facility]) [address] [place], [place] [phone] Accepts Medicaid  [facility] [address]  [place] [phone] Substance Abuse/Addiction Treatment   [facility] Organization         Address  Phone  Notes  [facility]  [phone]   Angie Fava, PhD [address] Ervin Knack [place], [place]   [phone] or [phone]   [facility] Behavioral   [address] [place], [place] [phone]   [facility] [address] [place], [place] [phone] Insurance/Medicaid/sponsorship through [facility] and Families [address]                                    [place], [place] [phone] Therapy/tele-psych/case  [facility] [address], [place] [phone]    Dr. Lolly Mustache  [phone]   [facility] [place]  [employer] [facility] Dept. 1) [address], [place] 2) [address] [place] 3)  [address], [place] [phone]  [phone]   [facility] Child Abuse Hotline 5672842876 or [phone] (After Hours)
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
## 2015-05-01 ENCOUNTER — Emergency Department (HOSPITAL_COMMUNITY)
Admission: EM | Admit: 2015-05-01 | Discharge: 2015-05-01 | Disposition: A | Discharge status: Home / Self Care | Payer: Self-pay | Attending: Emergency Medicine | Admitting: Emergency Medicine

## 2015-05-01 ENCOUNTER — Encounter (HOSPITAL_COMMUNITY): Payer: Self-pay

## 2015-05-01 DIAGNOSIS — Z8719 Personal history of other diseases of the digestive system: Secondary | ICD-10-CM | POA: Insufficient documentation

## 2015-05-01 DIAGNOSIS — Z8669 Personal history of other diseases of the nervous system and sense organs: Secondary | ICD-10-CM | POA: Insufficient documentation

## 2015-05-01 DIAGNOSIS — Z8709 Personal history of other diseases of the respiratory system: Secondary | ICD-10-CM | POA: Insufficient documentation

## 2015-05-01 DIAGNOSIS — R202 Paresthesia of skin: Secondary | ICD-10-CM | POA: Insufficient documentation

## 2015-05-01 DIAGNOSIS — Z8659 Personal history of other mental and behavioral disorders: Secondary | ICD-10-CM | POA: Insufficient documentation

## 2015-05-01 DIAGNOSIS — Z87891 Personal history of nicotine dependence: Secondary | ICD-10-CM | POA: Insufficient documentation

## 2015-05-01 DIAGNOSIS — M79642 Pain in left hand: Secondary | ICD-10-CM | POA: Insufficient documentation

## 2015-05-01 DIAGNOSIS — M25532 Pain in left wrist: Secondary | ICD-10-CM | POA: Insufficient documentation

## 2015-05-01 HISTORY — DX: Carpal tunnel syndrome, unspecified upper limb: G56.00

## 2015-05-01 MED ORDER — NAPROXEN 500 MG PO TABS: 500.0000 mg | ORAL_TABLET | Freq: Two times a day (BID) | ORAL | Status: DC

## 2015-05-01 MED ORDER — DEXAMETHASONE SODIUM PHOSPHATE 4 MG/ML IJ SOLN
4.0000 mg | Freq: Once | INTRAMUSCULAR | Status: AC
Start: 2015-05-01 — End: 2015-05-01
  Administered 2015-05-01: 4 mg via INTRAMUSCULAR
  Filled 2015-05-01: qty 1

## 2015-05-01 MED ORDER — DEXAMETHASONE SODIUM PHOSPHATE 4 MG/ML IJ SOLN: 4.0000 mg | Freq: Once | INTRAMUSCULAR | Status: DC

## 2015-05-01 MED ORDER — KETOROLAC TROMETHAMINE 60 MG/2ML IM SOLN
60.0000 mg | Freq: Once | INTRAMUSCULAR | Status: AC
  Administered 2015-05-01: 60 mg via INTRAMUSCULAR
  Filled 2015-05-01: qty 2

## 2015-05-01 NOTE — ED Notes (Signed)
Pt c/o increasing L wrist pain since 10/1.  Pain score 9/10.  Pt was seen 10/4 for same.  Pt reports that she stopped wearing the provided brace and did not follow up w/ Ortho, due to not having money or insurance.  Pt sts she took ibuprofen w/o relief.

## 2015-05-01 NOTE — Progress Notes (Signed)
Referral to [id number] sent by Cm

## 2015-05-01 NOTE — ED Provider Notes (Signed)
CSN: [id number]     Arrival date & time 05/01/15  1340 History  By signing my name below, I, Placido SouLogan Joldersma, attest that this documentation has been prepared under the direction and in the presence of Cheri FowlerKayla Sreekar Broyhill, PA-C. Electronically Signed: Placido SouLogan Joldersma, ED Scribe. 05/01/2015. 2:52 PM.   Chief Complaint  Patient presents with  . Wrist Pain   The history is provided by the patient. No language interpreter was used.    HPI Comments: Patricia Drake is a 29 y.o. female with a hx of carpel tunnel syndrome who presents to the Emergency Department complaining of worsening, moderate, left wrist pain with onset 2.5 weeks ago. Pt notes that she was seen on 10/4 for left wrist pain noting that this pain is different and more severe. She notes it is focused around the medial aspect of her left radial wrist and radiates through her left thumb. She notes worsening pain and stiffness when moving the affected wrist or opening and closing the affected hand. Pt notes a mild alleviation of her pain with pressure but denies that her provided wrist splint provides any further relief. She notes associated numbness to the end of her left thumb. She further notes taking OTC Advil which she denies provides any relief. Pt denies any new injury or hx of kidney issues. She denies tingling.   PCP: None  Past Medical History  Diagnosis Date  . Seizures (HCC)   . PTSD (post-traumatic stress disorder)   . Depression   . Anxiety   . Chronic diarrhea   . Collapsed lung   . Carpal tunnel syndrome    Past Surgical History  Procedure Laterality Date  . Cesarean section    . Tubal ligation     History reviewed. No pertinent family history. Social History  Substance Use Topics  . Smoking status: Former Smoker    Quit date: 11/11/2012  . Smokeless tobacco: Never Used  . Alcohol Use: No   OB History    No data available     Review of Systems A complete 10 system review of systems was obtained and all systems  are negative except as noted in the HPI and PMH.  Allergies  Sulfa antibiotics  Home Medications   Prior to Admission medications   Medication Sig Start Date End Date Taking? Authorizing Provider  HYDROcodone-acetaminophen (NORCO/VICODIN) 5-325 MG tablet Take 1-2 tablets by mouth every 6 hours as needed for pain and/or cough. 03/22/15   Nicole Pisciotta, PA-C   BP 136/77 mmHg  Pulse 99  Temp(Src) 98 F (36.7 C) (Oral)  Resp 20  SpO2 98%  LMP 04/24/2015 Physical Exam  Constitutional: She is oriented to person, place, and time. She appears well-developed and well-nourished.  HENT:  Head: Normocephalic and atraumatic.  Mouth/Throat: No oropharyngeal exudate.  Eyes: Conjunctivae are normal.  Neck: Normal range of motion. Neck supple. No tracheal deviation present.  Cardiovascular: Normal rate, regular rhythm and normal heart sounds.   Capillary refill less than 3 seconds.   Pulmonary/Chest: Effort normal and breath sounds normal. No respiratory distress.  Abdominal: Soft. Bowel sounds are normal. She exhibits no distension. There is no tenderness.  Musculoskeletal:       Right wrist: Normal.       Left wrist: She exhibits decreased range of motion (due to pain), tenderness (at radial styloid and radial dorsal aspect of the thumb) and swelling (minimal). She exhibits no bony tenderness, no crepitus and no deformity.       Left  hand: She exhibits decreased range of motion, tenderness and swelling (minimal). She exhibits no bony tenderness, normal capillary refill and no deformity. Decreased sensation noted. Decreased sensation is present in the radial distribution (sensation intact in all five digits, but patient reports paresthesia in distal index and thumb fingers). Decreased strength (secondary to pain) noted.  Positive finkelstein test in left wrist.  Positive tinnel sign of left wrist.   Neurological: She is alert and oriented to person, place, and time.  Skin: Skin is warm and dry.  She is not diaphoretic.  Psychiatric: She has a normal mood and affect. Her behavior is normal.  She is tearful, and crying periodically   Nursing note and vitals reviewed.  ED Course  Procedures  DIAGNOSTIC STUDIES: Oxygen Saturation is 98% on RA, normal by my interpretation.    COORDINATION OF CARE: 2:49 PM Pt presents today due to worsening left wrist pain. Discussed treatment plan with pt at bedside including 1x Toradol injection. Pt agreed to plan.  Labs Review Labs Reviewed - No data to display  Imaging Review No results found.   EKG Interpretation None     MDM   Final diagnoses:  Left wrist pain    Patient presents with worsening left hand pain.  She did not follow up with ortho because she does not have insurance or money.  VSS, NAD.  Neurovascularly intact.  Capillary refill less than 3 seconds.  Decreased ROM in left wrist secondary to pain.  Positive finkelstein test and tinnel sign of left wrist.  Suspect carpal tunnel vs de quervain's tendinitis.  Will give toradol and decadron.  Patient needs ortho follow up; however, she states she has no insurance or money.  CM consulted, and will help patient fill out charity form.  Evaluation does not show pathology requring ongoing emergent intervention or admission. Pt is hemodynamically stable and mentating appropriately. Discussed findings/results and plan with patient/guardian, who agrees with plan. All questions answered. Return precautions discussed and outpatient follow up given.    I personally performed the services described in this documentation, which was scribed in my presence. The recorded information has been reviewed and is accurate.    Cheri Fowler, PA-C 05/01/15 1551  Arby Barrette, MD 05/15/15 [phone]

## 2015-05-01 NOTE — Discharge Instructions (Signed)
De Quervain Tenosynovitis Tendons attach muscles to bones. They also help with joint movements. When tendons become irritated or swollen, it is called tendinitis. The extensor pollicis brevis (EPB) tendon connects the EPB muscle to a bone that is near the base of the thumb. The EPB muscle helps to straighten and extend the thumb. De Quervain tenosynovitis is a condition in which the EPB tendon lining (sheath) becomes irritated, thickened, and swollen. This condition is sometimes called stenosing tenosynovitis. This condition causes pain on the thumb side of the back of the wrist. CAUSES Causes of this condition include:  Activities that repeatedly cause your thumb and wrist to extend.  A sudden increase in activity or change in activity that affects your wrist. RISK FACTORS: This condition is more likely to develop in:  Females.  People who have diabetes.  Women who have recently given birth.  People who are over 40 years of age.  People who do activities that involve repeated hand and wrist motions, such as tennis, racquetball, volleyball, gardening, and taking care of children.  People who do heavy labor.  People who have poor wrist strength and flexibility.  People who do not warm up properly before activities. SYMPTOMS Symptoms of this condition include:  Pain or tenderness over the thumb side of the back of the wrist when your thumb and wrist are not moving.  Pain that gets worse when you straighten your thumb or extend your thumb or wrist.  Pain when the injured area is touched.  Locking or catching of the thumb joint while you bend and straighten your thumb.  Decreased thumb motion due to pain.  Swelling over the affected area. DIAGNOSIS This condition is diagnosed with a medical history and physical exam. Your health care provider will ask for details about your injury and ask about your symptoms. TREATMENT Treatment may include the use of icing and medicines to  reduce pain and swelling. You may also be advised to wear a splint or brace to limit your thumb and wrist motion. In less severe cases, treatment may also include working with a physical therapist to strengthen your wrist and calm the irritation around your EPB tendon sheath. In severe cases, surgery may be needed. HOME CARE INSTRUCTIONS If You Have a Splint or Brace:  Wear it as told by your health care provider. Remove it only as told by your health care provider.  Loosen the splint or brace if your fingers become numb and tingle, or if they turn cold and blue.  Keep the splint or brace clean and dry. Managing Pain, Stiffness, and Swelling   If directed, apply ice to the injured area.  Put ice in a plastic bag.  Place a towel between your skin and the bag.  Leave the ice on for 20 minutes, 2-3 times per day.  Move your fingers often to avoid stiffness and to lessen swelling.  Raise (elevate) the injured area above the level of your heart while you are sitting or lying down. General Instructions  Return to your normal activities as told by your health care provider. Ask your health care provider what activities are safe for you.  Take over-the-counter and prescription medicines only as told by your health care provider.  Keep all follow-up visits as told by your health care provider. This is important.  Do not drive or operate heavy machinery while taking prescription pain medicine. SEEK MEDICAL CARE IF:  Your pain, tenderness, or swelling gets worse, even if you have had   treatment.  You have numbness or tingling in your wrist, hand, or fingers on the injured side.   This information is not intended to replace advice given to you by your health care provider. Make sure you discuss any questions you have with your health care provider.   Document Released: 06/01/2005 Document Revised: 02/20/2015 Document Reviewed: 08/07/2014 Elsevier Interactive Patient Education 2016  Elsevier Inc[employer] Carpal Tunnel Syndrome Carpal tunnel syndrome is a condition that causes pain in your hand and arm. The carpal tunnel is a narrow area located on the palm side of your wrist. Repeated wrist motion or certain diseases may cause swelling within the tunnel. This swelling pinches the main nerve in the wrist (median nerve). CAUSES  This condition may be caused by:   Repeated wrist motions.  Wrist injuries.  Arthritis.  A cyst or tumor in the carpal tunnel.  Fluid buildup during pregnancy. Sometimes the cause of this condition is not known.  RISK FACTORS This condition is more likely to develop in:   People who have jobs that cause them to repeatedly move their wrists in the same motion, such as butchers and cashiers.  Women.  People with certain conditions, such as:  Diabetes.  Obesity.  An underactive thyroid (hypothyroidism).  Kidney failure. SYMPTOMS  Symptoms of this condition include:   A tingling feeling in your fingers, especially in your thumb, index, and middle fingers.  Tingling or numbness in your hand.  An aching feeling in your entire arm, especially when your wrist and elbow are bent for long periods of time.  Wrist pain that goes up your arm to your shoulder.  Pain that goes down into your palm or fingers.  A weak feeling in your hands. You may have trouble grabbing and holding items. Your symptoms may feel worse during the night.  DIAGNOSIS  This condition is diagnosed with a medical history and physical exam. You may also have tests, including:   An electromyogram (EMG). This test measures electrical signals sent by your nerves into the muscles.  X-rays. TREATMENT  Treatment for this condition includes:  Lifestyle changes. It is important to stop doing or modify the activity that caused your condition.  Physical or occupational therapy.  Medicines for pain and inflammation. This may include medicine that is injected into your  wrist.  A wrist splint.  Surgery. HOME CARE INSTRUCTIONS  If You Have a Splint:  Wear it as told by your health care provider. Remove it only as told by your health care provider.  Loosen the splint if your fingers become numb and tingle, or if they turn cold and blue.  Keep the splint clean and dry. General Instructions  Take over-the-counter and prescription medicines only as told by your health care provider.  Rest your wrist from any activity that may be causing your pain. If your condition is work related, talk to your employer about changes that can be made, such as getting a wrist pad to use while typing.  If directed, apply ice to the painful area:  Put ice in a plastic bag.  Place a towel between your skin and the bag.  Leave the ice on for 20 minutes, 2-3 times per day.  Keep all follow-up visits as told by your health care provider. This is important.  Do any exercises as told by your health care provider, physical therapist, or occupational therapist. SEEK MEDICAL CARE IF:   You have new symptoms.  Your pain is not controlled with medicines.    Your symptoms get worse.   This information is not intended to replace advice given to you by your health care provider. Make sure you discuss any questions you have with your health care provider.   Document Released: 05/29/2000 Document Revised: 02/20/2015 Document Reviewed: 10/17/2014 Elsevier Interactive Patient Education 2016 Elsevier Inc.  

## 2015-05-01 NOTE — Progress Notes (Signed)
ED Cm consulted by Fast tract RN, Johnny BridgeMartha, about uninsured orthopedic provider for pt  CM recommended 1) pt contact the referred orthopedic provider office's billing office for payment arrangements _ [facility] not able to guarantee available slot for pt but CM will [facility]  2) [facility] with an [facility] and [facility] be consulted by ED PA/NP/MD and pt contact [facility] along with completion of charity application

## 2015-05-26 ENCOUNTER — Encounter (HOSPITAL_COMMUNITY): Payer: Self-pay | Admitting: *Deleted

## 2015-05-26 ENCOUNTER — Emergency Department (HOSPITAL_COMMUNITY)
Admission: EM | Admit: 2015-05-26 | Discharge: 2015-05-26 | Disposition: A | Discharge status: Home / Self Care | Payer: Self-pay | Attending: Emergency Medicine | Admitting: Emergency Medicine

## 2015-05-26 DIAGNOSIS — M778 Other enthesopathies, not elsewhere classified: Secondary | ICD-10-CM

## 2015-05-26 DIAGNOSIS — Z8709 Personal history of other diseases of the respiratory system: Secondary | ICD-10-CM | POA: Insufficient documentation

## 2015-05-26 DIAGNOSIS — Z791 Long term (current) use of non-steroidal anti-inflammatories (NSAID): Secondary | ICD-10-CM | POA: Insufficient documentation

## 2015-05-26 DIAGNOSIS — Z87891 Personal history of nicotine dependence: Secondary | ICD-10-CM | POA: Insufficient documentation

## 2015-05-26 DIAGNOSIS — M779 Enthesopathy, unspecified: Secondary | ICD-10-CM | POA: Insufficient documentation

## 2015-05-26 DIAGNOSIS — Z8669 Personal history of other diseases of the nervous system and sense organs: Secondary | ICD-10-CM | POA: Insufficient documentation

## 2015-05-26 DIAGNOSIS — Z8659 Personal history of other mental and behavioral disorders: Secondary | ICD-10-CM | POA: Insufficient documentation

## 2015-05-26 MED ORDER — HYDROCODONE-ACETAMINOPHEN 5-325 MG PO TABS
2.0000 | ORAL_TABLET | Freq: Once | ORAL | Status: AC
  Administered 2015-05-26: 2 via ORAL
  Filled 2015-05-26: qty 2

## 2015-05-26 MED ORDER — PREDNISONE 20 MG PO TABS: ORAL_TABLET | ORAL | Status: DC

## 2015-05-26 MED ORDER — ONDANSETRON 4 MG PO TBDP
4.0000 mg | ORAL_TABLET | Freq: Once | ORAL | Status: AC
  Administered 2015-05-26: 4 mg via ORAL
  Filled 2015-05-26: qty 1

## 2015-05-26 MED ORDER — OXYCODONE-ACETAMINOPHEN 5-325 MG PO TABS: 1.0000 | ORAL_TABLET | ORAL | Status: DC | PRN

## 2015-05-26 MED ORDER — MELOXICAM 15 MG PO TABS: 15.0000 mg | ORAL_TABLET | Freq: Every day | ORAL | Status: DC

## 2015-05-26 NOTE — ED Notes (Signed)
Ortho tech called for splint.

## 2015-05-26 NOTE — Progress Notes (Signed)
Orthopedic Tech Progress Note Patient Details:  Patricia Drake [birth year] [id number]  Ortho Devices Type of Ortho Device: Thumb velcro splint Ortho Device/Splint Location: lue Ortho Device/Splint Interventions: Application   Patricia Drake 05/26/2015, 2:17 PM

## 2015-05-26 NOTE — Discharge Instructions (Signed)
Tendinitis and Tenosynovitis  °Tendinitis is inflammation of the tendon. Tenosynovitis is inflammation of the lining around the tendon (tendon sheath). These painful conditions often occur at once. Tendons attach muscle to bone. To move a limb, force from the muscle moves through the tendon, to the bone. These conditions often cause increased pain when moving. Tendinitis may be caused by a small or partial tear in the tendon.  °SYMPTOMS  °· Pain, tenderness, redness, bruising, or swelling at the injury. °· Loss of normal joint movement. °· Pain that gets worse with use of the muscle and joint attached to the tendon. °· Weakness in the tendon, caused by calcium build up that may occur with tendinitis. °· Commonly affected tendons: °¨ Achilles tendon (calf of leg). °¨ Rotator cuff (shoulder joint). °¨ Patellar tendon (kneecap to shin). °¨ Peroneal tendon (ankle). °¨ Posterior tibial tendon (inner ankle). °¨ Biceps tendon (in front of shoulder). °CAUSES  °· Sudden strain on a flexed muscle, muscle overuse, sudden increase or change in activity, vigorous activity. °· Result of a direct hit (less common). °· Poor muscle action (biomechanics). °RISK INCREASES WITH: °· Injury (trauma). °· Too much exercise. °· Sudden change in athletic activity. °· Incorrect exercise form or technique. °· Poor strength and flexibility. °· Not warming-up properly before activity. °· Returning to activity before healing is complete. °PREVENTION  °· Warm-up and stretch properly before activity. °· Maintain physical fitness: °¨ Joint flexibility. °¨ Muscle strength and endurance. °¨ Fitness that increases heart rate. °· Learn and use proper exercise techniques. °· Use rehabilitation exercises to strengthen weak muscles and tendons. °· Ice the tendon after activity, to reduce recurring inflammation. °· Wear proper fitting protective equipment for specific tendons, when indicated. °PROGNOSIS  °When treated properly, can be cured in 6 to 8 weeks.  Recovery may take longer, depending on degree of injury.  °RELATED COMPLICATIONS  °· Re-injury or recurring symptoms. °· Permanent weakness or joint stiffness, if injury is severe and recovery is not completed. °· Delayed healing, if sports are started before healing is complete. °· Tearing apart (rupture) of the inflamed tendon. Tendinitis means the tendon is injured and must recover. °TREATMENT  °Treatment first involves ice, medicine, and rest from aggravating activities. This reduces pain and inflammation. Modifying your activity may be considered to prevent recurring injury. A brace, elastic bandage wrap, splint, cast, or sling may be prescribed to protect the joint for a short period. After that period, strengthening and stretching exercise may help to regain strength and full range of motion. If the condition persists, despite non-surgical treatment, surgery may be recommended to remove the inflamed tendon lining. Corticosteroid injections may be given to reduce inflammation. However, these injections may weaken the tendon and increase your risk for tendon rupture. °MEDICATION  °· If pain medicine is needed, nonsteroidal anti-inflammatory medicines (aspirin and ibuprofen), or other minor pain relievers (acetaminophen), are often recommended. °· Do not take pain medicine for 7 days before surgery. °· Prescription pain relievers are usually prescribed only after surgery. Use only as directed and only as much as you need. °· Ointments applied to the skin may be helpful. °· Corticosteroid injections may be given to reduce inflammation. However, this may increase your risk of a tendon rupture. °HEAT AND COLD °· Cold treatment (icing) relieves pain and reduces inflammation. Cold treatment should be applied for 10 to 15 minutes every 2 to 3 hours, and immediately after activity that aggravates your symptoms. Use ice packs or an ice massage. °· Heat   treatment may be used before performing stretching and strengthening  activities prescribed by your caregiver, physical therapist, or athletic trainer. Use a heat pack or a warm water soak. °SEEK MEDICAL CARE IF:  °· Symptoms get worse or do not improve, despite treatment. °· Pain becomes too much to tolerate. °· You develop numbness or tingling. °· Toes become cold, or toenails become blue, gray, or dark colored. °· New, unexplained symptoms develop. (Drugs used in treatment may produce side effects.) °  °This information is not intended to replace advice given to you by your health care provider. Make sure you discuss any questions you have with your health care provider. °  °Document Released: 06/01/2005 Document Revised: 08/24/2011 Document Reviewed: 09/13/2008 °Elsevier Interactive Patient Education ©2016 [employer] ° °

## 2015-05-26 NOTE — ED Notes (Signed)
Declined W/C at D/C and was escorted to lobby by RN. 

## 2015-05-26 NOTE — ED Provider Notes (Signed)
CSN: [id number]     Arrival date & time 05/26/15  1149 History   First MD Initiated Contact with Patient 05/26/15 1206     Chief Complaint  Patient presents with  . Hand Pain     (Consider location/radiation/quality/duration/timing/severity/associated sxs/prior Treatment) HPI  Patricia Drake is a(n) 29 y.o. female who presents to the ED with cc of left thumb pain. She has ben seen here previously and diagnosed with carpal tunnel. She has been using naproxen without relief. Denies paresthesia. She has c/o pain with flexion and extension of the left thumb. She is unable to use the hand for completion of ADLs. She has not been able to follow up this Hand because of money. She denies signs or sxs  of infection. Past Medical History  Diagnosis Date  . Seizures (HCC)   . PTSD (post-traumatic stress disorder)   . Depression   . Anxiety   . Chronic diarrhea   . Collapsed lung   . Carpal tunnel syndrome    Past Surgical History  Procedure Laterality Date  . Cesarean section    . Tubal ligation     History reviewed. No pertinent family history. Social History  Substance Use Topics  . Smoking status: Former Smoker    Quit date: 11/11/2012  . Smokeless tobacco: Never Used  . Alcohol Use: No   OB History    No data available     Review of Systems   Ten systems reviewed and are negative for acute change, except as noted in the HPI.   Allergies  Sulfa antibiotics  Home Medications   Prior to Admission medications   Medication Sig Start Date End Date Taking? Authorizing Provider  HYDROcodone-acetaminophen (NORCO/VICODIN) 5-325 MG tablet Take 1-2 tablets by mouth every 6 hours as needed for pain and/or cough. 03/22/15   Nicole Pisciotta, PA-C  naproxen (NAPROSYN) 500 MG tablet Take 1 tablet (500 mg total) by mouth 2 (two) times daily. 05/01/15   Kayla Rose, PA-C   BP 114/78 mmHg  Pulse 89  Temp(Src) 99 F (37.2 C) (Oral)  Resp 18  SpO2 100%  LMP 04/22/2015 Physical Exam   Constitutional: She is oriented to person, place, and time. She appears well-developed and well-nourished. No distress.  HENT:  Head: Normocephalic and atraumatic.  Eyes: Conjunctivae are normal. No scleral icterus.  Neck: Normal range of motion.  Cardiovascular: Normal rate, regular rhythm and normal heart sounds.  Exam reveals no gallop and no friction rub.   No murmur heard. Pulmonary/Chest: Effort normal and breath sounds normal. No respiratory distress.  Abdominal: Soft. Bowel sounds are normal. She exhibits no distension and no mass. There is no tenderness. There is no guarding.  Musculoskeletal:  Exquisite TTP alon the flexor and extensor tendons of the left thumb. No crepitus, or redness. Mild warmth. No sgns of infection. Patient unwilling to participate in movement and strenght exam due to pain.  Neurological: She is alert and oriented to person, place, and time.  Skin: Skin is warm and dry. She is not diaphoretic.    ED Course  Procedures (including critical care time) Labs Review Labs Reviewed - No data to display  Imaging Review No results found. I have personally reviewed and evaluated these images and lab results as part of my medical decision-making.   EKG Interpretation None      MDM   Final diagnoses:  Thumb tendonitis    patient placed in thumb spica.  i suspect inflammatory tenosynovitis. Doubt infective process. Patient  placed in thumb spica. Ice 4-5 times a day. Prednisone taper. RICE, mobic, pain meds and hand follow up.    Arthor Captainbigail Vashawn Ekstein, PA-C 05/26/15 1556  Cathren LaineKevin Steinl, MD 05/26/15 [phone]

## 2015-05-26 NOTE — ED Notes (Signed)
Pt reports severe pain to left hand and wrist. [facility].

## 2015-06-13 ENCOUNTER — Encounter (HOSPITAL_COMMUNITY): Payer: Self-pay | Admitting: *Deleted

## 2015-06-13 ENCOUNTER — Emergency Department (HOSPITAL_COMMUNITY)
Admission: EM | Admit: 2015-06-13 | Discharge: 2015-06-14 | Disposition: A | Discharge status: Home / Self Care | Payer: Self-pay | Attending: Physician Assistant | Admitting: Physician Assistant

## 2015-06-13 DIAGNOSIS — R110 Nausea: Secondary | ICD-10-CM | POA: Insufficient documentation

## 2015-06-13 DIAGNOSIS — Z87891 Personal history of nicotine dependence: Secondary | ICD-10-CM | POA: Insufficient documentation

## 2015-06-13 DIAGNOSIS — F419 Anxiety disorder, unspecified: Secondary | ICD-10-CM | POA: Insufficient documentation

## 2015-06-13 DIAGNOSIS — Z8719 Personal history of other diseases of the digestive system: Secondary | ICD-10-CM | POA: Insufficient documentation

## 2015-06-13 DIAGNOSIS — Z8709 Personal history of other diseases of the respiratory system: Secondary | ICD-10-CM | POA: Insufficient documentation

## 2015-06-13 DIAGNOSIS — Z3202 Encounter for pregnancy test, result negative: Secondary | ICD-10-CM | POA: Insufficient documentation

## 2015-06-13 DIAGNOSIS — G44209 Tension-type headache, unspecified, not intractable: Secondary | ICD-10-CM | POA: Insufficient documentation

## 2015-06-13 DIAGNOSIS — G40909 Epilepsy, unspecified, not intractable, without status epilepticus: Secondary | ICD-10-CM | POA: Insufficient documentation

## 2015-06-13 DIAGNOSIS — Z8669 Personal history of other diseases of the nervous system and sense organs: Secondary | ICD-10-CM

## 2015-06-13 LAB — I-STAT BETA HCG BLOOD, ED (MC, WL, AP ONLY): I-stat hCG, quantitative: <5 m[IU]/mL (ref <5)

## 2015-06-13 LAB — PHENYTOIN LEVEL, TOTAL

## 2015-06-13 MED ORDER — OXYCODONE-ACETAMINOPHEN 5-325 MG PO TABS
ORAL_TABLET | ORAL | Status: AC
  Filled 2015-06-13: qty 1

## 2015-06-13 MED ORDER — OXYCODONE-ACETAMINOPHEN 5-325 MG PO TABS
1.0000 | ORAL_TABLET | Freq: Once | ORAL | Status: AC
  Administered 2015-06-13: 1 via ORAL

## 2015-06-13 MED ORDER — SODIUM CHLORIDE 0.9 % IV BOLUS (SEPSIS)
1000.0000 mL | Freq: Once | INTRAVENOUS | Status: AC
  Administered 2015-06-13: 1000 mL via INTRAVENOUS

## 2015-06-13 MED ORDER — KETOROLAC TROMETHAMINE 30 MG/ML IJ SOLN
30.0000 mg | Freq: Once | INTRAMUSCULAR | Status: AC
  Administered 2015-06-13: 30 mg via INTRAVENOUS
  Filled 2015-06-13: qty 1

## 2015-06-13 MED ORDER — METOCLOPRAMIDE HCL 5 MG/ML IJ SOLN
10.0000 mg | INTRAMUSCULAR | Status: AC
  Administered 2015-06-13: 10 mg via INTRAVENOUS
  Filled 2015-06-13: qty 2

## 2015-06-13 MED ORDER — PHENYTOIN SODIUM EXTENDED 100 MG PO CAPS
400.0000 mg | ORAL_CAPSULE | Freq: Once | ORAL | Status: AC
  Administered 2015-06-14: 400 mg via ORAL
  Filled 2015-06-13: qty 4

## 2015-06-13 MED ORDER — DEXTROSE 5 % IV SOLN
500.0000 mg | Freq: Once | INTRAVENOUS | Status: AC
  Administered 2015-06-14: 500 mg via INTRAVENOUS
  Filled 2015-06-13: qty 5

## 2015-06-13 NOTE — ED Notes (Signed)
Pt reports "I had two seizures while waiting in the lobby."

## 2015-06-13 NOTE — ED Provider Notes (Signed)
CSN: [id number]     Arrival date & time 06/13/15  1614 History   First MD Initiated Contact with Patient 06/13/15 2228     Chief Complaint  Patient presents with  . Seizures  . Headache     (Consider location/radiation/quality/duration/timing/severity/associated sxs/prior Treatment) HPI Comments: 29 year old female with a history of seizures, PTSD, depression, and anxiety presents to the emergency department for evaluation of seizure-like activity. Patient states that she had a seizure at 4 AM this morning. Her last seizure prior to this AM was "2 months ago". She states that she knew she had a seizure because she "woke up on the floor". She reported a similar story back in January 2015. Patient states that she has been noncompliant with her seizure medications over the past year. She has not been seen by a neurologist or primary care doctor in [place], since moving to the area from [place]. She is tearful and complaining that no one will help her. She states that she works Monday through Friday and has not been able to see a doctor for this reason. She characterizes her seizures as "spacing out". Patient reports that her seizures are usually "because of stress". She reports feeling increased occupational stress lately. She further complains of a constant headache over the past week, present in her bilateral temples, and unrelieved with ibuprofen. She has had some associated nausea with this. No associated fever or extremity numbness/tingling. No vision loss or hearing loss. Patient denies any tongue biting or incontinence associated with her seizure-like activity.  Patient is a 29 y.o. female presenting with seizures and headaches. The history is provided by the patient. No language interpreter was used.  Seizures Headache Associated symptoms: nausea and seizures   Associated symptoms: no fever, no neck stiffness, no numbness, no photophobia and no vomiting     Past Medical History   Diagnosis Date  . Seizures (HCC)   . PTSD (post-traumatic stress disorder)   . Depression   . Anxiety   . Chronic diarrhea   . Collapsed lung   . Carpal tunnel syndrome    Past Surgical History  Procedure Laterality Date  . Cesarean section    . Tubal ligation     History reviewed. No pertinent family history. Social History  Substance Use Topics  . Smoking status: Former Smoker    Quit date: 11/11/2012  . Smokeless tobacco: Never Used  . Alcohol Use: No   OB History    No data available      Review of Systems  Constitutional: Negative for fever.  Eyes: Negative for photophobia.  Gastrointestinal: Positive for nausea. Negative for vomiting.  Musculoskeletal: Negative for neck stiffness.  Neurological: Positive for seizures and headaches. Negative for numbness.  All other systems reviewed and are negative.   Allergies  Sulfa antibiotics  Home Medications   Prior to Admission medications   Medication Sig Start Date End Date Taking? Authorizing Provider  divalproex (DEPAKOTE) 250 MG DR tablet Take 1 tablet (250 mg total) by mouth 2 (two) times daily. 06/14/15   Antony Madura, PA-C  HYDROcodone-acetaminophen (NORCO/VICODIN) 5-325 MG tablet Take 1-2 tablets by mouth every 6 hours as needed for pain and/or cough. Patient not taking: Reported on 06/13/2015 03/22/15   Joni Reining Pisciotta, PA-C  meloxicam (MOBIC) 15 MG tablet Take 1 tablet (15 mg total) by mouth daily. Take 1 daily with food. Patient not taking: Reported on 06/13/2015 05/26/15   Arthor Captain, PA-C  oxyCODONE-acetaminophen (PERCOCET) 5-325 MG tablet Take 1-2 tablets by  mouth every 4 (four) hours as needed. Patient not taking: Reported on 06/13/2015 05/26/15   Arthor CaptainAbigail Harris, PA-C  phenytoin (DILANTIN) 100 MG ER capsule Take 4 capsules (400 mg total) by mouth at bedtime. 06/14/15   Antony MaduraKelly Dorothy Polhemus, PA-C  predniSONE (DELTASONE) 20 MG tablet 3 tabs po daily x 3 days, then 2 tabs x 3 days, then 1.5 tabs x 3 days, then  1 tab x 3 days, then 0.5 tabs x 3 days Patient not taking: Reported on 06/13/2015 05/26/15   Arthor CaptainAbigail Harris, PA-C   BP 111/92 mmHg  Pulse 74  Temp(Src) 98.3 F (36.8 C) (Oral)  Resp 26  SpO2 99%  LMP 05/16/2015   Physical Exam  Constitutional: She is oriented to person, place, and time. She appears well-developed and well-nourished. No distress.  Nontoxic/nonseptic appearing. Patient tearful.  HENT:  Head: Normocephalic and atraumatic.  No battle's sign, raccoon's eyes, hematoma, or contusion noted.  Eyes: Conjunctivae and EOM are normal. No scleral icterus.  Neck: Normal range of motion.  No nuchal rigidity or meningismus  Cardiovascular: Normal rate, regular rhythm and intact distal pulses.   Pulmonary/Chest: Effort normal. No respiratory distress. She has no wheezes.  Respirations even and unlabored.   Musculoskeletal: Normal range of motion.  Neurological: She is alert and oriented to person, place, and time. No cranial nerve deficit. She exhibits normal muscle tone. Coordination normal.  GCS 15. Speech is goal oriented. No cranial nerve deficits appreciated; symmetric eyebrow raise, no facial drooping, tongue midline. Patient with 5/5 strength against resistance in all major muscle groups bilaterally. Sensation to light touch intact. Patient moves extremities without ataxia.  Skin: Skin is warm and dry. No rash noted. She is not diaphoretic. No erythema. No pallor.  Psychiatric: She has a normal mood and affect. Her behavior is normal.  Nursing note and vitals reviewed.   ED Course  Procedures (including critical care time) Labs Review Labs Reviewed  PHENYTOIN LEVEL, TOTAL - Abnormal; Notable for the following:    Phenytoin Lvl <2.5 (*)    All other components within normal limits  CBG MONITORING, ED  I-STAT BETA HCG BLOOD, ED ([facility], [facility], [facility] ONLY)    Imaging Review No results found.   12:09 AM CT scans from 06/2013 and 12/2013, both negative for acute changes.  12:48  AM Patient states headache has greatly improved since arrival. She has no c/o at present. Anticipate d/c after patient loaded with Dilantin and Depakote.  I have personally reviewed and evaluated these images and lab results as part of my medical decision-making.   EKG Interpretation None       Medications  oxyCODONE-acetaminophen (PERCOCET/ROXICET) 5-325 MG per tablet 1 tablet (1 tablet Oral Given 06/13/15 1729)  ketorolac (TORADOL) 30 MG/ML injection 30 mg (30 mg Intravenous Given 06/13/15 2343)  metoCLOPramide (REGLAN) injection 10 mg (10 mg Intravenous Given 06/13/15 2343)  sodium chloride 0.9 % bolus 1,000 mL (0 mLs Intravenous Stopped 06/14/15 0059)  valproate (DEPACON) 500 mg in dextrose 5 % 50 mL IVPB (0 mg Intravenous Stopped 06/14/15 0214)  phenytoin (DILANTIN) ER capsule 400 mg (400 mg Oral Given 06/14/15 0059)    MDM   Final diagnoses:  History of seizure disorder  Tension headache    29 year old female presents to the emergency department for evaluation of headache. This is consistent with a tension-type headache. Neurologic exam nonfocal. CT scans over the past year reviewed, all negative for acute findings. Doubt acute intracranial processes cause of symptoms today. No fever, nuchal  rigidity, or meningismus to suggest meningitis.  Patient reporting that she had a seizure this morning. She has had no witnessed seizure activity while in the emergency department. She has been noncompliant with her seizure medications for at least 1 year. On review of prior charts from visits to the ED, she was found to be on Dilantin and Depakote in the past. Patient given these medications and will discharge with prescriptions for both. She has been referred to neurology for follow-up as she has not seen a neurologist since moving to [place] from [place] in 2015.  No indication for further emergent workup at this time. Patient given return precautions. Patient discharged in  satisfactory condition with no unaddressed concerns   Filed Vitals:   06/13/15 2330 06/13/15 2345 06/14/15 0000 06/14/15 0015  BP: 101/71 105/38 101/67 111/92  Pulse: 67 78 67 74  Temp:      TempSrc:      Resp: SpO2: 98% 97% 100% 99%     Antony Madura, PA-C 06/16/15 1917  Courteney Randall An, MD 06/18/15 5621

## 2015-06-13 NOTE — ED Notes (Addendum)
Pt reports unable to get seizure meds x 2 months and now having seizures, last one was this afternoon. Pt also reports headache, fatigue, weakness, difficulty ambulating.

## 2015-06-13 NOTE — ED Notes (Signed)
Pt overheard in lobby stating "Maybee I should have a seizure so I can get back to a room."

## 2015-06-14 LAB — CBG MONITORING, ED: Glucose-Capillary: 72 mg/dL (ref 65–99)

## 2015-06-14 MED ORDER — PHENYTOIN SODIUM EXTENDED 100 MG PO CAPS: 400.0000 mg | ORAL_CAPSULE | Freq: Every day | ORAL | Status: DC

## 2015-06-14 MED ORDER — DIVALPROEX SODIUM 250 MG PO DR TAB: 250.0000 mg | DELAYED_RELEASE_TABLET | Freq: Two times a day (BID) | ORAL | Status: DC

## 2015-06-14 NOTE — Discharge Instructions (Signed)
Follow-up with a neurologist for further evaluation of your seizure disorder. Follow-up with a primary care doctor for recheck of symptoms and management of your daily medications. Return to the emergency department as needed if symptoms worsen.  Tension Headache A tension headache is a feeling of pain, pressure, or aching that is often felt over the front and sides of the head. The pain can be dull, or it can feel tight (constricting). Tension headaches are not normally associated with nausea or vomiting, and they do not get worse with physical activity. Tension headaches can last from 30 minutes to several days. This is the most common type of headache. CAUSES The exact cause of this condition is not known. Tension headaches often begin after stress, anxiety, or depression. Other triggers may include:  Alcohol.  Too much caffeine, or caffeine withdrawal.  Respiratory infections, such as colds, flu, or sinus infections.  Dental problems or teeth clenching.  Fatigue.  Holding your head and neck in the same position for a long period of time, such as while using a computer.  Smoking. SYMPTOMS Symptoms of this condition include:  A feeling of pressure around the head.  Dull, aching head pain.  Pain felt over the front and sides of the head.  Tenderness in the muscles of the head, neck, and shoulders. DIAGNOSIS This condition may be diagnosed based on your symptoms and a physical exam. Tests may be done, such as a CT scan or an MRI of your head. These tests may be done if your symptoms are severe or unusual. TREATMENT This condition may be treated with lifestyle changes and medicines to help relieve symptoms. HOME CARE INSTRUCTIONS Managing Pain  Take over-the-counter and prescription medicines only as told by your health care provider.  Lie down in a dark, quiet room when you have a headache.  If directed, apply ice to the head and neck area:  Put ice in a plastic  bag.  Place a towel between your skin and the bag.  Leave the ice on for 20 minutes, 2-3 times per day.  Use a heating pad or a hot shower to apply heat to the head and neck area as told by your health care provider. Eating and Drinking  Eat meals on a regular schedule.  Limit alcohol use.  Decrease your caffeine intake, or stop using caffeine. General Instructions  Keep all follow-up visits as told by your health care provider. This is important.  Keep a headache journal to help find out what may trigger your headaches. For example, write down:  What you eat and drink.  How much sleep you get.  Any change to your diet or medicines.  Try massage or other relaxation techniques.  Limit stress.  Sit up straight, and avoid tensing your muscles.  Do not use tobacco products, including cigarettes, chewing tobacco, or e-cigarettes. If you need help quitting, ask your health care provider.  Exercise regularly as told by your health care provider.  Get 7-9 hours of sleep, or the amount recommended by your health care provider. SEEK MEDICAL CARE IF:  Your symptoms are not helped by medicine.  You have a headache that is different from what you normally experience.  You have nausea or you vomit.  You have a fever. SEEK IMMEDIATE MEDICAL CARE IF:  Your headache becomes severe.  You have repeated vomiting.  You have a stiff neck.  You have a loss of vision.  You have problems with speech.  You have pain in  your eye or ear.  You have muscular weakness or loss of muscle control.  You lose your balance or you have trouble walking.  You feel faint or you pass out.  You have confusion.   This information is not intended to replace advice given to you by your health care provider. Make sure you discuss any questions you have with your health care provider.   Document Released: 06/01/2005 Document Revised: 02/20/2015 Document Reviewed: 09/24/2014 Elsevier  Interactive Patient Education [employer] Seizure, Adult A seizure is abnormal electrical activity in the brain. Seizures usually last from 30 seconds to 2 minutes. There are various types of seizures. Before a seizure, you may have a warning sensation (aura) that a seizure is about to occur. An aura may include the following symptoms:   Fear or anxiety.  Nausea.  Feeling like the room is spinning (vertigo).  Vision changes, such as seeing flashing lights or spots. Common symptoms during a seizure include:  A change in attention or behavior (altered mental status).  Convulsions with rhythmic jerking movements.  Drooling.  Rapid eye movements.  Grunting.  Loss of bladder and bowel control.  Bitter taste in the mouth.  Tongue biting. After a seizure, you may feel confused and sleepy. You may also have an injury resulting from convulsions during the seizure. HOME CARE INSTRUCTIONS   If you are given medicines, take them exactly as prescribed by your health care provider.  Keep all follow-up appointments as directed by your health care provider.  Do not swim or drive or engage in risky activity during which a seizure could cause further injury to you or others until your health care provider says it is OK.  Get adequate rest.  Teach friends and family what to do if you have a seizure. They should:  Lay you on the ground to prevent a fall.  Put a cushion under your head.  Loosen any tight clothing around your neck.  Turn you on your side. If vomiting occurs, this helps keep your airway clear.  Stay with you until you recover.  Know whether or not you need emergency care. SEEK IMMEDIATE MEDICAL CARE IF:  The seizure lasts longer than 5 minutes.  The seizure is severe or you do not wake up immediately after the seizure.  You have an altered mental status after the seizure.  You are having more frequent or worsening seizures. Someone should drive you to  the emergency department or call local emergency services (911 in [place] MAKE SURE YOU:  Understand these instructions.  Will watch your condition.  Will get help right away if you are not doing well or get worse.   This information is not intended to replace advice given to you by your health care provider. Make sure you discuss any questions you have with your health care provider.   Document Released: 05/29/2000 Document Revised: 06/22/2014 Document Reviewed: 01/11/2013 Elsevier Interactive Patient Education 2016 [employer].  Emergency Department Resource Guide 1) Find a Doctor and Pay Out of Pocket Although you won't have to find out who is covered by your insurance plan, it is a good idea to ask around and get recommendations. You will then need to call the office and see if the doctor you have chosen will accept you as a new patient and what types of options they offer for patients who are self-pay. Some doctors offer discounts or will set up payment plans for their patients who do not have insurance, but  you will need to ask so you aren't surprised when you get to your appointment.  2) Contact Your Local Health Department Not all health departments have doctors that can see patients for sick visits, but many do, so it is worth a call to see if yours does. If you don't know where your local health department is, you can check in your phone book. The CDC also has a tool to help you locate your state's health department, and many state websites also have listings of all of their local health departments.  3) Find a Walk-in Clinic If your illness is not likely to be very severe or complicated, you may want to try a walk in clinic. These are popping up all over the country in pharmacies, drugstores, and shopping centers. They're usually staffed by nurse practitioners or physician assistants that have been trained to treat common illnesses and complaints. They're usually fairly quick and  inexpensive. However, if you have serious medical issues or chronic medical problems, these are probably not your best option.  No Primary Care Doctor: - Call Health Connect at  [phone] - they can help you locate a primary care doctor that  accepts your insurance, provides certain services, etc. - Physician Referral Service- [phone]  Chronic Pain Problems: Organization         Address  Phone   Notes  [facility]  [phone] Patients need to be referred by their primary care doctor.   Medication Assistance: Organization         Address  Phone   Notes  [facility] Medication [facility] [address] [place], [place] [address] [phone] --Must be a resident of [facility] -- Must have NO insurance coverage whatsoever (no Medicaid/ Medicare, etc.) -- The pt. MUST have a primary care doctor that directs their care regularly and follows them in the community   [facility]ist  [phone]   [employer]  [phone]    Agencies that provide inexpensive medical care: Organization         Address  Phone   Notes  [facility]  [phone]   [facility]    [phone]   [facility] [address] [place], [place] [address] [phone]   [facility] [address] [place], [place] [phone]   Planned Parenthood    [phone]   [facility]    [phone]   [facility] and [facility]  [address], [place] Phone:  [phone], Fax:  [phone] Hours of Operation:  9 am - 6 pm, M-F.  Also accepts Medicaid/Medicare and self-pay.  [facility]  [address], [place] Phone: [phone], Fax: [phone]. Hours of Operation:  8:30 am - 5:30 pm, M-F.  Also accepts Medicaid and self-pay.  [facility] [place] [address] [place] Phone: [phone]   [facility] [address] Natasha Bence [place], [place] [phone], Ext. [phone] Mondays & Thursdays: 7-9 AM.  First 15 patients are seen on a first come, first serve basis.    Medicaid-accepting [facility] Providers:  Organization         Address  Phone   Notes  [facility] [address],  [phone] Also accepts self-pay patients.  [facility] [address] [place], [place]  [address] [place]  [phone]   [facility] [place] [facility] [address], [place] [phone]   [facility] [address] [place] [phone]   Renaye Rakers [address], [place]   [phone] Only accepts [place] Access [place] patients after they have their name applied to their card.   Self-Pay (no insurance) in [facility]:  Organization         Address  Phone   Notes  Sickle Cell Patients, [facility] [address] [place], [place] [phone]   [facility] [address] [place], [place] [phone]   [facility]  [address], [place] [phone]   [facility]/Dr. Osei-Bonsu  [address], [place] or [address], [place] [phone] Phone number for both [place] and [place] locations is the same.  [facility] [address] [place] [phone]   [facility] [address], [place] or [address] [phone]   [facility] [address], [place] [phone], phone; [phone], fax Sees patients 1st and 3rd Saturday of every month.  Must not qualify for public or private insurance (i.e. Medicaid, Medicare, [place] Health Choice, Veterans' Benefits)  Household income should be no more than 200% of the poverty level The clinic cannot treat you if you are pregnant or  think you are pregnant  Sexually transmitted diseases are not treated at the clinic.    Dental Care: Organization         Address  Phone  Notes  [facility] Department of [facility] [address] [place], [place] [phone] Accepts children up to age 23 who are enrolled in [place] or [place] Health Choice; pregnant women with a Medicaid card; and children who have applied for Medicaid or [place] Health Choice, but were declined, whose parents can pay a reduced fee at time of service.  [facility] Department of [facility]  [address], [place] [phone] Accepts children up to age 40 who are enrolled in [place] or [place] Health Choice; pregnant women with a Medicaid card; and children who have applied for Medicaid or [place] Health Choice, but were declined, whose parents can pay a reduced fee at time of service.  [facility]  [address] [place], [place] [phone] Patients are seen by appointment only. Walk-ins are not accepted. [facility] will see patients 9 years of age and older. Monday - Tuesday (8am-5pm) Most Wednesdays (8:30-5pm) $30 per visit, cash only  [facility]  [address], [facility] [phone] Patients are seen by appointment only. Walk-ins are not accepted. [facility] will see patients 108 years of age and older. One Wednesday Evening (Monthly: Volunteer Based).  $30 per visit, cash only  [employer]  [phone] for adults; Children under age 6, call Graduate Pediatric Dentistry at [phone]. Children aged 34-14, please call [phone] to request a pediatric application.  Dental services are provided in all areas of dental care including fillings, crowns and bridges, complete and partial dentures, implants, gum treatment, root canals, and extractions. Preventive care is also provided. Treatment is provided to both adults  and children. Patients are selected via a lottery  and there is often a waiting list.   [facility] [address] [place]  [phone] [email or web address]   [facility] [address] [place], [place] [phone], Ext. [phone] Second and Fourth Thursday of each month, opens at 6:30 AM; Clinic ends at 9 AM.  Patients are seen on a first-come first-served basis, and a limited number are seen during each clinic.   [facility]  [address] [place] [place], [place] [phone]   Eligibility Requirements You must have lived in [place], [place], or [place] counties for at least the last three months.   You cannot be eligible for state or federal sponsored [place], including [employer], [place], or [employer].   You generally cannot be eligible for healthcare insurance through your employer.    How to apply: Eligibility screenings are held every Tuesday and Wednesday afternoon from 1:00 pm until 4:00 pm. You do not need an appointment for the interview!  [facility] [address], [place], [place] [phone]   [facility] Health Department  [phone]   [facility] Health Department  [phone]   [facility] Health Department  [phone]    Behavioral Health Resources in the Community: Intensive Outpatient Programs Organization         Address  Phone  Notes  [facility] [address] [place], [place] [phone]   [facility] Outpatient [address], [place], [place] [phone]   [facility] [address] [place], [place]  [phone]   [facility] [address]  [place], [place] [phone] or [phone]   Substance Abuse Resources Organization         Address  Phone  Notes  [facility] Drug [facility]  [phone]   [facility]  [phone]   [facility] [place]  [phone]    Floydene Flock  [phone]   Residential & Outpatient Substance Abuse Program  [phone]   Psychological Services Organization         Address  Phone  Notes  [facility]  [phone]   [facility] Services  [phone]   [facility] [address], [place] [phone] or [phone]    Mobile Crisis Teams Organization         Address  Phone  Notes  [facility], Mobile Crisis Care Unit  [phone]   [facility]  [address] [place], [place] [phone]   Doristine Locks [address] [place] [phone]    Self-Help/Support Groups Organization         Address  Phone             Notes  [facility] [place] - variety of support groups  [phone]- I7437963 Call for more information  Narcotics Anonymous (NA), [facility] [address], [employer] [place]  2 meetings at this location   Statistician         Address  Phone  Notes  [facility] [address],    [place]  [phone]   [facility]  [address] [place] [address], [place], [place] [phone]   [facility] [address] [place], [place] [phone] Admissions: 8am-3pm M-F  [facility] [address],    [place], [place] [phone]   [facility] [address] [place] [address], [place], [place]  [phone]   [facility] [address]  [place], [place] [phone]   [facility] - Intensive Outpatient [address], [place], [place] [phone]   [facility] ([facility] [address]  [place], [place] [phone] or [phone]   [facility] ([facility]) [address] [place], [place] [phone] Accepts Medicaid  [facility] [address]  [place] [phone] Substance Abuse/Addiction Treatment   [facility]  Behavioral Health Resources Organization         Address  Phone  Notes  [facility]  [phone]) [phone]   Angie FavaJulie Brannon, PhD [address] [place], [place]   [phone]) [phone] or [phone]) [phone]   [facility]   [address] [place], [place] [phone]) [phone]   [facility] [address], [place], [place] [phone](336) [phone] Insurance/Medicaid/sponsorship through [facility]  [facility] [address]                                    [place], [place] [phone]) [phone] Therapy/tele-psych/case  [facility] [address]   [place], [place] [phone]    Dr. Lolly MustacheArfeen  [phone]) [phone]   [facility]  [employer] [facility] 1) [address], [place] 2) [address], [place] 3)  [address], [place] [phone]) [phone]  [phone]   [facility] Child Abuse Hotline [phone] or [phone]) [phone] (After Hours)

## 2015-06-25 ENCOUNTER — Encounter (HOSPITAL_COMMUNITY): Payer: Self-pay

## 2015-06-25 ENCOUNTER — Encounter (HOSPITAL_COMMUNITY): Payer: Self-pay | Admitting: *Deleted

## 2015-06-25 ENCOUNTER — Emergency Department (HOSPITAL_COMMUNITY)
Admission: EM | Admit: 2015-06-25 | Discharge: 2015-06-25 | Disposition: A | Discharge status: Other Acute Inpatient Hospital | Payer: Self-pay

## 2015-06-25 ENCOUNTER — Observation Stay (HOSPITAL_COMMUNITY)
Admission: AD | Admit: 2015-06-25 | Discharge: 2015-06-26 | Disposition: A | Discharge status: Home / Self Care | Payer: Federal, State, Local not specified - Other | Source: Intra-hospital | Attending: Psychiatry | Admitting: Psychiatry

## 2015-06-25 DIAGNOSIS — Z882 Allergy status to sulfonamides status: Secondary | ICD-10-CM | POA: Insufficient documentation

## 2015-06-25 DIAGNOSIS — F1210 Cannabis abuse, uncomplicated: Secondary | ICD-10-CM | POA: Insufficient documentation

## 2015-06-25 DIAGNOSIS — F329 Major depressive disorder, single episode, unspecified: Secondary | ICD-10-CM

## 2015-06-25 DIAGNOSIS — G40909 Epilepsy, unspecified, not intractable, without status epilepticus: Secondary | ICD-10-CM | POA: Insufficient documentation

## 2015-06-25 DIAGNOSIS — Z8669 Personal history of other diseases of the nervous system and sense organs: Secondary | ICD-10-CM | POA: Insufficient documentation

## 2015-06-25 DIAGNOSIS — F322 Major depressive disorder, single episode, severe without psychotic features: Secondary | ICD-10-CM | POA: Insufficient documentation

## 2015-06-25 DIAGNOSIS — Z87891 Personal history of nicotine dependence: Secondary | ICD-10-CM | POA: Insufficient documentation

## 2015-06-25 DIAGNOSIS — F332 Major depressive disorder, recurrent severe without psychotic features: Principal | ICD-10-CM | POA: Diagnosis present

## 2015-06-25 DIAGNOSIS — F4310 Post-traumatic stress disorder, unspecified: Secondary | ICD-10-CM | POA: Diagnosis present

## 2015-06-25 DIAGNOSIS — R45851 Suicidal ideations: Secondary | ICD-10-CM

## 2015-06-25 DIAGNOSIS — Z3202 Encounter for pregnancy test, result negative: Secondary | ICD-10-CM | POA: Insufficient documentation

## 2015-06-25 DIAGNOSIS — F419 Anxiety disorder, unspecified: Secondary | ICD-10-CM | POA: Insufficient documentation

## 2015-06-25 DIAGNOSIS — F32A Depression, unspecified: Secondary | ICD-10-CM

## 2015-06-25 DIAGNOSIS — G5600 Carpal tunnel syndrome, unspecified upper limb: Secondary | ICD-10-CM | POA: Insufficient documentation

## 2015-06-25 DIAGNOSIS — Z8709 Personal history of other diseases of the respiratory system: Secondary | ICD-10-CM | POA: Insufficient documentation

## 2015-06-25 LAB — RAPID URINE DRUG SCREEN, HOSP PERFORMED
Amphetamines: NOT DETECTED
Barbiturates: NOT DETECTED
Benzodiazepines: NOT DETECTED
Cocaine: NOT DETECTED
OPIATES: NOT DETECTED
TETRAHYDROCANNABINOL: POSITIVE — AB

## 2015-06-25 LAB — CBC
HEMATOCRIT: 37.8 % (ref 36.0–46.0)
HEMOGLOBIN: 12.7 g/dL (ref 12.0–15.0)
MCH: 29.8 pg (ref 26.0–34.0)
MCHC: 33.6 g/dL (ref 30.0–36.0)
MCV: 88.7 fL (ref 78.0–100.0)
Platelets: 372 10*3/uL (ref 150–400)
RBC: 4.26 MIL/uL (ref 3.87–5.11)
RDW: 12.8 % (ref 11.5–15.5)
WBC: 5.9 10*3/uL (ref 4.0–10.5)

## 2015-06-25 LAB — COMPREHENSIVE METABOLIC PANEL
ALBUMIN: 3.2 g/dL — AB (ref 3.5–5.0)
ALK PHOS: 67 U/L (ref 38–126)
ALT: 20 U/L (ref 14–54)
AST: 21 U/L (ref 15–41)
Anion gap: 9 (ref 5–15)
BUN: 10 mg/dL (ref 6–20)
CHLORIDE: 107 mmol/L (ref 101–111)
CO2: 23 mmol/L (ref 22–32)
CREATININE: 0.67 mg/dL (ref 0.44–1.00)
Calcium: 8.9 mg/dL (ref 8.9–10.3)
GFR calc non Af Amer: >60 mL/min (ref >60)
GLUCOSE: 93 mg/dL (ref 65–99)
Potassium: 3.9 mmol/L (ref 3.5–5.1)
SODIUM: 139 mmol/L (ref 135–145)
Total Bilirubin: 0.4 mg/dL (ref 0.3–1.2)
Total Protein: 7.8 g/dL (ref 6.5–8.1)

## 2015-06-25 LAB — SALICYLATE LEVEL

## 2015-06-25 LAB — POC URINE PREG, ED: Preg Test, Ur: NEGATIVE

## 2015-06-25 LAB — ETHANOL: Alcohol, Ethyl (B): <5 mg/dL (ref <5)

## 2015-06-25 LAB — ACETAMINOPHEN LEVEL

## 2015-06-25 MED ORDER — ALUM & MAG HYDROXIDE-SIMETH 200-200-20 MG/5ML PO SUSP: 30.0000 mL | ORAL | Status: DC | PRN

## 2015-06-25 MED ORDER — LORAZEPAM 1 MG PO TABS: 1.0000 mg | ORAL_TABLET | Freq: Three times a day (TID) | ORAL | Status: DC | PRN

## 2015-06-25 MED ORDER — IBUPROFEN 200 MG PO TABS: 600.0000 mg | ORAL_TABLET | Freq: Three times a day (TID) | ORAL | Status: DC | PRN

## 2015-06-25 MED ORDER — ACETAMINOPHEN 325 MG PO TABS: 650.0000 mg | ORAL_TABLET | ORAL | Status: DC | PRN

## 2015-06-25 MED ORDER — ACETAMINOPHEN 325 MG PO TABS
650.0000 mg | ORAL_TABLET | ORAL | Status: DC | PRN
  Administered 2015-06-25: 650 mg via ORAL
  Filled 2015-06-25: qty 2

## 2015-06-25 MED ORDER — IBUPROFEN 600 MG PO TABS: 600.0000 mg | ORAL_TABLET | Freq: Three times a day (TID) | ORAL | Status: DC | PRN

## 2015-06-25 MED ORDER — MAGNESIUM HYDROXIDE 400 MG/5ML PO SUSP: 30.0000 mL | Freq: Every day | ORAL | Status: DC | PRN

## 2015-06-25 NOTE — ED Notes (Signed)
Pt oriented to room and unit.  Pt is calm and cooperative.  She denies SI, HI, and AVH, however she states "I dont feel like I belong here"  She claims she has been unable to get her psyche meds for two years because of lack of insurance.  15 minute checks and video monitoring in place.

## 2015-06-25 NOTE — ED Provider Notes (Signed)
CSN: [id number]     Arrival date & time 06/25/15  0845 History   First MD Initiated Contact with Patient 06/25/15 [phone]     Chief Complaint  Patient presents with  . Suicidal     (Consider location/radiation/quality/duration/timing/severity/associated sxs/prior Treatment) The history is provided by the patient.  Patient w hx ptsd, bipolar disorder c/o worsening depression and suicidal thoughts in the past few weeks.  Pt indicates has been out of meds x 2 years.  States thinks she feels worse now due to loss of job and money stressors.  +SI.  Denies attempt to harm self or specific plan.  States physical health at baseline, no recent febrile illness. occ thc use, denies etoh or other drug use. Normal appetite. No wt loss.       Past Medical History  Diagnosis Date  . Seizures (HCC)   . PTSD (post-traumatic stress disorder)   . Depression   . Anxiety   . Chronic diarrhea   . Collapsed lung   . Carpal tunnel syndrome    Past Surgical History  Procedure Laterality Date  . Cesarean section    . Tubal ligation     History reviewed. No pertinent family history. Social History  Substance Use Topics  . Smoking status: Former Smoker    Quit date: 11/11/2012  . Smokeless tobacco: Never Used  . Alcohol Use: No   OB History    No data available     Review of Systems  Constitutional: Negative for fever and chills.  HENT: Negative for sore throat.   Eyes: Negative for redness.  Respiratory: Negative for shortness of breath.   Cardiovascular: Negative for chest pain.  Gastrointestinal: Negative for abdominal pain.  Genitourinary: Negative for flank pain.  Musculoskeletal: Negative for back pain and neck pain.  Skin: Negative for rash.  Neurological: Negative for headaches.  Hematological: Does not bruise/bleed easily.  Psychiatric/Behavioral: Positive for dysphoric mood.      Allergies  Sulfa antibiotics  Home Medications   Prior to Admission medications   Medication  Sig Start Date End Date Taking? Authorizing Provider  divalproex (DEPAKOTE) 250 MG DR tablet Take 1 tablet (250 mg total) by mouth 2 (two) times daily. Patient not taking: Reported on 06/25/2015 06/14/15   Antony MaduraKelly Humes, PA-C  HYDROcodone-acetaminophen (NORCO/VICODIN) 5-325 MG tablet Take 1-2 tablets by mouth every 6 hours as needed for pain and/or cough. Patient not taking: Reported on 06/13/2015 03/22/15   Joni ReiningNicole Pisciotta, PA-C  meloxicam (MOBIC) 15 MG tablet Take 1 tablet (15 mg total) by mouth daily. Take 1 daily with food. Patient not taking: Reported on 06/13/2015 05/26/15   Arthor CaptainAbigail Harris, PA-C  oxyCODONE-acetaminophen (PERCOCET) 5-325 MG tablet Take 1-2 tablets by mouth every 4 (four) hours as needed. Patient not taking: Reported on 06/13/2015 05/26/15   Arthor CaptainAbigail Harris, PA-C  phenytoin (DILANTIN) 100 MG ER capsule Take 4 capsules (400 mg total) by mouth at bedtime. Patient not taking: Reported on 06/25/2015 06/14/15   Antony MaduraKelly Humes, PA-C  predniSONE (DELTASONE) 20 MG tablet 3 tabs po daily x 3 days, then 2 tabs x 3 days, then 1.5 tabs x 3 days, then 1 tab x 3 days, then 0.5 tabs x 3 days Patient not taking: Reported on 06/13/2015 05/26/15   Arthor CaptainAbigail Harris, PA-C   BP 101/61 mmHg  Pulse 92  Temp(Src) 98.3 F (36.8 C) (Oral)  Resp 16  SpO2 96%  LMP 06/20/2015 (Approximate) Physical Exam  Constitutional: She is oriented to person, place, and time. She  appears well-developed and well-nourished. No distress.  HENT:  Mouth/Throat: Oropharynx is clear and moist.  Eyes: Conjunctivae are normal. Pupils are equal, round, and reactive to light. No scleral icterus.  Neck: Neck supple. No tracheal deviation present. No thyromegaly present.  Cardiovascular: Normal rate, regular rhythm, normal heart sounds and intact distal pulses.   Pulmonary/Chest: Effort normal and breath sounds normal. No respiratory distress.  Abdominal: Soft. Normal appearance and bowel sounds are normal. She exhibits no  distension. There is no tenderness.  Genitourinary:  No cva tenderness  Musculoskeletal: She exhibits no edema.  Neurological: She is alert and oriented to person, place, and time.  Steady gait  Skin: Skin is warm and dry. No rash noted. She is not diaphoretic.  Psychiatric:  Tearful, anxious. +SI.    Nursing note and vitals reviewed.   ED Course  Procedures (including critical care time) Labs Review   Results for orders placed or performed during the hospital encounter of 06/25/15  Comprehensive metabolic panel  Result Value Ref Range   Sodium 139 135 - 145 mmol/L   Potassium 3.9 3.5 - 5.1 mmol/L   Chloride 107 101 - 111 mmol/L   CO2 23 22 - 32 mmol/L   Glucose, Bld 93 65 - 99 mg/dL   BUN 10 6 - 20 mg/dL   Creatinine, Ser 1.61 0.44 - 1.00 mg/dL   Calcium 8.9 8.9 - 09.6 mg/dL   Total Protein 7.8 6.5 - 8.1 g/dL   Albumin 3.2 (L) 3.5 - 5.0 g/dL   AST 21 15 - 41 U/L   ALT 20 14 - 54 U/L   Alkaline Phosphatase 67 38 - 126 U/L   Total Bilirubin 0.4 0.3 - 1.2 mg/dL   GFR calc non Af Amer >60 >60 mL/min   GFR calc Af Amer >60 >60 mL/min   Anion gap 9 5 - 15  Ethanol (ETOH)  Result Value Ref Range   Alcohol, Ethyl (B) <5 <5 mg/dL  Salicylate level  Result Value Ref Range   Salicylate Lvl <4.0 2.8 - 30.0 mg/dL  Acetaminophen level  Result Value Ref Range   Acetaminophen (Tylenol), Serum <10 (L) 10 - 30 ug/mL  CBC  Result Value Ref Range   WBC 5.9 4.0 - 10.5 K/uL   RBC 4.26 3.87 - 5.11 MIL/uL   Hemoglobin 12.7 12.0 - 15.0 g/dL   HCT 04.5 40.9 - 81.1 %   MCV 88.7 78.0 - 100.0 fL   MCH 29.8 26.0 - 34.0 pg   MCHC 33.6 30.0 - 36.0 g/dL   RDW 91.4 78.2 - 95.6 %   Platelets 372 150 - 400 K/uL  Urine rapid drug screen (hosp performed) (Not at [facility])  Result Value Ref Range   Opiates NONE DETECTED NONE DETECTED   Cocaine NONE DETECTED NONE DETECTED   Benzodiazepines NONE DETECTED NONE DETECTED   Amphetamines NONE DETECTED NONE DETECTED   Tetrahydrocannabinol POSITIVE (A)  NONE DETECTED   Barbiturates NONE DETECTED NONE DETECTED  POC urine preg, ED (not at [facility])  Result Value Ref Range   Preg Test, Ur NEGATIVE NEGATIVE       I have personally reviewed and evaluated these images and lab results as part of my medical decision-making.    MDM   Labs sent.  Reviewed nursing notes and prior charts for additional history.   Psych holding orders placed.  Psych team consulted - disposition per psych team.     Cathren Laine, MD 06/25/15 1218

## 2015-06-25 NOTE — ED Notes (Addendum)
Pt reports SI thoughts since Friday 1/6, reports due to life stressors.Denies plan, has thoughts of "being better off if she wasn't here".  Has past SI attempt by OD. Reports she has been off any kind of depression meds for 2 years. Pt denies HI, AH/VH.  Pt wearing brace on left hand. rn explained that she may be required to take brace off. Pt became verbally hostile saying she would not take the brace off, and that she would just leave. rn explained that pt cannot leave till a psychiatrist evaluates her due to her SI thoughts.
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
## 2015-06-25 NOTE — ED Notes (Signed)
Patient discharged safely,ambulatory with [facility] driver.  All belongings were returned to pt.

## 2015-06-25 NOTE — ED Notes (Signed)
Pt being verbally hostile calling rn a bitch.
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
## 2015-06-25 NOTE — BH Assessment (Signed)
Accepted to OBS bed #4. Support paperwork completed. Call report #[phone]. [facility]

## 2015-06-26 DIAGNOSIS — F332 Major depressive disorder, recurrent severe without psychotic features: Secondary | ICD-10-CM | POA: Diagnosis not present

## 2015-06-26 DIAGNOSIS — F4310 Post-traumatic stress disorder, unspecified: Secondary | ICD-10-CM | POA: Diagnosis present

## 2015-06-26 MED ORDER — DIVALPROEX SODIUM 250 MG PO DR TAB: 250.0000 mg | DELAYED_RELEASE_TABLET | Freq: Two times a day (BID) | ORAL | Status: DC

## 2015-06-26 MED ORDER — IBUPROFEN 800 MG PO TABS
800.0000 mg | ORAL_TABLET | Freq: Three times a day (TID) | ORAL | Status: DC | PRN
  Administered 2015-06-26: 800 mg via ORAL

## 2015-06-26 MED ORDER — HYDROXYZINE HCL 25 MG PO TABS: 25.0000 mg | ORAL_TABLET | Freq: Four times a day (QID) | ORAL | Status: DC | PRN

## 2015-06-26 MED ORDER — IBUPROFEN 800 MG PO TABS
ORAL_TABLET | ORAL | Status: AC
  Filled 2015-06-26: qty 1

## 2015-06-26 NOTE — Progress Notes (Signed)
Patient ID: Henreitta CeaRoxanne J Wach, female   DOB: 08/27/1985, 30 y.o.   MRN: [id number] Having a lot of pain in her left wrist from inflammation in her synovial fluids in her joint in her hand and wrist. Gave her some Motrin for her complaint of pain of an 8. She is sad and stressed by her custody issues with her 518 yo son, and also has 266 yo twins her aunt is raising in [place]. Tearful when discussing her kids.  Also, no longer employed due to her having a recent seizure..States she has outpatient services for mental health but unable to name the agency. States he support system is her non profit agencies she is working with. Will consult our counselor to help her with outpatient referrals once discharged. Seen by Renata Capriceonrad NP and he doesn't believe she is in need of an inpatient hospitalization.
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
## 2015-06-26 NOTE — BHH Counselor (Signed)
Pt denies SI/HI, biggest stressor is gaining custody of her son. Pt has taken psychotropic medications in the past, but is not currently on any medications. Pt needs to be evaluated byA.M. extender to further determine disposition. Pt would benefit from OPT resources and medication management   Ardelle ParkLatoya McNeil, MA OBS Unit
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
## 2015-06-26 NOTE — Progress Notes (Signed)
Patient ID: Patricia Drake, female   DOB: June 14, 1986, 30 y.o.   MRN: [id number] Discharge Note-Seen earlier today by NP Maura Crandallonrad W. And Patricia Drake determined she can be discharged today. Wrote her Rx's and discharge order. She already has an outpatient provider for her mental health services. She has a place to live and lives alone. She is pleasant and upbeat and denies any thoughts to hurt self or others. She states she is ready to go. All of her property returned to her and escorted to lobby for discharge. Gave her a bus ticket to help with her transport.

## 2015-08-04 ENCOUNTER — Emergency Department (HOSPITAL_COMMUNITY): Payer: Self-pay

## 2015-08-04 ENCOUNTER — Emergency Department (HOSPITAL_COMMUNITY)
Admission: EM | Admit: 2015-08-04 | Discharge: 2015-08-04 | Disposition: A | Discharge status: Home / Self Care | Payer: Self-pay | Attending: Emergency Medicine | Admitting: Emergency Medicine

## 2015-08-04 ENCOUNTER — Encounter (HOSPITAL_COMMUNITY): Payer: Self-pay

## 2015-08-04 DIAGNOSIS — Z79899 Other long term (current) drug therapy: Secondary | ICD-10-CM | POA: Insufficient documentation

## 2015-08-04 DIAGNOSIS — M659 Synovitis and tenosynovitis, unspecified: Secondary | ICD-10-CM

## 2015-08-04 DIAGNOSIS — Z8669 Personal history of other diseases of the nervous system and sense organs: Secondary | ICD-10-CM | POA: Insufficient documentation

## 2015-08-04 DIAGNOSIS — Z8719 Personal history of other diseases of the digestive system: Secondary | ICD-10-CM | POA: Insufficient documentation

## 2015-08-04 DIAGNOSIS — F329 Major depressive disorder, single episode, unspecified: Secondary | ICD-10-CM | POA: Insufficient documentation

## 2015-08-04 DIAGNOSIS — R569 Unspecified convulsions: Secondary | ICD-10-CM | POA: Insufficient documentation

## 2015-08-04 DIAGNOSIS — F419 Anxiety disorder, unspecified: Secondary | ICD-10-CM | POA: Insufficient documentation

## 2015-08-04 DIAGNOSIS — E669 Obesity, unspecified: Secondary | ICD-10-CM | POA: Insufficient documentation

## 2015-08-04 DIAGNOSIS — M779 Enthesopathy, unspecified: Secondary | ICD-10-CM | POA: Insufficient documentation

## 2015-08-04 DIAGNOSIS — Z8709 Personal history of other diseases of the respiratory system: Secondary | ICD-10-CM | POA: Insufficient documentation

## 2015-08-04 DIAGNOSIS — F4310 Post-traumatic stress disorder, unspecified: Secondary | ICD-10-CM | POA: Insufficient documentation

## 2015-08-04 DIAGNOSIS — Z87891 Personal history of nicotine dependence: Secondary | ICD-10-CM | POA: Insufficient documentation

## 2015-08-04 LAB — BASIC METABOLIC PANEL
Anion gap: 8 (ref 5–15)
BUN: 8 mg/dL (ref 6–20)
CALCIUM: 9 mg/dL (ref 8.9–10.3)
CO2: 24 mmol/L (ref 22–32)
CREATININE: 0.6 mg/dL (ref 0.44–1.00)
Chloride: 107 mmol/L (ref 101–111)
GFR calc Af Amer: >60 mL/min (ref >60)
GLUCOSE: 84 mg/dL (ref 65–99)
Potassium: 4.4 mmol/L (ref 3.5–5.1)
SODIUM: 139 mmol/L (ref 135–145)

## 2015-08-04 LAB — CBC WITH DIFFERENTIAL/PLATELET
Basophils Absolute: 0.1 10*3/uL (ref 0.0–0.1)
Basophils Relative: 1 %
EOS ABS: 0.6 10*3/uL (ref 0.0–0.7)
EOS PCT: 6 %
HCT: 39.8 % (ref 36.0–46.0)
Hemoglobin: 13.3 g/dL (ref 12.0–15.0)
LYMPHS ABS: 1.8 10*3/uL (ref 0.7–4.0)
LYMPHS PCT: 20 %
MCH: 29.6 pg (ref 26.0–34.0)
MCHC: 33.4 g/dL (ref 30.0–36.0)
MCV: 88.6 fL (ref 78.0–100.0)
MONO ABS: 0.6 10*3/uL (ref 0.1–1.0)
Monocytes Relative: 7 %
Neutro Abs: 6.2 10*3/uL (ref 1.7–7.7)
Neutrophils Relative %: 66 %
PLATELETS: 413 10*3/uL — AB (ref 150–400)
RBC: 4.49 MIL/uL (ref 3.87–5.11)
RDW: 13.8 % (ref 11.5–15.5)
WBC: 9.2 10*3/uL (ref 4.0–10.5)

## 2015-08-04 LAB — VALPROIC ACID LEVEL: Valproic Acid Lvl: <10 ug/mL — ABNORMAL LOW (ref 50.0–100.0)

## 2015-08-04 LAB — PHENYTOIN LEVEL, TOTAL: Phenytoin Lvl: <2.5 ug/mL — ABNORMAL LOW (ref 10.0–20.0)

## 2015-08-04 MED ORDER — DIVALPROEX SODIUM 250 MG PO DR TAB: 250.0000 mg | DELAYED_RELEASE_TABLET | Freq: Two times a day (BID) | ORAL | Status: DC

## 2015-08-04 MED ORDER — SODIUM CHLORIDE 0.9 % IV SOLN
1000.0000 mg | Freq: Once | INTRAVENOUS | Status: AC
  Administered 2015-08-04: 1000 mg via INTRAVENOUS
  Filled 2015-08-04: qty 20

## 2015-08-04 MED ORDER — NAPROXEN 500 MG PO TABS
500.0000 mg | ORAL_TABLET | Freq: Once | ORAL | Status: AC
  Administered 2015-08-04: 500 mg via ORAL
  Filled 2015-08-04: qty 1

## 2015-08-04 MED ORDER — DIVALPROEX SODIUM 250 MG PO DR TAB
250.0000 mg | DELAYED_RELEASE_TABLET | Freq: Once | ORAL | Status: AC
  Administered 2015-08-04: 250 mg via ORAL
  Filled 2015-08-04: qty 1

## 2015-08-04 MED ORDER — SODIUM CHLORIDE 0.9 % IV BOLUS (SEPSIS)
1000.0000 mL | Freq: Once | INTRAVENOUS | Status: AC
  Administered 2015-08-04: 1000 mL via INTRAVENOUS

## 2015-08-04 MED ORDER — NAPROXEN 500 MG PO TABS: 500.0000 mg | ORAL_TABLET | Freq: Two times a day (BID) | ORAL | Status: DC

## 2015-08-04 MED ORDER — PHENYTOIN SODIUM EXTENDED 100 MG PO CAPS: 400.0000 mg | ORAL_CAPSULE | Freq: Every day | ORAL | Status: DC

## 2015-08-04 NOTE — Care Management Note (Addendum)
Case Management Note  Patient Details  Name: STACY SAILER MRN: [id number] Date of Birth: [birth year]  Subjective/Objective:     seizures               Action/Plan:  NCM spoke to pt in the room and discussed medications. States she cannot afford medications. States she recently lost her job. Explained MATCH program. States she used program last year.   1600 NCM went back to room to speak to pt and give MATCH letter. ED RN states pt had already left. Attempted to contact pt via phone. Line busy. Will attempt call 08/05/2015. Message sent to [facility] RN liaison to arrange f/u appt.   Expected Discharge Date:  08/04/2015              Expected Discharge Plan:  Home/Self Care  In-House Referral:  NA  Discharge planning Services  MATCH Program, Medication Assistance, Indigent Health Clinic, CM Consult  Post Acute Care Choice:  NA Choice offered to:  NA  DME Arranged:  N/A DME Agency:  NA  HH Arranged:  Speech Therapy, NA HH Agency:  NA  Status of Service:  Completed, signed off  Medicare Important Message Given:    Date Medicare IM Given:    Medicare IM give by:    Date Additional Medicare IM Given:    Additional Medicare Important Message give by:     If discussed at Long Length of Stay Meetings, dates discussed:    Additional Comments:  Elliot Cousin, RN 08/04/2015, 4:25 PM

## 2015-08-04 NOTE — ED Notes (Signed)
Bed: WA18 Expected date:  Expected time:  Means of arrival:  Comments: seizure 

## 2015-08-04 NOTE — Discharge Instructions (Signed)
You were seen in the ER today for evaluation after a seizure. Your labs were normal. I will give you prescriptions for your seizure medications, Depakote and Dilantin. The Case Manager spoke with you about financial resources for these medications and helped you get set up with [facility] to establish primary care. Please follow up with [facility] as soon as possible.  Regarding your hand pain, your x-ray was normal. As we discussed, I believe you have tenosynovitis which is usually due to overuse. I will give you a prescription for naproxen. You may wear the splint as needed and wear it to bed. If your symptoms do not improve, your primary care provider might want to consider local steroid injections in the future.  Return to the ER for new or worsening symptoms.

## 2015-08-04 NOTE — ED Notes (Signed)
Pt presents via EMS from the bus stop with c/o seizures. Pt was with her friends waiting on a bus and friends reported that she fell to the ground, minimal body movement, clonic in nature. Pt reported she was in and out of the seizure for a total of about 3 minutes. Per EMS, she was not post-ictal upon their arrival. Pt reports a seizure disorder that is generally brought on by anxiety, takes depakote at home for her seizures. No issues or seizures during transport, no injury from fall, alert and oriented.

## 2015-08-04 NOTE — Progress Notes (Signed)
MEDICATION RELATED CONSULT NOTE - INITIAL   Pharmacy Consult for Phenytoin load Indication: Seizure  Allergies  Allergen Reactions  . Sulfa Antibiotics Other (See Comments)    Childhood reaction.Unknown     Patient Measurements: Weight: 200 lb (90.719 kg) Adjusted Body Weight: 62kg  Vital Signs: Temp: 98 F (36.7 C) (02/19 1239) BP: 132/75 mmHg (02/19 1239) Pulse Rate: 102 (02/19 1239) Intake/Output from previous day:   Intake/Output from this shift:    Labs:  Recent Labs  08/04/15 1100  WBC 9.2  HGB 13.3  HCT 39.8  PLT 413*  CREATININE 0.60   Estimated Creatinine Clearance: 109.6 mL/min (by C-G formula based on Cr of 0.6).   Microbiology: No results found for this or any previous visit (from the past 720 hour(s)).  Medical History: Past Medical History  Diagnosis Date  . Seizures (HCC)   . PTSD (post-traumatic stress disorder)   . Depression   . Anxiety   . Chronic diarrhea   . Collapsed lung   . Carpal tunnel syndrome     Medications:  Prescribed Depakote  PO BID & Dilantin  PO QHS  Assessment: 30 yo F with hx of seizures and reported non-compliance with seizure medications was brought to ED after witnessed seizure activity.   Phenytoin & Valproic acid levels are undetectable which is consistent with patient report of not taking these medications.    Goal of Therapy:  Phenytoin level 10-43mcg/ml  Plan:  Phenytoin  IV x1 loading dose (~16mg /kg ABW) Previously prescribed oral dose of  po QHS is appropriate to resume tonight (~6.5mg /kg ABW)  Elson Clan 08/04/2015,1:03 PM

## 2015-08-04 NOTE — ED Provider Notes (Signed)
CSN: [id number]     Arrival date & time 08/04/15  0957 History   First MD Initiated Contact with Patient 08/04/15 1056     Chief Complaint  Patient presents with  . Seizures    HPI   Patricia Drake is an 30 y.o. female with history of seizures who presents to the ED for evaluation after seizures. She states she was waiting at the bus stop with her friend when she started getting an acidic taste in her mouth which usually lets her know she is about to have a seizure. She states she laid down on the grass by the bus stop and does not remember all of the next few minutes. She states she remembers twitching on the floor. Her friend is in the room with her and states pt had three episodes of ~30 seconds each of twitching at different parts of her body. Pt's friend does not remember if they were unilateral, but states pt was not "shaking all over." Pt was not post ictal upon EMS arrival. 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 She states that she has been unable to afford her seizure medications because she recently lost her job. She states that she typically takes Depakote  BID and Dilantin  QHS. She states in the ED now she feels pretty much back to baseline. She does report pain in her left hand. She states that the pain has been developing over the past few months, especially when she is trying to grip something, and feels like pain from the radial side her wrist that radiates into the dorsum of her hand.. She states that now in the ED it hurts worse than it typically does and she is not sure if she fell onto it earlier today.  Past Medical History  Diagnosis Date  . Seizures (HCC)   . PTSD (post-traumatic stress disorder)   . Depression   . Anxiety   . Chronic diarrhea   . Collapsed lung   . Carpal tunnel syndrome    Past Surgical History  Procedure Laterality Date  . Cesarean section    . Tubal ligation     No family history on file. Social History  Substance Use Topics  .  Smoking status: Former Smoker    Quit date: 11/11/2012  . Smokeless tobacco: Never Used  . Alcohol Use: No   OB History    No data available     Review of Systems  All other systems reviewed and are negative.     Allergies  Sulfa antibiotics  Home Medications   Prior to Admission medications   Medication Sig Start Date End Date Taking? Authorizing Provider  divalproex (DEPAKOTE) 250 MG DR tablet Take 1 tablet (250 mg total) by mouth 2 (two) times daily. 06/26/15  Yes Beau Fanny, FNP  hydrOXYzine (ATARAX/VISTARIL) 25 MG tablet Take 1 tablet (25 mg total) by mouth every 6 (six) hours as needed for anxiety. 06/26/15  Yes Beau Fanny, FNP  phenytoin (DILANTIN) 100 MG ER capsule Take 400 mg by mouth at bedtime.    Historical Provider, MD  sertraline (ZOLOFT) 100 MG tablet Take 100 mg by mouth daily.    Historical Provider, MD  traMADol (ULTRAM) 50 MG tablet Take 150 mg by mouth every 6 (six) hours as needed for moderate pain.    Historical Provider, MD  traZODone (DESYREL) 150 MG tablet Take 150 mg by mouth at bedtime.    Historical Provider, MD   BP 132/75 mmHg  Pulse 102  Temp(Src) 98 F (36.7 C)  Resp 18  SpO2 98%  LMP 07/16/2015 Physical Exam  Constitutional: She is oriented to person, place, and time.  Obese, NAD  HENT:  Right Ear: External ear normal.  Left Ear: External ear normal.  Nose: Nose normal.  Mouth/Throat: Oropharynx is clear and moist. No oropharyngeal exudate.  Eyes: Conjunctivae and EOM are normal. Pupils are equal, round, and reactive to light. No scleral icterus.  Neck: Normal range of motion. Neck supple.  Cardiovascular: Normal rate, regular rhythm, normal heart sounds and intact distal pulses.   Pulmonary/Chest: Effort normal and breath sounds normal. No respiratory distress. She has no wheezes. She exhibits no tenderness.  Abdominal: Soft. Bowel sounds are normal. She exhibits no distension. There is no tenderness. There is no rebound and no  guarding.  Musculoskeletal: She exhibits no edema.  Left hand diffusely TTP, particularly at base of thumb. +finklesteins' test.   Neurological: She is alert and oriented to person, place, and time. She has normal strength and normal reflexes. No cranial nerve deficit or sensory deficit. Gait normal. GCS eye subscore is 4. GCS verbal subscore is 5. GCS motor subscore is 6.  Skin: Skin is warm and dry.  Psychiatric: She has a normal mood and affect.  Nursing note and vitals reviewed.  Filed Vitals:   08/04/15 1239 08/04/15 1301 08/04/15 1440 08/04/15 1535  BP: 132/75  136/76 136/74  Pulse: 102  98 99  Temp: 98 F (36.7 C)  98 F (36.7 C) 98 F (36.7 C)  TempSrc:   Oral Oral  Resp: Weight:  90.719 kg    SpO2: 98%  99% 99%     ED Course  Procedures (including critical care time) Labs Review Labs Reviewed  PHENYTOIN LEVEL, TOTAL - Abnormal; Notable for the following:    Phenytoin Lvl <2.5 (*)    All other components within normal limits  VALPROIC ACID LEVEL - Abnormal; Notable for the following:    Valproic Acid Lvl <10 (*)    All other components within normal limits  CBC WITH DIFFERENTIAL/PLATELET - Abnormal; Notable for the following:    Platelets 413 (*)    All other components within normal limits  BASIC METABOLIC PANEL  RPR  HIV ANTIBODY (ROUTINE TESTING)  HEPATITIS PANEL, ACUTE    Imaging Review Dg Hand Complete Left  08/04/2015  CLINICAL DATA:  Seizure and fall EXAM: LEFT HAND - COMPLETE 3+ VIEW COMPARISON:  03/19/2015 FINDINGS: No acute fracture.  No dislocation. IMPRESSION: No acute bony pathology. Electronically Signed   By: Jolaine Click M.D.   On: 08/04/2015 12:35   I have personally reviewed and evaluated these images and lab results as part of my medical decision-making.   EKG Interpretation None      MDM   Final diagnoses:  Seizure (HCC)  Tenosynovitis of left hand    Pt at baseline mental status. No focal neuro findings on exam. Labs  unremarkable. As expected, sub therapeutic levels of depakote and dilantin. Will give home dose of depakote and load with phenytoin per pharmacy consult. Naproxen given for hand pain.  Hand XR negative. Suspect tenosynovitis. Will give brace. Encouraged NSAIDS.   Pt remains seizure free in the ED. She states she feels back to baseline. Case management consulted to provide assistance for medications. Pt also set up to see Wellness to establish PCP for f/u. Pt stable for discharge. ER return precautions given.    Ace Gins  Uma Jerde, PA-C 08/05/15 1453  Zadie Rhine, MD 08/05/15 [phone]

## 2015-08-04 NOTE — ED Provider Notes (Signed)
ED ECG REPORT   Date: 08/04/2015 1013am  Rate: 83  Rhythm: normal sinus rhythm  QRS Axis: normal  Intervals: normal  ST/T Wave abnormalities: normal  Conduction Disutrbances:none   I have personally reviewed the EKG tracing and agree with the computerized printout as noted.   Zadie Rhine, MD 08/04/15 1255

## 2015-08-05 ENCOUNTER — Telehealth: Payer: Self-pay

## 2015-08-05 NOTE — Telephone Encounter (Signed)
Message received form Isidoro Donning, RN CM requesting a hospital follow up appointment for the patient.  4 calls placed to # [phone] (H) and the phone either rang fast busy or there was no ringing /response.  Update provided to A. Mariea Stable, RN CM

## 2015-08-05 NOTE — Telephone Encounter (Signed)
Attempted to contact the patient again to discuss scheduling a follow up appointment at the [facility]. Call placed to # [phone] (H) multiple times and the phone rings fast busy or does not ring at all.

## 2015-08-06 ENCOUNTER — Telehealth: Payer: Self-pay

## 2015-08-06 DIAGNOSIS — Z23 Encounter for immunization: Secondary | ICD-10-CM

## 2015-08-06 DIAGNOSIS — G40909 Epilepsy, unspecified, not intractable, without status epilepticus: Secondary | ICD-10-CM

## 2015-08-06 DIAGNOSIS — J4530 Mild persistent asthma, uncomplicated: Secondary | ICD-10-CM

## 2015-08-06 DIAGNOSIS — M19039 Primary osteoarthritis, unspecified wrist: Secondary | ICD-10-CM

## 2015-08-06 LAB — HEPATITIS PANEL, ACUTE
HCV Ab: 0.1 s/co ratio (ref 0.0–0.9)
HEP A IGM: NEGATIVE
HEP B C IGM: NEGATIVE
Hepatitis B Surface Ag: NEGATIVE

## 2015-08-06 LAB — RPR: RPR Ser Ql: NONREACTIVE

## 2015-08-06 LAB — HIV ANTIBODY (ROUTINE TESTING W REFLEX): HIV SCREEN 4TH GENERATION: NONREACTIVE

## 2015-08-06 NOTE — Telephone Encounter (Signed)
Called the patient again to discuss scheduling a follow up appointment at the [facility]. Call placed to #[phone] (H) multiple times and the phone rang a fast busy or there was no response at all.

## 2015-08-07 ENCOUNTER — Telehealth: Payer: Self-pay

## 2015-08-07 NOTE — Telephone Encounter (Signed)
Call placed to the patient and she was agreeable to scheduling an appointment at the [facility].  An appointment has been scheduled for 08/08/15 @ 1700.  She was very appreciative of the appointment and also stated that she was going to stop by the clinic today to pick up an application for the orange card.  Update provided to Isidoro Donning, RN CM.
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
## 2015-08-08 ENCOUNTER — Encounter: Payer: Self-pay | Admitting: Family Medicine

## 2015-08-08 ENCOUNTER — Ambulatory Visit: Payer: Self-pay | Attending: Family Medicine | Admitting: Family Medicine

## 2015-08-08 VITALS — BP 116/77 | HR 88 | Temp 98.1°F | Resp 16 | Ht 62.0 in | Wt 209.0 lb

## 2015-08-08 DIAGNOSIS — G40909 Epilepsy, unspecified, not intractable, without status epilepticus: Secondary | ICD-10-CM | POA: Insufficient documentation

## 2015-08-08 DIAGNOSIS — A084 Viral intestinal infection, unspecified: Secondary | ICD-10-CM

## 2015-08-08 DIAGNOSIS — R112 Nausea with vomiting, unspecified: Secondary | ICD-10-CM | POA: Insufficient documentation

## 2015-08-08 DIAGNOSIS — M25532 Pain in left wrist: Secondary | ICD-10-CM | POA: Insufficient documentation

## 2015-08-08 DIAGNOSIS — R110 Nausea: Secondary | ICD-10-CM

## 2015-08-08 DIAGNOSIS — Z131 Encounter for screening for diabetes mellitus: Secondary | ICD-10-CM

## 2015-08-08 LAB — POCT GLYCOSYLATED HEMOGLOBIN (HGB A1C)

## 2015-08-08 MED ORDER — ALBUTEROL SULFATE HFA 108 (90 BASE) MCG/ACT IN AERS: 2.0000 | INHALATION_SPRAY | Freq: Four times a day (QID) | RESPIRATORY_TRACT | Status: DC | PRN

## 2015-08-08 MED ORDER — ONDANSETRON HCL 4 MG PO TABS
4.0000 mg | ORAL_TABLET | Freq: Once | ORAL | Status: AC
  Administered 2015-08-08: 4 mg via ORAL

## 2015-08-08 MED ORDER — PHENYTOIN SODIUM EXTENDED 100 MG PO CAPS: 400.0000 mg | ORAL_CAPSULE | Freq: Every day | ORAL | Status: DC

## 2015-08-08 MED ORDER — WRIST SPLINT/LEFT MEDIUM MISC: 1.0000 [IU] | Freq: Once | Status: DC

## 2015-08-08 MED ORDER — DIVALPROEX SODIUM 250 MG PO DR TAB: 250.0000 mg | DELAYED_RELEASE_TABLET | Freq: Two times a day (BID) | ORAL | Status: DC

## 2015-08-08 MED ORDER — NAPROXEN 500 MG PO TABS: 500.0000 mg | ORAL_TABLET | Freq: Two times a day (BID) | ORAL | Status: DC

## 2015-08-08 NOTE — Patient Instructions (Signed)
It was a pleasure to see you today.   For the nausea, you were given ondansetron (zofran) ; this should help with the nausea.   For the seizure disorder, I sent your seizure medicines to the [facility].    For your wrist pain, I am reordering x-rays of the left wrist and hand. I recommend using a splint (prescription given) especially at nighttime when you are sleeping. I am discontinuing tramadol because of the risk of worsening seizures; I am changing you to NAPROXEN  tablets, take 1 tablet by mouth every 12 hours with something to eat.   Follow up here in 1 month to see how you are doing.

## 2015-08-08 NOTE — Progress Notes (Signed)
Patient's here for hospital f/up seizures. No further complaints  Patient states she's having stomach pain with nausea, no vomiting. Rated 6/10.  Patient having swelling in left hand since hospital, but worse post-discharge. Rated 9/10, described as throbbing and painful.  Patient here to est. care with PCP.  Patient requesting refills on meds.
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
## 2015-08-09 ENCOUNTER — Emergency Department (HOSPITAL_COMMUNITY)
Admission: EM | Admit: 2015-08-09 | Discharge: 2015-08-10 | Disposition: A | Discharge status: Home / Self Care | Payer: Self-pay | Attending: Emergency Medicine | Admitting: Emergency Medicine

## 2015-08-09 ENCOUNTER — Encounter (HOSPITAL_COMMUNITY): Payer: Self-pay | Admitting: *Deleted

## 2015-08-09 DIAGNOSIS — Z7951 Long term (current) use of inhaled steroids: Secondary | ICD-10-CM | POA: Insufficient documentation

## 2015-08-09 DIAGNOSIS — R197 Diarrhea, unspecified: Secondary | ICD-10-CM | POA: Insufficient documentation

## 2015-08-09 DIAGNOSIS — Z9851 Tubal ligation status: Secondary | ICD-10-CM | POA: Insufficient documentation

## 2015-08-09 DIAGNOSIS — F4310 Post-traumatic stress disorder, unspecified: Secondary | ICD-10-CM | POA: Insufficient documentation

## 2015-08-09 DIAGNOSIS — Z3202 Encounter for pregnancy test, result negative: Secondary | ICD-10-CM | POA: Insufficient documentation

## 2015-08-09 DIAGNOSIS — R112 Nausea with vomiting, unspecified: Secondary | ICD-10-CM | POA: Insufficient documentation

## 2015-08-09 DIAGNOSIS — F329 Major depressive disorder, single episode, unspecified: Secondary | ICD-10-CM | POA: Insufficient documentation

## 2015-08-09 DIAGNOSIS — Z87891 Personal history of nicotine dependence: Secondary | ICD-10-CM | POA: Insufficient documentation

## 2015-08-09 DIAGNOSIS — Z79899 Other long term (current) drug therapy: Secondary | ICD-10-CM | POA: Insufficient documentation

## 2015-08-09 DIAGNOSIS — F419 Anxiety disorder, unspecified: Secondary | ICD-10-CM | POA: Insufficient documentation

## 2015-08-09 DIAGNOSIS — Z8709 Personal history of other diseases of the respiratory system: Secondary | ICD-10-CM | POA: Insufficient documentation

## 2015-08-09 DIAGNOSIS — R1084 Generalized abdominal pain: Secondary | ICD-10-CM | POA: Insufficient documentation

## 2015-08-09 DIAGNOSIS — Z791 Long term (current) use of non-steroidal anti-inflammatories (NSAID): Secondary | ICD-10-CM | POA: Insufficient documentation

## 2015-08-09 DIAGNOSIS — Z8669 Personal history of other diseases of the nervous system and sense organs: Secondary | ICD-10-CM | POA: Insufficient documentation

## 2015-08-09 NOTE — Progress Notes (Signed)
Patient noted to have been seen in the ED 9 times within the last six months. Patient with Hx of PTSD, depression and seizures.  Per chart review, patient has had a visit with the congregational RN on 02/22.  Patient has also been seen by Dr. Raylene Miyamoto at the [facility].  Per chart review, patient has recently started a job with [employer] and has been seen at the [facility].  Patient has been placed in [facility] program for medication assistance on 02/19.  Patient will not be eligible for medication cost assistance until this time next year.

## 2015-08-09 NOTE — ED Notes (Addendum)
Staff member went to get pt's blood, the pt was angry that she had to wait for a room after coming by ambulance. Tried to get the pt some help by seeing if she could get something for nausea, pt became angry and started yelling. After asking the pt to calm down she kept yelling and staff told her that her blood will be drawn when she gets a room and calms down.
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
## 2015-08-10 LAB — COMPREHENSIVE METABOLIC PANEL
ALBUMIN: 4.1 g/dL (ref 3.5–5.0)
ALK PHOS: 67 U/L (ref 38–126)
ALT: 18 U/L (ref 14–54)
AST: 21 U/L (ref 15–41)
Anion gap: 9 (ref 5–15)
BILIRUBIN TOTAL: 0.6 mg/dL (ref 0.3–1.2)
BUN: 13 mg/dL (ref 6–20)
CALCIUM: 8.9 mg/dL (ref 8.9–10.3)
CO2: 25 mmol/L (ref 22–32)
CREATININE: 0.7 mg/dL (ref 0.44–1.00)
Chloride: 106 mmol/L (ref 101–111)
GFR calc Af Amer: >60 mL/min (ref >60)
GFR calc non Af Amer: >60 mL/min (ref >60)
GLUCOSE: 107 mg/dL — AB (ref 65–99)
Potassium: 4 mmol/L (ref 3.5–5.1)
Sodium: 140 mmol/L (ref 135–145)
Total Protein: 8 g/dL (ref 6.5–8.1)

## 2015-08-10 LAB — CBC WITH DIFFERENTIAL/PLATELET
BASOS ABS: 0 10*3/uL (ref 0.0–0.1)
BASOS PCT: 0 %
Eosinophils Absolute: 0.1 10*3/uL (ref 0.0–0.7)
Eosinophils Relative: 1 %
HEMATOCRIT: 43.6 % (ref 36.0–46.0)
HEMOGLOBIN: 14.7 g/dL (ref 12.0–15.0)
Lymphocytes Relative: 3 %
Lymphs Abs: 0.3 10*3/uL — ABNORMAL LOW (ref 0.7–4.0)
MCH: 30.1 pg (ref 26.0–34.0)
MCHC: 33.7 g/dL (ref 30.0–36.0)
MCV: 89.2 fL (ref 78.0–100.0)
MONOS PCT: 3 %
Monocytes Absolute: 0.3 10*3/uL (ref 0.1–1.0)
NEUTROS ABS: 8.7 10*3/uL — AB (ref 1.7–7.7)
NEUTROS PCT: 93 %
Platelets: 395 10*3/uL (ref 150–400)
RBC: 4.89 MIL/uL (ref 3.87–5.11)
RDW: 13.9 % (ref 11.5–15.5)
WBC: 9.3 10*3/uL (ref 4.0–10.5)

## 2015-08-10 LAB — URINE MICROSCOPIC-ADD ON

## 2015-08-10 LAB — URINALYSIS, ROUTINE W REFLEX MICROSCOPIC
Bilirubin Urine: NEGATIVE
GLUCOSE, UA: NEGATIVE mg/dL
HGB URINE DIPSTICK: NEGATIVE
Ketones, ur: 40 mg/dL — AB
Nitrite: NEGATIVE
Protein, ur: NEGATIVE mg/dL
SPECIFIC GRAVITY, URINE: 1.035 — AB (ref 1.005–1.030)
pH: 6.5 (ref 5.0–8.0)

## 2015-08-10 LAB — I-STAT BETA HCG BLOOD, ED (MC, WL, AP ONLY): I-stat hCG, quantitative: <5 m[IU]/mL (ref <5)

## 2015-08-10 LAB — LIPASE, BLOOD: Lipase: 24 U/L (ref 11–51)

## 2015-08-10 MED ORDER — DICYCLOMINE HCL 10 MG PO CAPS
10.0000 mg | ORAL_CAPSULE | Freq: Once | ORAL | Status: AC
  Administered 2015-08-10: 10 mg via ORAL
  Filled 2015-08-10: qty 1

## 2015-08-10 MED ORDER — DIPHENOXYLATE-ATROPINE 2.5-0.025 MG PO TABS: 2.0000 | ORAL_TABLET | Freq: Four times a day (QID) | ORAL | Status: DC | PRN

## 2015-08-10 MED ORDER — SODIUM CHLORIDE 0.9 % IV BOLUS (SEPSIS)
1000.0000 mL | Freq: Once | INTRAVENOUS | Status: AC
  Administered 2015-08-10: 1000 mL via INTRAVENOUS

## 2015-08-10 MED ORDER — PROMETHAZINE HCL 25 MG PO TABS: 25.0000 mg | ORAL_TABLET | Freq: Four times a day (QID) | ORAL | Status: DC | PRN

## 2015-08-10 MED ORDER — HYDROMORPHONE HCL 1 MG/ML IJ SOLN
1.0000 mg | Freq: Once | INTRAMUSCULAR | Status: AC
  Administered 2015-08-10: 1 mg via INTRAVENOUS
  Filled 2015-08-10: qty 1

## 2015-08-10 MED ORDER — ONDANSETRON HCL 4 MG/2ML IJ SOLN
4.0000 mg | Freq: Once | INTRAMUSCULAR | Status: AC
  Administered 2015-08-10: 4 mg via INTRAVENOUS
  Filled 2015-08-10: qty 2

## 2015-08-10 NOTE — Discharge Instructions (Signed)
Abdominal Pain, Adult °Many things can cause abdominal pain. Usually, abdominal pain is not caused by a disease and will improve without treatment. It can often be observed and treated at home. Your health care provider will do a physical exam and possibly order blood tests and X-rays to help determine the seriousness of your pain. However, in many cases, more time must pass before a clear cause of the pain can be found. Before that point, your health care provider may not know if you need more testing or further treatment. °HOME CARE INSTRUCTIONS °Monitor your abdominal pain for any changes. The following actions may help to alleviate any discomfort you are experiencing: °· Only take over-the-counter or prescription medicines as directed by your health care provider. °· Do not take laxatives unless directed to do so by your health care provider. °· Try a clear liquid diet (broth, tea, or water) as directed by your health care provider. Slowly move to a bland diet as tolerated. °SEEK MEDICAL CARE IF: °· You have unexplained abdominal pain. °· You have abdominal pain associated with nausea or diarrhea. °· You have pain when you urinate or have a bowel movement. °· You experience abdominal pain that wakes you in the night. °· You have abdominal pain that is worsened or improved by eating food. °· You have abdominal pain that is worsened with eating fatty foods. °· You have a fever. °SEEK IMMEDIATE MEDICAL CARE IF: °· Your pain does not go away within 2 hours. °· You keep throwing up (vomiting). °· Your pain is felt only in portions of the abdomen, such as the right side or the left lower portion of the abdomen. °· You pass bloody or black tarry stools. °MAKE SURE YOU: °· Understand these instructions. °· Will watch your condition. °· Will get help right away if you are not doing well or get worse. °  °This information is not intended to replace advice given to you by your health care provider. Make sure you discuss  any questions you have with your health care provider. °  °Document Released: 03/11/2005 Document Revised: 02/20/2015 Document Reviewed: 02/08/2013 °Elsevier Interactive Patient Education ©2016 [employer] ° °Diarrhea °Diarrhea is frequent loose and watery bowel movements. It can cause you to feel weak and dehydrated. Dehydration can cause you to become tired and thirsty, have a dry mouth, and have decreased urination that often is dark yellow. Diarrhea is a sign of another problem, most often an infection that will not last long. In most cases, diarrhea typically lasts 2-3 days. However, it can last longer if it is a sign of something more serious. It is important to treat your diarrhea as directed by your caregiver to lessen or prevent future episodes of diarrhea. °CAUSES  °Some common causes include: °· Gastrointestinal infections caused by viruses, bacteria, or parasites. °· Food poisoning or food allergies. °· Certain medicines, such as antibiotics, chemotherapy, and laxatives. °· Artificial sweeteners and fructose. °· Digestive disorders. °HOME CARE INSTRUCTIONS °· Ensure adequate fluid intake (hydration): Have 1 cup (8 oz) of fluid for each diarrhea episode. Avoid fluids that contain simple sugars or sports drinks, fruit juices, whole milk products, and sodas. Your urine should be clear or pale yellow if you are drinking enough fluids. Hydrate with an oral rehydration solution that you can purchase at pharmacies, retail stores, and online. You can prepare an oral rehydration solution at home by mixing the following ingredients together: °·  - tsp table salt. °· ¾ tsp baking soda. °·    tsp salt substitute containing potassium chloride. °· 1  tablespoons sugar. °· 1 L (34 oz) of water. °· Certain foods and beverages may increase the speed at which food moves through the gastrointestinal (GI) tract. These foods and beverages should be avoided and include: °· Caffeinated and alcoholic beverages. °· High-fiber  foods, such as raw fruits and vegetables, nuts, seeds, and whole grain breads and cereals. °· Foods and beverages sweetened with sugar alcohols, such as xylitol, sorbitol, and mannitol. °· Some foods may be well tolerated and may help thicken stool including: °· Starchy foods, such as rice, toast, pasta, low-sugar cereal, oatmeal, grits, baked potatoes, crackers, and bagels. °· Bananas. °· Applesauce. °· Add probiotic-rich foods to help increase healthy bacteria in the GI tract, such as yogurt and fermented milk products. °· Wash your hands well after each diarrhea episode. °· Only take over-the-counter or prescription medicines as directed by your caregiver. °· Take a warm bath to relieve any burning or pain from frequent diarrhea episodes. °SEEK IMMEDIATE MEDICAL CARE IF:  °· You are unable to keep fluids down. °· You have persistent vomiting. °· You have blood in your stool, or your stools are black and tarry. °· You do not urinate in 6-8 hours, or there is only a small amount of very dark urine. °· You have abdominal pain that increases or localizes. °· You have weakness, dizziness, confusion, or light-headedness. °· You have a severe headache. °· Your diarrhea gets worse or does not get better. °· You have a fever or persistent symptoms for more than 2-3 days. °· You have a fever and your symptoms suddenly get worse. °MAKE SURE YOU:  °· Understand these instructions. °· Will watch your condition. °· Will get help right away if you are not doing well or get worse. °  °This information is not intended to replace advice given to you by your health care provider. Make sure you discuss any questions you have with your health care provider. °  °Document Released: 05/22/2002 Document Revised: 06/22/2014 Document Reviewed: 02/07/2012 °Elsevier Interactive Patient Education ©2016 [employer] ° °Nausea and Vomiting °Nausea is a sick feeling that often comes before throwing up (vomiting). Vomiting is a reflex where  stomach contents come out of your mouth. Vomiting can cause severe loss of body fluids (dehydration). Children and elderly adults can become dehydrated quickly, especially if they also have diarrhea. Nausea and vomiting are symptoms of a condition or disease. It is important to find the cause of your symptoms. °CAUSES  °· Direct irritation of the stomach lining. This irritation can result from increased acid production (gastroesophageal reflux disease), infection, food poisoning, taking certain medicines (such as nonsteroidal anti-inflammatory drugs), alcohol use, or tobacco use. °· Signals from the brain. These signals could be caused by a headache, heat exposure, an inner ear disturbance, increased pressure in the brain from injury, infection, a tumor, or a concussion, pain, emotional stimulus, or metabolic problems. °· An obstruction in the gastrointestinal tract (bowel obstruction). °· Illnesses such as diabetes, hepatitis, gallbladder problems, appendicitis, kidney problems, cancer, sepsis, atypical symptoms of a heart attack, or eating disorders. °· Medical treatments such as chemotherapy and radiation. °· Receiving medicine that makes you sleep (general anesthetic) during surgery. °DIAGNOSIS °Your caregiver may ask for tests to be done if the problems do not improve after a few days. Tests may also be done if symptoms are severe or if the reason for the nausea and vomiting is not clear. Tests may include: °· Urine tests. °·   Blood tests. °· Stool tests. °· Cultures (to look for evidence of infection). °· X-rays or other imaging studies. °Test results can help your caregiver make decisions about treatment or the need for additional tests. °TREATMENT °You need to stay well hydrated. Drink frequently but in small amounts. You may wish to drink water, sports drinks, clear broth, or eat frozen ice pops or gelatin dessert to help stay hydrated. When you eat, eating slowly may help prevent nausea. There are also some  antinausea medicines that may help prevent nausea. °HOME CARE INSTRUCTIONS  °· Take all medicine as directed by your caregiver. °· If you do not have an appetite, do not force yourself to eat. However, you must continue to drink fluids. °· If you have an appetite, eat a normal diet unless your caregiver tells you differently. °¨ Eat a variety of complex carbohydrates (rice, wheat, potatoes, bread), lean meats, yogurt, fruits, and vegetables. °¨ Avoid high-fat foods because they are more difficult to digest. °· Drink enough water and fluids to keep your urine clear or pale yellow. °· If you are dehydrated, ask your caregiver for specific rehydration instructions. Signs of dehydration may include: °¨ Severe thirst. °¨ Dry lips and mouth. °¨ Dizziness. °¨ Dark urine. °¨ Decreasing urine frequency and amount. °¨ Confusion. °¨ Rapid breathing or pulse. °SEEK IMMEDIATE MEDICAL CARE IF:  °· You have blood or brown flecks (like coffee grounds) in your vomit. °· You have black or bloody stools. °· You have a severe headache or stiff neck. °· You are confused. °· You have severe abdominal pain. °· You have chest pain or trouble breathing. °· You do not urinate at least once every 8 hours. °· You develop cold or clammy skin. °· You continue to vomit for longer than 24 to 48 hours. °· You have a fever. °MAKE SURE YOU:  °· Understand these instructions. °· Will watch your condition. °· Will get help right away if you are not doing well or get worse. °  °This information is not intended to replace advice given to you by your health care provider. Make sure you discuss any questions you have with your health care provider. °  °Document Released: 06/01/2005 Document Revised: 08/24/2011 Document Reviewed: 10/29/2010 °Elsevier Interactive Patient Education ©2016 [employer] Inc. ° °

## 2015-08-10 NOTE — ED Provider Notes (Signed)
CSN: [id number]     Arrival date & time 08/09/15  2011 History  By signing my name below, I, Patricia Drake, attest that this documentation has been prepared under the direction and in the presence of Gilda Crease, MD. Electronically Signed: Evon Drake, ED Scribe. 08/10/2015. 12:21 AM.    Chief Complaint  Patient presents with  . Abdominal Pain  . Nausea  . Emesis  . Diarrhea   The history is provided by the patient. No language interpreter was used.   HPI Comments: Patricia Drake is a 30 y.o. female who presents to the Emergency Department complaining of vomiting and diarrhea onset today at 3 PM. Pt also reports associated upper abdominal pain. Pt states that her symptoms began after eating pizza from an unknown source. Pt doesn't report any medications PTA. Pt denies HX of abdominal surgeries.   Past Medical History  Diagnosis Date  . Seizures (HCC)   . PTSD (post-traumatic stress disorder)   . Depression   . Anxiety   . Chronic diarrhea   . Collapsed lung   . Carpal tunnel syndrome    Past Surgical History  Procedure Laterality Date  . Cesarean section    . Tubal ligation     History reviewed. No pertinent family history. Social History  Substance Use Topics  . Smoking status: Former Smoker    Quit date: 11/11/2012  . Smokeless tobacco: Never Used  . Alcohol Use: No   OB History    No data available     Review of Systems  Constitutional: Negative for fever.  Gastrointestinal: Positive for nausea, vomiting, abdominal pain and diarrhea.  All other systems reviewed and are negative.     Allergies  Sulfa antibiotics  Home Medications   Prior to Admission medications   Medication Sig Start Date End Date Taking? Authorizing Provider  albuterol (PROVENTIL HFA;VENTOLIN HFA) 108 (90 Base) MCG/ACT inhaler Inhale 2 puffs into the lungs every 6 (six) hours as needed for wheezing or shortness of breath. 08/08/15   Barbaraann Barthel, MD  divalproex  (DEPAKOTE) 250 MG DR tablet Take 1 tablet (250 mg total) by mouth 2 (two) times daily. 06/26/15   Beau Fanny, FNP  divalproex (DEPAKOTE) 250 MG DR tablet Take 1 tablet (250 mg total) by mouth 2 (two) times daily. 08/08/15   Barbaraann Barthel, MD  Elastic Bandages & Supports (WRIST SPLINT/LEFT MEDIUM) MISC 1 Units by Does not apply route once. 08/08/15   Barbaraann Barthel, MD  fluticasone-salmeterol (ADVAIR HFA) [phone] MCG/ACT inhaler Inhale 2 puffs into the lungs 2 (two) times daily.    Historical Provider, MD  hydrOXYzine (ATARAX/VISTARIL) 25 MG tablet Take 1 tablet (25 mg total) by mouth every 6 (six) hours as needed for anxiety. 06/26/15   Beau Fanny, FNP  naproxen (NAPROSYN) 500 MG tablet Take 1 tablet (500 mg total) by mouth 2 (two) times daily with a meal. 08/08/15   Barbaraann Barthel, MD  phenytoin (DILANTIN) 100 MG ER capsule Take 4 capsules (400 mg total) by mouth at bedtime. 08/04/15   Ace Gins Sam, PA-C  phenytoin (DILANTIN) 100 MG ER capsule Take 4 capsules (400 mg total) by mouth at bedtime. Reported on 08/08/2015 08/08/15   Barbaraann Barthel, MD  sertraline (ZOLOFT) 100 MG tablet Take 100 mg by mouth daily.    Historical Provider, MD  traZODone (DESYREL) 150 MG tablet Take 150 mg by mouth at bedtime.    Historical Provider, MD   BP  121/84 mmHg  Pulse 92  Temp(Src) 98.9 F (37.2 C) (Oral)  Resp 16  Ht  (1.575 m)  Wt 209 lb (94.802 kg)  BMI 38.22 kg/m2  SpO2 97%  LMP 07/16/2015   Physical Exam  Constitutional: She is oriented to person, place, and time. She appears well-developed and well-nourished. No distress.  HENT:  Head: Normocephalic and atraumatic.  Right Ear: Hearing normal.  Left Ear: Hearing normal.  Nose: Nose normal.  Mouth/Throat: Oropharynx is clear and moist and mucous membranes are normal.  Eyes: Conjunctivae and EOM are normal. Pupils are equal, round, and reactive to light.  Neck: Normal range of motion. Neck supple.  Cardiovascular: Regular rhythm, S1 normal and S2  normal.  Exam reveals no gallop and no friction rub.   No murmur heard. Pulmonary/Chest: Effort normal and breath sounds normal. No respiratory distress. She exhibits no tenderness.  Abdominal: Soft. Normal appearance and bowel sounds are normal. There is no hepatosplenomegaly. There is tenderness. There is no rebound, no guarding, no tenderness at McBurney's point and negative Murphy's sign. No hernia.  Upper abdominal tenderness.   Musculoskeletal: Normal range of motion.  Neurological: She is alert and oriented to person, place, and time. She has normal strength. No cranial nerve deficit or sensory deficit. Coordination normal. GCS eye subscore is 4. GCS verbal subscore is 5. GCS motor subscore is 6.  Skin: Skin is warm, dry and intact. No rash noted. No cyanosis.  Psychiatric: She has a normal mood and affect. Her speech is normal and behavior is normal. Thought content normal.  Nursing note and vitals reviewed.   ED Course  Procedures (including critical care time) DIAGNOSTIC STUDIES: Oxygen Saturation is 97% on RA, normal by my interpretation.    COORDINATION OF CARE: 12:21 AM-Discussed treatment plan with pt at bedside and pt agreed to plan.     Labs Review Labs Reviewed  CBC WITH DIFFERENTIAL/PLATELET  COMPREHENSIVE METABOLIC PANEL  LIPASE, BLOOD  URINALYSIS, ROUTINE W REFLEX MICROSCOPIC (NOT AT [facility])  I-STAT BETA HCG BLOOD, ED ([facility], [facility], [facility] ONLY)    Imaging Review No results found.    EKG Interpretation None      MDM   Final diagnoses:  None  abd pain Vomiting diarrhea  Presents to the emergency department with complaints of upper abdominal pain with nausea and vomiting. Patient reports that symptoms initiated after eating a piece of pizza that she found at the shelter. Examination does not reveal any focal tenderness, guarding or rebound. Lab work is unremarkable. Patient feeling better after treatment with fluids and analgesia. She is not tolerating by mouth.  Patient reassured, symptomatic treatment. Urinalysis appears contaminated, will obtain urine culture.  I personally performed the services described in this documentation, which was scribed in my presence. The recorded information has been reviewed and is accurate.      Gilda Crease, MD 08/10/15 [phone]

## 2015-08-11 ENCOUNTER — Emergency Department (HOSPITAL_COMMUNITY)
Admission: EM | Admit: 2015-08-11 | Discharge: 2015-08-11 | Disposition: A | Discharge status: Home / Self Care | Payer: Self-pay | Attending: Emergency Medicine | Admitting: Emergency Medicine

## 2015-08-11 ENCOUNTER — Emergency Department (HOSPITAL_COMMUNITY): Payer: Self-pay

## 2015-08-11 ENCOUNTER — Encounter (HOSPITAL_COMMUNITY): Payer: Self-pay | Admitting: Nurse Practitioner

## 2015-08-11 DIAGNOSIS — F4310 Post-traumatic stress disorder, unspecified: Secondary | ICD-10-CM | POA: Insufficient documentation

## 2015-08-11 DIAGNOSIS — Z79899 Other long term (current) drug therapy: Secondary | ICD-10-CM | POA: Insufficient documentation

## 2015-08-11 DIAGNOSIS — F17210 Nicotine dependence, cigarettes, uncomplicated: Secondary | ICD-10-CM | POA: Insufficient documentation

## 2015-08-11 DIAGNOSIS — K529 Noninfective gastroenteritis and colitis, unspecified: Secondary | ICD-10-CM | POA: Insufficient documentation

## 2015-08-11 DIAGNOSIS — Z3202 Encounter for pregnancy test, result negative: Secondary | ICD-10-CM | POA: Insufficient documentation

## 2015-08-11 DIAGNOSIS — Z7951 Long term (current) use of inhaled steroids: Secondary | ICD-10-CM | POA: Insufficient documentation

## 2015-08-11 DIAGNOSIS — Z791 Long term (current) use of non-steroidal anti-inflammatories (NSAID): Secondary | ICD-10-CM | POA: Insufficient documentation

## 2015-08-11 DIAGNOSIS — F329 Major depressive disorder, single episode, unspecified: Secondary | ICD-10-CM | POA: Insufficient documentation

## 2015-08-11 DIAGNOSIS — F419 Anxiety disorder, unspecified: Secondary | ICD-10-CM | POA: Insufficient documentation

## 2015-08-11 LAB — I-STAT BETA HCG BLOOD, ED (MC, WL, AP ONLY): I-stat hCG, quantitative: <5 m[IU]/mL (ref <5)

## 2015-08-11 LAB — CBC
HCT: 40.8 % (ref 36.0–46.0)
Hemoglobin: 13.6 g/dL (ref 12.0–15.0)
MCH: 29.8 pg (ref 26.0–34.0)
MCHC: 33.3 g/dL (ref 30.0–36.0)
MCV: 89.3 fL (ref 78.0–100.0)
Platelets: 371 10*3/uL (ref 150–400)
RBC: 4.57 MIL/uL (ref 3.87–5.11)
RDW: 14.3 % (ref 11.5–15.5)
WBC: 6 10*3/uL (ref 4.0–10.5)

## 2015-08-11 LAB — COMPREHENSIVE METABOLIC PANEL
ALK PHOS: 65 U/L (ref 38–126)
ALT: 13 U/L — AB (ref 14–54)
AST: 21 U/L (ref 15–41)
Albumin: 3.4 g/dL — ABNORMAL LOW (ref 3.5–5.0)
Anion gap: 8 (ref 5–15)
BUN: 7 mg/dL (ref 6–20)
CALCIUM: 8.8 mg/dL — AB (ref 8.9–10.3)
CO2: 24 mmol/L (ref 22–32)
CREATININE: 0.61 mg/dL (ref 0.44–1.00)
Chloride: 108 mmol/L (ref 101–111)
Glucose, Bld: 82 mg/dL (ref 65–99)
Potassium: 3.9 mmol/L (ref 3.5–5.1)
Sodium: 140 mmol/L (ref 135–145)
Total Bilirubin: 0.2 mg/dL — ABNORMAL LOW (ref 0.3–1.2)
Total Protein: 6.9 g/dL (ref 6.5–8.1)

## 2015-08-11 MED ORDER — DICYCLOMINE HCL 20 MG PO TABS: ORAL_TABLET | ORAL | Status: DC

## 2015-08-11 MED ORDER — ONDANSETRON HCL 4 MG/2ML IJ SOLN
4.0000 mg | Freq: Once | INTRAMUSCULAR | Status: AC
  Administered 2015-08-11: 4 mg via INTRAVENOUS
  Filled 2015-08-11: qty 2

## 2015-08-11 MED ORDER — SODIUM CHLORIDE 0.9 % IV BOLUS (SEPSIS)
2000.0000 mL | Freq: Once | INTRAVENOUS | Status: AC
  Administered 2015-08-11: 2000 mL via INTRAVENOUS

## 2015-08-11 MED ORDER — KETOROLAC TROMETHAMINE 30 MG/ML IJ SOLN
30.0000 mg | Freq: Once | INTRAMUSCULAR | Status: AC
  Administered 2015-08-11: 30 mg via INTRAVENOUS
  Filled 2015-08-11: qty 1

## 2015-08-11 MED ORDER — ONDANSETRON 4 MG PO TBDP: ORAL_TABLET | ORAL | Status: DC

## 2015-08-11 NOTE — ED Notes (Signed)
She c/o 2 day history of abd pain, sweats, n/v/d. Onset of symptoms 30 minutes after eating pizza. She was seen here Friday for this complaint and discharged with phenergan which is not relieving her nausea and she states she can not tolerate oral intake.  She denies any urinary complaints.

## 2015-08-11 NOTE — Discharge Instructions (Signed)
Drink plenty of fluids take Tylenol for fever and aches and follow-up with your doctor if not improving. He continues some Imodium if necessary for diarrhea

## 2015-08-11 NOTE — ED Notes (Signed)
Patient states that she had just went to the bathroom, and is unable to give urine sample at this time.

## 2015-08-11 NOTE — ED Notes (Signed)
Pt approached nurse first/tech first asking if her name had been called. Pt is now located in "waiting to be triaged seats."

## 2015-08-11 NOTE — ED Notes (Signed)
Pt called by Laban Emperor, EMT for room placement with no answer.
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
## 2015-08-12 LAB — URINE CULTURE

## 2015-09-25 ENCOUNTER — Emergency Department (HOSPITAL_COMMUNITY)
Admission: EM | Admit: 2015-09-25 | Discharge: 2015-09-25 | Disposition: A | Discharge status: Home / Self Care | Payer: Self-pay | Attending: Emergency Medicine | Admitting: Emergency Medicine

## 2015-09-25 ENCOUNTER — Encounter (HOSPITAL_COMMUNITY): Payer: Self-pay | Admitting: *Deleted

## 2015-09-25 DIAGNOSIS — F419 Anxiety disorder, unspecified: Secondary | ICD-10-CM | POA: Insufficient documentation

## 2015-09-25 DIAGNOSIS — G40909 Epilepsy, unspecified, not intractable, without status epilepticus: Secondary | ICD-10-CM | POA: Insufficient documentation

## 2015-09-25 DIAGNOSIS — Z3202 Encounter for pregnancy test, result negative: Secondary | ICD-10-CM | POA: Insufficient documentation

## 2015-09-25 DIAGNOSIS — Z791 Long term (current) use of non-steroidal anti-inflammatories (NSAID): Secondary | ICD-10-CM | POA: Insufficient documentation

## 2015-09-25 DIAGNOSIS — F17210 Nicotine dependence, cigarettes, uncomplicated: Secondary | ICD-10-CM | POA: Insufficient documentation

## 2015-09-25 DIAGNOSIS — M545 Low back pain: Secondary | ICD-10-CM | POA: Insufficient documentation

## 2015-09-25 DIAGNOSIS — F329 Major depressive disorder, single episode, unspecified: Secondary | ICD-10-CM | POA: Insufficient documentation

## 2015-09-25 DIAGNOSIS — Z79899 Other long term (current) drug therapy: Secondary | ICD-10-CM | POA: Insufficient documentation

## 2015-09-25 LAB — URINALYSIS, ROUTINE W REFLEX MICROSCOPIC
BILIRUBIN URINE: NEGATIVE
Glucose, UA: NEGATIVE mg/dL
Hgb urine dipstick: NEGATIVE
KETONES UR: NEGATIVE mg/dL
Leukocytes, UA: NEGATIVE
NITRITE: NEGATIVE
PH: 6 (ref 5.0–8.0)
Protein, ur: NEGATIVE mg/dL
Specific Gravity, Urine: 1.037 — ABNORMAL HIGH (ref 1.005–1.030)

## 2015-09-25 LAB — CBC WITH DIFFERENTIAL/PLATELET
BASOS ABS: 0 10*3/uL (ref 0.0–0.1)
BASOS PCT: 0 %
EOS PCT: 3 %
Eosinophils Absolute: 0.3 10*3/uL (ref 0.0–0.7)
HEMATOCRIT: 39.4 % (ref 36.0–46.0)
HEMOGLOBIN: 12.9 g/dL (ref 12.0–15.0)
LYMPHS PCT: 30 %
Lymphs Abs: 2.9 10*3/uL (ref 0.7–4.0)
MCH: 29.4 pg (ref 26.0–34.0)
MCHC: 32.7 g/dL (ref 30.0–36.0)
MCV: 89.7 fL (ref 78.0–100.0)
MONO ABS: 0.8 10*3/uL (ref 0.1–1.0)
MONOS PCT: 8 %
Neutro Abs: 5.8 10*3/uL (ref 1.7–7.7)
Neutrophils Relative %: 59 %
Platelets: 396 10*3/uL (ref 150–400)
RBC: 4.39 MIL/uL (ref 3.87–5.11)
RDW: 14.4 % (ref 11.5–15.5)
WBC: 9.9 10*3/uL (ref 4.0–10.5)

## 2015-09-25 LAB — BASIC METABOLIC PANEL WITH GFR
Anion gap: 8 (ref 5–15)
BUN: 16 mg/dL (ref 6–20)
CO2: 27 mmol/L (ref 22–32)
Calcium: 9.1 mg/dL (ref 8.9–10.3)
Chloride: 105 mmol/L (ref 101–111)
Creatinine, Ser: 0.68 mg/dL (ref 0.44–1.00)
GFR calc Af Amer: >60 mL/min
GFR calc non Af Amer: >60 mL/min
Glucose, Bld: 94 mg/dL (ref 65–99)
Potassium: 3.7 mmol/L (ref 3.5–5.1)
Sodium: 140 mmol/L (ref 135–145)

## 2015-09-25 LAB — VALPROIC ACID LEVEL: Valproic Acid Lvl: 11 ug/mL — ABNORMAL LOW (ref 50.0–100.0)

## 2015-09-25 LAB — I-STAT BETA HCG BLOOD, ED (MC, WL, AP ONLY)

## 2015-09-25 LAB — PHENYTOIN LEVEL, TOTAL: Phenytoin Lvl: <2.5 ug/mL — ABNORMAL LOW (ref 10.0–20.0)

## 2015-09-25 MED ORDER — DIVALPROEX SODIUM 250 MG PO DR TAB
250.0000 mg | DELAYED_RELEASE_TABLET | Freq: Once | ORAL | Status: AC
  Administered 2015-09-25: 250 mg via ORAL
  Filled 2015-09-25: qty 1

## 2015-09-25 MED ORDER — SODIUM CHLORIDE 0.9 % IV SOLN
1500.0000 mg | Freq: Once | INTRAVENOUS | Status: AC
  Administered 2015-09-25: 1500 mg via INTRAVENOUS
  Filled 2015-09-25: qty 30

## 2015-09-25 MED ORDER — DIVALPROEX SODIUM 250 MG PO DR TAB: 250.0000 mg | DELAYED_RELEASE_TABLET | Freq: Two times a day (BID) | ORAL | Status: DC

## 2015-09-25 MED ORDER — PHENYTOIN SODIUM EXTENDED 100 MG PO CAPS: 400.0000 mg | ORAL_CAPSULE | Freq: Every day | ORAL | Status: DC

## 2015-09-25 MED ORDER — IBUPROFEN 800 MG PO TABS
800.0000 mg | ORAL_TABLET | Freq: Once | ORAL | Status: AC
  Administered 2015-09-25: 800 mg via ORAL
  Filled 2015-09-25: qty 1

## 2015-09-25 NOTE — ED Notes (Signed)
Pt left with all her belongings and ambulated out of the treatment area.  

## 2015-09-25 NOTE — ED Notes (Addendum)
Pt arrived by [facility] for seizures today. Hx of seizures but has been off dilantin x 1 month. Reports recent urinary incontinence and lower back pain. On ems arrival, pt was a&ox4.

## 2015-09-25 NOTE — Progress Notes (Signed)
MEDICATION RELATED CONSULT NOTE - INITIAL   Pharmacy Consult for phenytoin loading Indication: seizures  Allergies  Allergen Reactions  . Sulfa Antibiotics Other (See Comments)    Childhood reaction.Unknown     Patient Measurements:   Adjusted Body Weight: 67.5kg  Vital Signs: Temp: 98.5 F (36.9 C) (04/12 1415) Temp Source: Oral (04/12 1415) BP: 102/67 mmHg (04/12 1648) Pulse Rate: 63 (04/12 1648) Intake/Output from previous day:   Intake/Output from this shift:    Labs:  Recent Labs  09/25/15 1849  WBC 9.9  HGB 12.9  HCT 39.4  PLT 396  CREATININE 0.68   CrCl cannot be calculated (Unknown ideal weight.).   Microbiology: No results found for this or any previous visit (from the past 720 hour(s)).  Medical History: Past Medical History  Diagnosis Date  . Seizures (HCC)   . PTSD (post-traumatic stress disorder)   . Depression   . Anxiety   . Chronic diarrhea   . Collapsed lung   . Carpal tunnel syndrome     Assessment: 29 yof who stopped taking her phenytoin presented to the ED with seizure activity. Her phenytoin level is undetectable. Appears patient has some compliance issue as she has had multiple undetectable level.   Goal of Therapy:  Phenytoin Level 10-20  Plan:  - Phenytoin 1500mg  IV x 1 - F/u plans for maintenance dose *Pharmacy will sign off as we were only consulted for a loading dose. Please re-consult if needed. Thank you!  Morghan Kester, Drake LeachRachel Lynn 09/25/2015,8:01 PM

## 2015-09-25 NOTE — Discharge Instructions (Signed)
Is very important to take all medication as directed. Please follow-up with the primary physician in the next 3 days. Return to ER for new or worsening symptoms, any additional concerns.

## 2015-09-25 NOTE — ED Provider Notes (Signed)
CSN: [id number]     Arrival date & time 09/25/15  1410 History   First MD Initiated Contact with Patient 09/25/15 1719     Chief Complaint  Patient presents with  . Seizures    (Consider location/radiation/quality/duration/timing/severity/associated sxs/prior Treatment) Patient is a 30 y.o. female presenting with seizures. The history is provided by the patient and medical records. No language interpreter was used.  Seizures  Patricia Drake is a 29 y.o. female  with a PMH of seizures, PTSD, anxiety/depression who presents to the Emergency Department after having a seizure at approximately 1 PM today (4 hours prior to initial examination). Patient states seizure was witnessed by her boyfriend, who stated she was "out of it for about 10 minutes and shaking." Patient admits to urinary incontinence with this episode. Boyfriend called EMS who brought patient in to the emergency department. At the time of EMS arrival, patient was alert and oriented. Associated symptoms include fatigue which is common for her after seizures. Patient does have a history of seizures, she is supposed be taking episode and Dilantin. She states that she missed several doses of her medication due to financial reasons, but also missed a few doses recently because she was in jail for almost 2 days and they would not give her her medicine.   Patient is also complaining of right-sided back pain along with associated urinary frequency. She denies abdominal pain, dysuria, vaginal discharge.  Past Medical History  Diagnosis Date  . Seizures (HCC)   . PTSD (post-traumatic stress disorder)   . Depression   . Anxiety   . Chronic diarrhea   . Collapsed lung   . Carpal tunnel syndrome    Past Surgical History  Procedure Laterality Date  . Cesarean section    . Tubal ligation     History reviewed. No pertinent family history. Social History  Substance Use Topics  . Smoking status: Current Every Day Smoker    Types:  Cigarettes  . Smokeless tobacco: Never Used  . Alcohol Use: No   OB History    No data available     Review of Systems  Constitutional: Positive for fatigue. Negative for fever and chills.  HENT: Negative for congestion and sore throat.   Eyes: Negative for visual disturbance.  Respiratory: Negative for cough and shortness of breath.   Cardiovascular: Negative.   Gastrointestinal: Negative for nausea, vomiting and abdominal pain.  Genitourinary: Positive for frequency. Negative for dysuria, vaginal discharge and vaginal pain.  Musculoskeletal: Positive for back pain.  Skin: Negative for rash.  Neurological: Positive for seizures.      Allergies  Sulfa antibiotics  Home Medications   Prior to Admission medications   Medication Sig Start Date End Date Taking? Authorizing Provider  albuterol (PROVENTIL HFA;VENTOLIN HFA) 108 (90 Base) MCG/ACT inhaler Inhale 2 puffs into the lungs every 6 (six) hours as needed for wheezing or shortness of breath. 08/08/15  Yes Barbaraann Barthel, MD  hydrOXYzine (ATARAX/VISTARIL) 25 MG tablet Take 1 tablet (25 mg total) by mouth every 6 (six) hours as needed for anxiety. 06/26/15  Yes Beau Fanny, FNP  naproxen (NAPROSYN) 500 MG tablet Take 1 tablet (500 mg total) by mouth 2 (two) times daily with a meal. Patient taking differently: Take 500 mg by mouth 2 (two) times daily as needed for moderate pain.  08/08/15  Yes Barbaraann Barthel, MD  sertraline (ZOLOFT) 100 MG tablet Take 100 mg by mouth daily.   Yes Historical Provider, MD  traZODone (DESYREL) 150 MG tablet Take 150 mg by mouth at bedtime.   Yes Historical Provider, MD  dicyclomine (BENTYL) 20 MG tablet Take one every 6 hours if needed for abdominal cramps 08/11/15   Bethann BerkshireJoseph Zammit, MD  diphenoxylate-atropine (LOMOTIL) 2.5-0.025 MG tablet Take 2 tablets by mouth 4 (four) times daily as needed for diarrhea or loose stools. 08/10/15   Gilda Creasehristopher J Pollina, MD  divalproex (DEPAKOTE) 250 MG DR tablet Take 1  tablet (250 mg total) by mouth 2 (two) times daily. 09/25/15   Chase PicketJaime Pilcher Ward, PA-C  Elastic Bandages & Supports (WRIST SPLINT/LEFT MEDIUM) MISC 1 Units by Does not apply route once. 08/08/15   Barbaraann BarthelJames O Breen, MD  ondansetron (ZOFRAN ODT) 4 MG disintegrating tablet 4mg  ODT q4 hours prn nausea/vomit 08/11/15   Bethann BerkshireJoseph Zammit, MD  phenytoin (DILANTIN) 100 MG ER capsule Take 4 capsules (400 mg total) by mouth at bedtime. 09/25/15   Jaime Pilcher Ward, PA-C  promethazine (PHENERGAN) 25 MG tablet Take 1 tablet (25 mg total) by mouth every 6 (six) hours as needed for nausea or vomiting. 08/10/15   Gilda Creasehristopher J Pollina, MD   BP 102/67 mmHg  Pulse 63  Temp(Src) 98.5 F (36.9 C) (Oral)  Resp 18  SpO2 100%  LMP 09/18/2015 Physical Exam  Constitutional: She is oriented to person, place, and time. She appears well-developed and well-nourished.  Alert and in no acute distress.  HENT:  Head: Normocephalic and atraumatic.  No evidence of tongue biting  Neck: Normal range of motion. Neck supple.  No meningeal signs.  Cardiovascular: Normal rate, regular rhythm, normal heart sounds and intact distal pulses.  Exam reveals no gallop and no friction rub.   No murmur heard. Intact and equal pulses in all four extremities  Pulmonary/Chest: Effort normal and breath sounds normal. No respiratory distress. She has no wheezes. She has no rales. She exhibits no tenderness.  Abdominal: Soft. Bowel sounds are normal. She exhibits no distension and no mass. There is no tenderness. There is no rebound and no guarding.  Musculoskeletal: She exhibits no edema.  Neurological: She is alert and oriented to person, place, and time.  Alert, oriented, thought content appropriate, able to give a coherent history. Speech is clear and goal oriented, able to follow commands.  Cranial Nerves:  II:  Peripheral visual fields grossly normal, pupils equal, round, reactive to light III, IV, VI: EOM intact bilaterally, ptosis not  present V,VII: smile symmetric, eyes kept closed tightly against resistance, facial light touch sensation equal VIII: hearing grossly normal IX, X: symmetric soft palate movement, uvula elevates symmetrically  XI: bilateral shoulder shrug symmetric and strong XII: midline tongue extension 5/5 muscle strength in upper and lower extremities bilaterally including strong and equal grip strength and dorsiflexion/plantar flexion Sensory to light touch normal in all four extremities.  Normal finger-to-nose and rapid alternating movements.   Skin: Skin is warm and dry. No pallor.  Psychiatric: She has a normal mood and affect. Her behavior is normal. Judgment and thought content normal.  Nursing note and vitals reviewed.   ED Course  Procedures (including critical care time) Labs Review Labs Reviewed  PHENYTOIN LEVEL, TOTAL - Abnormal; Notable for the following:    Phenytoin Lvl <2.5 (*)    All other components within normal limits  URINALYSIS, ROUTINE W REFLEX MICROSCOPIC (NOT [facility]) - Abnormal; Notable for the following:    APPearance CLOUDY (*)    Specific Gravity, Urine 1.037 (*)    All other components  within normal limits  VALPROIC ACID LEVEL - Abnormal; Notable for the following:    Valproic Acid Lvl 11 (*)    All other components within normal limits  CBC WITH DIFFERENTIAL/PLATELET  BASIC METABOLIC PANEL  I-STAT BETA HCG BLOOD, ED ([facility], [facility], [facility] ONLY)    Imaging Review No results found. I have personally reviewed and evaluated these images and lab results as part of my medical decision-making.   EKG Interpretation None      MDM   Final diagnoses:  Seizure disorder (HCC)   Patricia Drake is a 30 y.o. female who presents for seizure like activity just prior to arrival. Exam with no focal neuro deficits. She has a history of seizures and is on Depakote and Dilantin, however has been noncompliant with medication. Both med levels were low. Hcg, CBC, BMP also obtained,  reviewed, and negative. Patient was also complaining of right-sided back pain and urinary frequency, therefore UA was obtained which showed no signs of infection. No red flag symptoms of back pain, no dysuria or vaginal symptoms. Loading dose of dilantin per pharmacy and home Depakote was given. Patient with no seizure activity while in my care. Significant amount of time was spent discussing the importance of taking medications daily as directed and the importance of PCP follow up. Return precautions were given and all questions answered.   Patient discussed with Dr. Clayborne Dana who agrees with treatment plan.   [facility] Ward, PA-C 09/25/15 2110  Marily Memos, MD 09/28/15 1239

## 2015-10-27 ENCOUNTER — Inpatient Hospital Stay (HOSPITAL_COMMUNITY)
Admission: EM | Admit: 2015-10-27 | Discharge: 2015-10-30 | DRG: 918 | Disposition: A | Discharge status: Other Acute Inpatient Hospital | Payer: Self-pay | Attending: Internal Medicine | Admitting: Internal Medicine

## 2015-10-27 ENCOUNTER — Emergency Department (HOSPITAL_BASED_OUTPATIENT_CLINIC_OR_DEPARTMENT_OTHER): Payer: Self-pay

## 2015-10-27 ENCOUNTER — Emergency Department (HOSPITAL_BASED_OUTPATIENT_CLINIC_OR_DEPARTMENT_OTHER)
Admission: EM | Admit: 2015-10-27 | Discharge: 2015-10-27 | Disposition: A | Discharge status: Home / Self Care | Payer: Self-pay | Attending: Emergency Medicine | Admitting: Emergency Medicine

## 2015-10-27 ENCOUNTER — Encounter (HOSPITAL_BASED_OUTPATIENT_CLINIC_OR_DEPARTMENT_OTHER): Payer: Self-pay | Admitting: *Deleted

## 2015-10-27 DIAGNOSIS — F1910 Other psychoactive substance abuse, uncomplicated: Secondary | ICD-10-CM | POA: Diagnosis present

## 2015-10-27 DIAGNOSIS — M25532 Pain in left wrist: Secondary | ICD-10-CM | POA: Insufficient documentation

## 2015-10-27 DIAGNOSIS — Z87891 Personal history of nicotine dependence: Secondary | ICD-10-CM | POA: Insufficient documentation

## 2015-10-27 DIAGNOSIS — T50902A Poisoning by unspecified drugs, medicaments and biological substances, intentional self-harm, initial encounter: Secondary | ICD-10-CM | POA: Diagnosis present

## 2015-10-27 DIAGNOSIS — M79632 Pain in left forearm: Secondary | ICD-10-CM | POA: Insufficient documentation

## 2015-10-27 DIAGNOSIS — G40909 Epilepsy, unspecified, not intractable, without status epilepticus: Secondary | ICD-10-CM

## 2015-10-27 DIAGNOSIS — Y929 Unspecified place or not applicable: Secondary | ICD-10-CM | POA: Insufficient documentation

## 2015-10-27 DIAGNOSIS — F329 Major depressive disorder, single episode, unspecified: Secondary | ICD-10-CM | POA: Insufficient documentation

## 2015-10-27 DIAGNOSIS — Y939 Activity, unspecified: Secondary | ICD-10-CM | POA: Insufficient documentation

## 2015-10-27 DIAGNOSIS — Z79899 Other long term (current) drug therapy: Secondary | ICD-10-CM

## 2015-10-27 DIAGNOSIS — Z9114 Patient's other noncompliance with medication regimen: Secondary | ICD-10-CM

## 2015-10-27 DIAGNOSIS — R0789 Other chest pain: Secondary | ICD-10-CM | POA: Diagnosis present

## 2015-10-27 DIAGNOSIS — I959 Hypotension, unspecified: Secondary | ICD-10-CM | POA: Diagnosis present

## 2015-10-27 DIAGNOSIS — D72829 Elevated white blood cell count, unspecified: Secondary | ICD-10-CM

## 2015-10-27 DIAGNOSIS — F4310 Post-traumatic stress disorder, unspecified: Secondary | ICD-10-CM | POA: Diagnosis present

## 2015-10-27 DIAGNOSIS — Y999 Unspecified external cause status: Secondary | ICD-10-CM | POA: Insufficient documentation

## 2015-10-27 DIAGNOSIS — R072 Precordial pain: Secondary | ICD-10-CM

## 2015-10-27 DIAGNOSIS — T43212A Poisoning by selective serotonin and norepinephrine reuptake inhibitors, intentional self-harm, initial encounter: Principal | ICD-10-CM | POA: Diagnosis present

## 2015-10-27 DIAGNOSIS — S0083XA Contusion of other part of head, initial encounter: Secondary | ICD-10-CM | POA: Insufficient documentation

## 2015-10-27 DIAGNOSIS — I952 Hypotension due to drugs: Secondary | ICD-10-CM | POA: Insufficient documentation

## 2015-10-27 DIAGNOSIS — T50901A Poisoning by unspecified drugs, medicaments and biological substances, accidental (unintentional), initial encounter: Secondary | ICD-10-CM | POA: Diagnosis present

## 2015-10-27 DIAGNOSIS — Z23 Encounter for immunization: Secondary | ICD-10-CM | POA: Insufficient documentation

## 2015-10-27 DIAGNOSIS — I4581 Long QT syndrome: Secondary | ICD-10-CM | POA: Diagnosis present

## 2015-10-27 DIAGNOSIS — H538 Other visual disturbances: Secondary | ICD-10-CM | POA: Insufficient documentation

## 2015-10-27 DIAGNOSIS — Z882 Allergy status to sulfonamides status: Secondary | ICD-10-CM

## 2015-10-27 LAB — ETHANOL

## 2015-10-27 LAB — CBC WITH DIFFERENTIAL/PLATELET
BASOS ABS: 0 10*3/uL (ref 0.0–0.1)
Basophils Relative: 0 %
EOS PCT: 1 %
Eosinophils Absolute: 0.2 10*3/uL (ref 0.0–0.7)
HCT: 35.9 % — ABNORMAL LOW (ref 36.0–46.0)
Hemoglobin: 12.6 g/dL (ref 12.0–15.0)
LYMPHS ABS: 2.1 10*3/uL (ref 0.7–4.0)
LYMPHS PCT: 18 %
MCH: 30.5 pg (ref 26.0–34.0)
MCHC: 35.1 g/dL (ref 30.0–36.0)
MCV: 86.9 fL (ref 78.0–100.0)
MONO ABS: 0.7 10*3/uL (ref 0.1–1.0)
Monocytes Relative: 6 %
NEUTROS PCT: 75 %
Neutro Abs: 8.6 10*3/uL — ABNORMAL HIGH (ref 1.7–7.7)
PLATELETS: 369 10*3/uL (ref 150–400)
RBC: 4.13 MIL/uL (ref 3.87–5.11)
RDW: 13.3 % (ref 11.5–15.5)
WBC: 11.6 10*3/uL — AB (ref 4.0–10.5)

## 2015-10-27 LAB — URINE MICROSCOPIC-ADD ON
Bacteria, UA: NONE SEEN
RBC / HPF: NONE SEEN RBC/hpf (ref 0–5)

## 2015-10-27 LAB — URINALYSIS, ROUTINE W REFLEX MICROSCOPIC
BILIRUBIN URINE: NEGATIVE
Glucose, UA: NEGATIVE mg/dL
Hgb urine dipstick: NEGATIVE
KETONES UR: NEGATIVE mg/dL
NITRITE: NEGATIVE
PROTEIN: NEGATIVE mg/dL
Specific Gravity, Urine: 1.011 (ref 1.005–1.030)
pH: 6 (ref 5.0–8.0)

## 2015-10-27 LAB — COMPREHENSIVE METABOLIC PANEL
ALK PHOS: 62 U/L (ref 38–126)
ALT: 13 U/L — AB (ref 14–54)
AST: 19 U/L (ref 15–41)
Albumin: 4.1 g/dL (ref 3.5–5.0)
Anion gap: 7 (ref 5–15)
BUN: 15 mg/dL (ref 6–20)
CALCIUM: 9.1 mg/dL (ref 8.9–10.3)
CHLORIDE: 106 mmol/L (ref 101–111)
CO2: 24 mmol/L (ref 22–32)
CREATININE: 0.76 mg/dL (ref 0.44–1.00)
GFR calc Af Amer: >60 mL/min (ref >60)
Glucose, Bld: 87 mg/dL (ref 65–99)
Potassium: 4.1 mmol/L (ref 3.5–5.1)
Sodium: 137 mmol/L (ref 135–145)
Total Bilirubin: 0.6 mg/dL (ref 0.3–1.2)
Total Protein: 7.4 g/dL (ref 6.5–8.1)

## 2015-10-27 LAB — RAPID URINE DRUG SCREEN, HOSP PERFORMED
Amphetamines: POSITIVE — AB
BENZODIAZEPINES: NOT DETECTED
Barbiturates: NOT DETECTED
Cocaine: NOT DETECTED
Opiates: NOT DETECTED
Tetrahydrocannabinol: POSITIVE — AB

## 2015-10-27 LAB — SALICYLATE LEVEL

## 2015-10-27 LAB — POC URINE PREG, ED: Preg Test, Ur: NEGATIVE

## 2015-10-27 LAB — ACETAMINOPHEN LEVEL: ACETAMINOPHEN (TYLENOL), SERUM: 12 ug/mL (ref 10–30)

## 2015-10-27 LAB — PREGNANCY, URINE: PREG TEST UR: NEGATIVE

## 2015-10-27 MED ORDER — SODIUM CHLORIDE 0.9 % IV BOLUS (SEPSIS)
1000.0000 mL | Freq: Once | INTRAVENOUS | Status: AC
  Administered 2015-10-27: 1000 mL via INTRAVENOUS

## 2015-10-27 MED ORDER — IBUPROFEN 800 MG PO TABS
800.0000 mg | ORAL_TABLET | Freq: Once | ORAL | Status: AC
  Administered 2015-10-27: 800 mg via ORAL
  Filled 2015-10-27: qty 1

## 2015-10-27 MED ORDER — ACETAMINOPHEN 500 MG PO TABS
1000.0000 mg | ORAL_TABLET | Freq: Once | ORAL | Status: AC
  Administered 2015-10-27: 1000 mg via ORAL
  Filled 2015-10-27: qty 2

## 2015-10-27 MED ORDER — TETANUS-DIPHTH-ACELL PERTUSSIS 5-2.5-18.5 LF-MCG/0.5 IM SUSP
0.5000 mL | Freq: Once | INTRAMUSCULAR | Status: AC
  Administered 2015-10-27: 0.5 mL via INTRAMUSCULAR
  Filled 2015-10-27: qty 0.5

## 2015-10-27 MED ORDER — OXYCODONE HCL 5 MG PO TABS
5.0000 mg | ORAL_TABLET | Freq: Once | ORAL | Status: AC
  Administered 2015-10-27: 5 mg via ORAL
  Filled 2015-10-27: qty 1
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
## 2015-10-27 NOTE — ED Notes (Addendum)
Patient uncooperative and agitated while trying to assess patient, keeps stating "I don't know" answering questions.  This RN will continue to attempt to finish history.

## 2015-10-27 NOTE — ED Notes (Signed)
Bed: WA17 Expected date:  Expected time:  Means of arrival:  Comments: [age] F tramadol overdose

## 2015-10-27 NOTE — Discharge Instructions (Signed)
Wrist Pain There are many things that can cause wrist pain. Some common causes include:  An injury to the wrist area.  Overuse of the joint.  A condition that causes too much pressure to be put on a nerve in the wrist (carpal tunnel syndrome).  Wear and tear of the joints that happens as a person gets older (osteoarthritis).  Other types of arthritis. Sometimes, the cause is not known. The pain often goes away when you follow instructions from your doctor about relieving pain at home. If your wrist pain does not go away, tests may need to be done to find the cause. HOME CARE Pay attention to any changes in your symptoms. Take these actions to help with your pain:  Rest your wrist for at least 48 hours or as told by your doctor.  If your doctor tells you to, put ice on the injured area:  Put ice in a plastic bag.  Place a towel between your skin and the bag.  Leave the ice on for 20 minutes, 2-3 times per day.  Keep your arm raised (elevated) above the level of your heart while you are sitting or lying down.  If a splint or elastic bandage has been put on the injured area:  Wear it as told by your doctor.  Take the splint or bandage off only as told by your doctor.  Loosen the splint or bandage if your fingers lose feeling (are numb) or have a tingling feeling, or if they turn cold or blue.  Take over-the-counter and prescription medicines only as told by your doctor.  Keep all follow-up visits as told by your doctor. This is important. GET HELP IF:  Your pain is not helped by treatment.  Your pain gets worse. GET HELP RIGHT AWAY IF:   Your fingers swell.  Your fingers turn white, very red, or cold and blue.  Your fingers lose feeling or have a tingling feeling.  You have trouble moving your fingers.   This information is not intended to replace advice given to you by your health care provider. Make sure you discuss any questions you have with your health care  provider.   Document Released: 11/18/2007 Document Revised: 02/20/2015 Document Reviewed: 10/17/2014 Elsevier Interactive Patient Education 2016 [employer]. The [employer] Social Services The United Ways 211 is a great source of information about community services available.  Access by dialing 2-1-1 from anywhere in [place], or by website -  PooledIncome.pl.   Other Local Resources (Updated 06/2015)  Social  Contractor Number and Address  Aging, Disability and Transit Services  In-home services  Meals on wheels  Community meal sites  Transportation services  Adult day health care  Center for Active Retirement  Family caregiver support services [phone] [place], [place]  Blessing [employer] of Social Services  Aging and adult services  Childrens services  Deaf-Blind services  Disability services  Guardianship  Hearing loss (assistive technology, interpreters, etc.)  Low-income services (health care, child care, housing, financial and nutrition assistance)  Medicaid  Pregnancy services  Utility assistance  Veterans services [phone] [address] [place], [place] [address]   [facility]  Assessment  Care management  Family education  Information and referral [phone] [address] [place], [place] [address]  [facility] Social Services  Aging and adult services  Childrens services  Deaf-Blind services  Disability services  Guardianship  Hearing loss (assistive technology, interpreters, etc.)  Low-income  services (health care, child care, housing, financial and nutrition assistance)  [facility]  Pregnancy services  Utility assistance  Veterans services [phone] [address] [place], [place] [address]  [facility] Department of Social Services  Aging and adult services  Childrens services  Deaf-Blind services  Disability  services  Guardianship  Hearing loss (assistive technology, interpreters, etc.)  Low-income services (health care, child care, housing, financial and nutrition assistance)  [facility]  Pregnancy services  Utility assistance  Veterans services [phone] [address] [place], [place] [address]  [facility] Department of Health and [employer]  Aging and adult services  Childrens services  Deaf-Blind services  Disability services  Guardianship  Hearing loss (assistive technology, interpreters, etc.)  Low-income services (health care, child care, housing, financial and nutrition assistance)  Medicaid  Pregnancy services  Utility assistance  Veterans services [phone] [address] [place], [place] [address]   [facility] Department of Social Services  Aging and adult services  Childrens services  Deaf-Blind services  Disability services  Guardianship  Hearing loss (assistive technology, interpreters, etc.)  Low-income services (health care, child care, housing, financial and nutrition assistance)  Medicaid  Pregnancy services  Utility assistance  Veterans services [phone] [address] [place], [place] [address]  [facility] Division of Social Services  Aging and adult services  Childrens services  Deaf-Blind services  Disability services  Guardianship  Hearing loss (assistive technology, interpreters, etc.)  Low-income services (health care, child care, housing, financial and nutrition assistance)  Medicaid  Pregnancy services  Utility assistance  Veterans services [phone] 65 [place], [place] [address]  Senior Resources of Haynes Bast  Serves adults age 75 and over and their families  Caregiver information  Foster grandparents  Forensic scientist meals  Refugee programs  Retired Herbalist (RSVP)  Astronomer Information Program [facility])  Administrator, Civil Service Program  SeniorLine  Support Groups  TeleCare [phone]  [address] [place], [place] [address]  [phone]  [address] [place], [place] [address]  Senior [employer].  Help Line  Home Care  Living at [employer] on Wheels  Senior Lunch Program  Adult Day Center  Equipment [facility] Closet  Support Groups  Care Partner Workshops  Referrals for chore and homemaker services [phone]  [address] [place], [place] [address]  The [employer]vation Army  Crisis assistance  Medication assistance  Rental assistance  Food pantry  Medication assistance  Housing assistance  Emergency food distribution  Utility assistance  Boys and Girls Clubs [phone] [address] [place], [place]   [phone] and Thursdays from 9am - 12 noon by appointment only) [address] [place], [place] [address]  [phone] [address] [place], [place] [address]    Community Resource Guide Financial Assistance The United Ways 211 is a great source of information about community services available.  Access by dialing 2-1-1 from anywhere in [place], or by website -  PooledIncome.pl.   Other Local Resources (Updated 06/2015)  Financial Assistance   Services    Phone Number and Address  [facility]  Low-cost medical care - 1st and 3rd Saturday of every month  Must not qualify for public or private insurance and must have limited income [phone] [address] [place], [place]    [place] [employer] of Social Services  Child care  Emergency assistance for housing and [employer]  [phone] [address] [place], [place] [address]   [facility]  Low-cost medical care for children, communicable diseases, sexually-transmitted  diseases, immunizations, maternity care, womens health and family planning [phone] [address] [place], [place] [address]  [facility]   Medication assistance for [place] residents  Must meet income requirements [phone] [address] [place], [place].    [facility] Social Services  Child care  Emergency assistance for housing and Kimberly-Clarkutilities  Food stamps  Medicaid [phone] [address] [place], [place] [address]  [facility]   Low-cost medical care,   Monday through Friday, 9 am to 6 pm.   Accepts Medicare/Medicaid, and self-pay [phone] [address] [place], [place] [address]  [facility]  Low-cost medical care - Monday through Friday, 8:30 am - 5:30 pm  Accepts Medicaid and self-pay [phone] [address] [place], [place] [address]   [facility]  Primary medical care, including for those with sickle cell disease  Accepts Medicare, Medicaid, insurance and self-pay [phone] [address] [place], [place]  [facility]   Primary medical care  Accepts Medicare, [place], insurance and self-pay [phone] [address], [address] [place], [place] [address]   [facility] Department of Social Services  Child care  Emergency assistance for housing and Kimberly-Clarkutilities  Food stamps  Medicaid [phone] [address] [place], [place] [address]  [facility] Department of Health and [employer]Human Services  Child care  Emergency assistance for housing and Kimberly-Clarkutilities  Food stamps  Medicaid [phone] [address] [place], [place] [address]   [facility] Medication [facility]  Medication assistance for [place] residents with no insurance only  Must have a primary care doctor [phone] [address] [place], [place]   [facility]   Primary medical care  RidgelyAccepts Medicare, [place]icaid, insurance  [phone] [place], [place]  MedAssist   Medication assistance [phone]  [facility]   Primary medical care  Accepts Medicare, [place], insurance and self-pay [phone] [address] [place], [place] [address]  [facility]   Primary medical care  Accepts Medicare, [place], insurance and self-pay [phone] [address] [place], [place] [address]  [facility]  For [place] residents between the ages of 4418 and 4064 who do not have any form of health insurance, Medicare, [place]icaid, or [place] benefits.  Services are provided free of charge to uninsured patients who fall within federal poverty guidelines.    Hours: Tuesdays and Thursdays, 4:15 - 8 pm [phone] [address], [address] [place], [place] [address]  [facility]     Primary medical care  Dental care  Nutritional counseling  Pharmacy  Accepts Medicaid, Medicare, most insurance.  Fees are adjusted based on ability to pay.   [phone] [facility] [address] [place], [place]  [phone] [facility] [address] [place], [place]  [phone] [facility] [place], [place]  [phone] [facility], [address] [place], [place]  [phone] [facility] [address] [place], [place]  Planned Parenthood  Womens health and family planning [phone] [address] [place], [place]  [facility] Department of Social Services  Child care  Emergency assistance for housing and [employer] Corporationutilities  Food stamps  Medicaid  [phone] [address], [place], [place] [address]   [facility]    Ages 64 and older  Hours: Mondays and Thursdays, 7:00 am - 9:00 am Patients  are seen on a first come, first served basis. [phone], [phone] [place], [place]  [facility] Division of Social Services  Child care  Emergency assistance for housing and [employer]ly[employer]ark  Medicaid [phone] [address] [place], [place] [address]  The Salvation Army  Medication assistance  Rental assistance  Food pantry  Medication assistance  Housing assistance  Emergency food distribution  Utility assistance [phone] [address] [place], [place]  [phone]  [address] [place], [place] [address] Hours: Tuesdays and Thursdays from 9am - 12 noon by appointment only  [phone] [address] [place], [place] [address]  [facility] - Lanae Boast   Accepts private insurance, [place], and [place].  Payment is based on a sliding scale for those without insurance.  Hours: Mondays, Tuesdays and Thursdays, 8:30 am - 5:30 pm.   [phone] [address] [place] [place], [place]  [facility] - Family Medicine at [facility], [place], and [place].  Payment is based on a sliding scale for those without insurance. [phone] [address] [place], [place]  [facility] - Pediatrics at E. Scientist, research (physical sciences), [employer], and [place].  Payment is based on a sliding scale for those without insurance [phone] [address], [employer], [place]  [facility] - Pediatrics at [employer], [place], and [place].  Payment is based on a sliding scale for those without insurance. [phone] [address] [place], [place]  [facility] - Pediatrics at [facility], [place], and [place].  Payment is based on a sliding scale for those without insurance. [phone], [phone] [place], [place].    [facility]  Maternity care.  Accepts Medicaid and self-pay. [phone] [address] [place], [place]
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
## 2015-10-27 NOTE — ED Notes (Signed)
Patient pulled IV access.  Notified MD.

## 2015-10-28 ENCOUNTER — Encounter (HOSPITAL_COMMUNITY): Payer: Self-pay | Admitting: *Deleted

## 2015-10-28 DIAGNOSIS — T43212A Poisoning by selective serotonin and norepinephrine reuptake inhibitors, intentional self-harm, initial encounter: Principal | ICD-10-CM

## 2015-10-28 DIAGNOSIS — T50901A Poisoning by unspecified drugs, medicaments and biological substances, accidental (unintentional), initial encounter: Secondary | ICD-10-CM | POA: Diagnosis present

## 2015-10-28 DIAGNOSIS — T1491 Suicide attempt: Secondary | ICD-10-CM

## 2015-10-28 DIAGNOSIS — R45851 Suicidal ideations: Secondary | ICD-10-CM

## 2015-10-28 DIAGNOSIS — I959 Hypotension, unspecified: Secondary | ICD-10-CM

## 2015-10-28 DIAGNOSIS — I952 Hypotension due to drugs: Secondary | ICD-10-CM | POA: Insufficient documentation

## 2015-10-28 DIAGNOSIS — I4581 Long QT syndrome: Secondary | ICD-10-CM

## 2015-10-28 DIAGNOSIS — T50902A Poisoning by unspecified drugs, medicaments and biological substances, intentional self-harm, initial encounter: Secondary | ICD-10-CM | POA: Diagnosis present

## 2015-10-28 DIAGNOSIS — D72829 Elevated white blood cell count, unspecified: Secondary | ICD-10-CM

## 2015-10-28 DIAGNOSIS — T50902D Poisoning by unspecified drugs, medicaments and biological substances, intentional self-harm, subsequent encounter: Secondary | ICD-10-CM

## 2015-10-28 DIAGNOSIS — F329 Major depressive disorder, single episode, unspecified: Secondary | ICD-10-CM

## 2015-10-28 LAB — COMPREHENSIVE METABOLIC PANEL
ALT: 12 U/L — AB (ref 14–54)
AST: 14 U/L — AB (ref 15–41)
Albumin: 3.8 g/dL (ref 3.5–5.0)
Alkaline Phosphatase: 55 U/L (ref 38–126)
Anion gap: 4 — ABNORMAL LOW (ref 5–15)
BILIRUBIN TOTAL: 0.8 mg/dL (ref 0.3–1.2)
BUN: 10 mg/dL (ref 6–20)
CALCIUM: 8 mg/dL — AB (ref 8.9–10.3)
CHLORIDE: 113 mmol/L — AB (ref 101–111)
CO2: 22 mmol/L (ref 22–32)
CREATININE: 0.61 mg/dL (ref 0.44–1.00)
Glucose, Bld: 92 mg/dL (ref 65–99)
Potassium: 4 mmol/L (ref 3.5–5.1)
Sodium: 139 mmol/L (ref 135–145)
TOTAL PROTEIN: 7.2 g/dL (ref 6.5–8.1)

## 2015-10-28 LAB — BLOOD GAS, ARTERIAL
ACID-BASE DEFICIT: 4.5 mmol/L — AB (ref 0.0–2.0)
BICARBONATE: 19.4 meq/L — AB (ref 20.0–24.0)
Drawn by: 232811
FIO2: 0.21
O2 Saturation: 96.1 %
PATIENT TEMPERATURE: 97.9
PCO2 ART: 33.2 mmHg — AB (ref 35.0–45.0)
PH ART: 7.383 (ref 7.350–7.450)
PO2 ART: 88.6 mmHg (ref 80.0–100.0)
TCO2: 17.7 mmol/L (ref 0–100)

## 2015-10-28 LAB — CBC
HEMATOCRIT: 36.5 % (ref 36.0–46.0)
Hemoglobin: 12.3 g/dL (ref 12.0–15.0)
MCH: 30.1 pg (ref 26.0–34.0)
MCHC: 33.7 g/dL (ref 30.0–36.0)
MCV: 89.5 fL (ref 78.0–100.0)
PLATELETS: 346 10*3/uL (ref 150–400)
RBC: 4.08 MIL/uL (ref 3.87–5.11)
RDW: 13.4 % (ref 11.5–15.5)
WBC: 10.1 10*3/uL (ref 4.0–10.5)

## 2015-10-28 LAB — MAGNESIUM: MAGNESIUM: 1.8 mg/dL (ref 1.7–2.4)

## 2015-10-28 LAB — MRSA PCR SCREENING: MRSA by PCR: NEGATIVE

## 2015-10-28 LAB — ACETAMINOPHEN LEVEL

## 2015-10-28 MED ORDER — SODIUM CHLORIDE 0.9 % IV BOLUS (SEPSIS)
1000.0000 mL | Freq: Once | INTRAVENOUS | Status: AC
  Administered 2015-10-28: 1000 mL via INTRAVENOUS

## 2015-10-28 MED ORDER — IPRATROPIUM BROMIDE 0.02 % IN SOLN
0.5000 mg | Freq: Four times a day (QID) | RESPIRATORY_TRACT | Status: DC
  Administered 2015-10-28: 0.5 mg via RESPIRATORY_TRACT
  Filled 2015-10-28: qty 2.5

## 2015-10-28 MED ORDER — SODIUM CHLORIDE 0.9% FLUSH
3.0000 mL | Freq: Two times a day (BID) | INTRAVENOUS | Status: DC
  Administered 2015-10-28 (×2): 3 mL via INTRAVENOUS
  Administered 2015-10-29: 0.3 mL via INTRAVENOUS
  Administered 2015-10-30: 3 mL via INTRAVENOUS

## 2015-10-28 MED ORDER — ONDANSETRON HCL 4 MG PO TABS
4.0000 mg | ORAL_TABLET | Freq: Four times a day (QID) | ORAL | Status: DC | PRN
Start: 2015-10-28 — End: 2015-10-30

## 2015-10-28 MED ORDER — ALBUTEROL SULFATE (2.5 MG/3ML) 0.083% IN NEBU: 2.5000 mg | INHALATION_SOLUTION | Freq: Four times a day (QID) | RESPIRATORY_TRACT | Status: DC | PRN

## 2015-10-28 MED ORDER — LORAZEPAM 2 MG/ML IJ SOLN: 1.0000 mg | INTRAMUSCULAR | Status: DC | PRN

## 2015-10-28 MED ORDER — ENOXAPARIN SODIUM 40 MG/0.4ML ~~LOC~~ SOLN
40.0000 mg | SUBCUTANEOUS | Status: DC
  Administered 2015-10-28 – 2015-10-29 (×2): 40 mg via SUBCUTANEOUS
  Filled 2015-10-28 (×3): qty 0.4

## 2015-10-28 MED ORDER — POTASSIUM CHLORIDE CRYS ER 20 MEQ PO TBCR
20.0000 meq | EXTENDED_RELEASE_TABLET | Freq: Once | ORAL | Status: AC
  Administered 2015-10-28: 20 meq via ORAL
  Filled 2015-10-28: qty 1

## 2015-10-28 MED ORDER — PHENYLEPHRINE HCL 10 MG/ML IJ SOLN
0.0000 ug/min | INTRAVENOUS | Status: DC
  Filled 2015-10-28: qty 1

## 2015-10-28 MED ORDER — MAGNESIUM SULFATE 2 GM/50ML IV SOLN
2.0000 g | Freq: Once | INTRAVENOUS | Status: AC
  Administered 2015-10-28: 2 g via INTRAVENOUS
  Filled 2015-10-28: qty 50

## 2015-10-28 MED ORDER — ONDANSETRON HCL 4 MG/2ML IJ SOLN
4.0000 mg | Freq: Four times a day (QID) | INTRAMUSCULAR | Status: DC | PRN
Start: 2015-10-28 — End: 2015-10-30

## 2015-10-28 MED ORDER — SODIUM CHLORIDE 0.9 % IV SOLN
INTRAVENOUS | Status: DC
  Administered 2015-10-28 – 2015-10-30 (×5): via INTRAVENOUS
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
## 2015-10-28 NOTE — ED Notes (Signed)
Dr Katrinka BlazingSmith informed of BP and at bedside at this time.

## 2015-10-28 NOTE — Progress Notes (Signed)
   10/28/15 1000  Clinical Encounter Type  Visited With Patient;Other (Comment) Psychiatrist(Sitter)  Visit Type Initial;Psychological support;Spiritual support;Critical Care  Referral From Physician  Consult/Referral To Chaplain  Spiritual Encounters  Spiritual Needs Other (Comment)  Stress Factors  Patient Stress Factors Not reviewed   I attempted to visit with the patient per referral by the Physician stating that the patient requested prayer. The patient was half asleep when I arrived. Her sitter stated that the patient wasn't asleep; but the patient stated that she would like me to come back at another time.   I will follow-up with the patient.   Chaplain Clint BolderBrittany Ally Knodel M.Div.
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
## 2015-10-29 ENCOUNTER — Inpatient Hospital Stay (HOSPITAL_COMMUNITY): Payer: MEDICAID

## 2015-10-29 DIAGNOSIS — R079 Chest pain, unspecified: Secondary | ICD-10-CM

## 2015-10-29 DIAGNOSIS — R072 Precordial pain: Secondary | ICD-10-CM

## 2015-10-29 DIAGNOSIS — T50902A Poisoning by unspecified drugs, medicaments and biological substances, intentional self-harm, initial encounter: Secondary | ICD-10-CM

## 2015-10-29 LAB — CBC
HEMATOCRIT: 31.8 % — AB (ref 36.0–46.0)
Hemoglobin: 10.7 g/dL — ABNORMAL LOW (ref 12.0–15.0)
MCH: 30.8 pg (ref 26.0–34.0)
MCHC: 33.6 g/dL (ref 30.0–36.0)
MCV: 91.6 fL (ref 78.0–100.0)
Platelets: 312 10*3/uL (ref 150–400)
RBC: 3.47 MIL/uL — ABNORMAL LOW (ref 3.87–5.11)
RDW: 14.1 % (ref 11.5–15.5)
WBC: 7.2 10*3/uL (ref 4.0–10.5)

## 2015-10-29 LAB — BASIC METABOLIC PANEL
Anion gap: 3 — ABNORMAL LOW (ref 5–15)
BUN: 9 mg/dL (ref 6–20)
CHLORIDE: 112 mmol/L — AB (ref 101–111)
CO2: 23 mmol/L (ref 22–32)
Calcium: 8 mg/dL — ABNORMAL LOW (ref 8.9–10.3)
Creatinine, Ser: 0.55 mg/dL (ref 0.44–1.00)
GFR calc Af Amer: >60 mL/min (ref >60)
GFR calc non Af Amer: >60 mL/min (ref >60)
GLUCOSE: 97 mg/dL (ref 65–99)
POTASSIUM: 3.9 mmol/L (ref 3.5–5.1)
Sodium: 138 mmol/L (ref 135–145)

## 2015-10-29 LAB — ECHOCARDIOGRAM COMPLETE
HEIGHTINCHES: 62 in
Weight: 3379.21 oz

## 2015-10-29 LAB — TROPONIN I
Troponin I: <0.03 ng/mL (ref <0.031)
Troponin I: <0.03 ng/mL (ref <0.031)

## 2015-10-29 LAB — MAGNESIUM: Magnesium: 1.8 mg/dL (ref 1.7–2.4)

## 2015-10-29 MED ORDER — KETOROLAC TROMETHAMINE 30 MG/ML IJ SOLN
30.0000 mg | Freq: Four times a day (QID) | INTRAMUSCULAR | Status: AC
  Administered 2015-10-29 (×2): 30 mg via INTRAVENOUS
  Filled 2015-10-29 (×2): qty 1

## 2015-10-29 MED ORDER — HYDROCODONE-ACETAMINOPHEN 5-325 MG PO TABS
1.0000 | ORAL_TABLET | Freq: Four times a day (QID) | ORAL | Status: DC | PRN
  Administered 2015-10-29 – 2015-10-30 (×2): 1 via ORAL
  Filled 2015-10-29 (×2): qty 1

## 2015-10-29 NOTE — Progress Notes (Signed)
Patient has been seen by MD and medically cleared.  [facility] and for review for admission. Patient voluntary at this time.  [facility] can [facility] if [facility].   Will follow up in Am if remaining in hospital.  Deretha EmoryHannah Adriana Quinby LCSW, MSW Clinical Social Work: [facility] [employer] [phone]
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
## 2015-10-29 NOTE — Progress Notes (Signed)
  Echocardiogram 2D Echocardiogram has been performed.  Leta JunglingCooper, Warrene Kapfer M 10/29/2015, 2:51 PM

## 2015-10-30 ENCOUNTER — Inpatient Hospital Stay (HOSPITAL_COMMUNITY)
Admission: AD | Admit: 2015-10-30 | Discharge: 2015-11-05 | DRG: 885 | Disposition: A | Discharge status: Home / Self Care | Payer: No Typology Code available for payment source | Source: Intra-hospital | Attending: Psychiatry | Admitting: Psychiatry

## 2015-10-30 ENCOUNTER — Encounter (HOSPITAL_COMMUNITY): Payer: Self-pay

## 2015-10-30 DIAGNOSIS — G4700 Insomnia, unspecified: Secondary | ICD-10-CM | POA: Diagnosis present

## 2015-10-30 DIAGNOSIS — F4310 Post-traumatic stress disorder, unspecified: Secondary | ICD-10-CM | POA: Diagnosis present

## 2015-10-30 DIAGNOSIS — F332 Major depressive disorder, recurrent severe without psychotic features: Principal | ICD-10-CM | POA: Insufficient documentation

## 2015-10-30 DIAGNOSIS — J45909 Unspecified asthma, uncomplicated: Secondary | ICD-10-CM | POA: Diagnosis present

## 2015-10-30 DIAGNOSIS — Z590 Homelessness: Secondary | ICD-10-CM

## 2015-10-30 DIAGNOSIS — F411 Generalized anxiety disorder: Secondary | ICD-10-CM | POA: Diagnosis present

## 2015-10-30 DIAGNOSIS — G40909 Epilepsy, unspecified, not intractable, without status epilepticus: Secondary | ICD-10-CM | POA: Diagnosis present

## 2015-10-30 DIAGNOSIS — F339 Major depressive disorder, recurrent, unspecified: Secondary | ICD-10-CM | POA: Diagnosis present

## 2015-10-30 DIAGNOSIS — Z87891 Personal history of nicotine dependence: Secondary | ICD-10-CM | POA: Diagnosis not present

## 2015-10-30 LAB — LIPID PANEL
Cholesterol: 146 mg/dL (ref 0–200)
HDL: 40 mg/dL — ABNORMAL LOW (ref >40)
LDL Cholesterol: 66 mg/dL (ref 0–99)
Total CHOL/HDL Ratio: 3.7 RATIO
Triglycerides: 200 mg/dL — ABNORMAL HIGH (ref <150)
VLDL: 40 mg/dL (ref 0–40)

## 2015-10-30 LAB — TSH: TSH: 3.661 u[IU]/mL (ref 0.350–4.500)

## 2015-10-30 MED ORDER — ALBUTEROL SULFATE HFA 108 (90 BASE) MCG/ACT IN AERS
2.0000 | INHALATION_SPRAY | Freq: Four times a day (QID) | RESPIRATORY_TRACT | Status: DC | PRN
  Administered 2015-11-01 – 2015-11-03 (×4): 2 via RESPIRATORY_TRACT
  Filled 2015-10-30: qty 6.7

## 2015-10-30 MED ORDER — WRIST SPLINT/LEFT MEDIUM MISC: 1.0000 [IU] | Freq: Once | Status: DC

## 2015-10-30 MED ORDER — ONDANSETRON 4 MG PO TBDP: 4.0000 mg | ORAL_TABLET | Freq: Three times a day (TID) | ORAL | Status: DC | PRN

## 2015-10-30 MED ORDER — MAGNESIUM HYDROXIDE 400 MG/5ML PO SUSP: 30.0000 mL | Freq: Every day | ORAL | Status: DC | PRN

## 2015-10-30 MED ORDER — DICLOFENAC SODIUM 1 % TD GEL: 2.0000 g | Freq: Four times a day (QID) | TRANSDERMAL | Status: DC

## 2015-10-30 MED ORDER — DICLOFENAC SODIUM 1 % TD GEL
2.0000 g | Freq: Four times a day (QID) | TRANSDERMAL | Status: DC
  Filled 2015-10-30: qty 100

## 2015-10-30 MED ORDER — ACETAMINOPHEN 325 MG PO TABS
650.0000 mg | ORAL_TABLET | Freq: Four times a day (QID) | ORAL | Status: DC | PRN
  Administered 2015-10-30 – 2015-10-31 (×2): 650 mg via ORAL
  Filled 2015-10-30 (×3): qty 2

## 2015-10-30 MED ORDER — SERTRALINE HCL 100 MG PO TABS
100.0000 mg | ORAL_TABLET | Freq: Every day | ORAL | Status: DC
  Administered 2015-10-30 – 2015-10-31 (×2): 100 mg via ORAL
  Filled 2015-10-30 (×3): qty 1

## 2015-10-30 MED ORDER — HYDROXYZINE HCL 25 MG PO TABS
25.0000 mg | ORAL_TABLET | Freq: Four times a day (QID) | ORAL | Status: DC | PRN
  Administered 2015-10-30: 25 mg via ORAL
  Filled 2015-10-30: qty 1

## 2015-10-30 MED ORDER — NICOTINE 21 MG/24HR TD PT24
21.0000 mg | MEDICATED_PATCH | Freq: Every day | TRANSDERMAL | Status: DC
  Filled 2015-10-30 (×6): qty 1

## 2015-10-30 MED ORDER — PNEUMOCOCCAL VAC POLYVALENT 25 MCG/0.5ML IJ INJ
0.5000 mL | INJECTION | INTRAMUSCULAR | Status: AC
  Administered 2015-10-31: 0.5 mL via INTRAMUSCULAR

## 2015-10-30 MED ORDER — TRAZODONE HCL 100 MG PO TABS
100.0000 mg | ORAL_TABLET | Freq: Every day | ORAL | Status: DC
  Administered 2015-10-30: 100 mg via ORAL
  Filled 2015-10-30 (×2): qty 1

## 2015-10-30 MED ORDER — DICLOFENAC SODIUM 1 % TD GEL
2.0000 g | Freq: Four times a day (QID) | TRANSDERMAL | Status: DC
  Administered 2015-10-30 – 2015-11-05 (×13): 2 g via TOPICAL
  Filled 2015-10-30: qty 100

## 2015-10-30 MED ORDER — NAPROXEN 500 MG PO TABS
500.0000 mg | ORAL_TABLET | Freq: Two times a day (BID) | ORAL | Status: DC
  Administered 2015-10-30 – 2015-11-05 (×12): 500 mg via ORAL
  Filled 2015-10-30 (×15): qty 1

## 2015-10-30 MED ORDER — ALUM & MAG HYDROXIDE-SIMETH 200-200-20 MG/5ML PO SUSP: 30.0000 mL | ORAL | Status: DC | PRN
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
## 2015-10-30 NOTE — Progress Notes (Signed)
[facility] Bed: 406-1 Attending: Dr. Adela Glimpseabos Report: [phone] Transport:  [place]   Patient is voluntary at this time. Will complete paperwork with patient and fax to behavioral health. Can admit before 1pm or after 1pm, will discuss with RN.  No other needs at this time.  DC order and summary already completed.  Deretha EmoryHannah Robie Oats LCSW, MSW Clinical Social Work: [employer] [phone]

## 2015-10-31 DIAGNOSIS — F332 Major depressive disorder, recurrent severe without psychotic features: Principal | ICD-10-CM

## 2015-10-31 LAB — HEMOGLOBIN A1C
HEMOGLOBIN A1C: 5.6 % (ref 4.8–5.6)
MEAN PLASMA GLUCOSE: 114 mg/dL

## 2015-10-31 MED ORDER — PHENYTOIN SODIUM EXTENDED 100 MG PO CAPS
100.0000 mg | ORAL_CAPSULE | Freq: Two times a day (BID) | ORAL | Status: DC
  Administered 2015-10-31 – 2015-11-05 (×10): 100 mg via ORAL
  Filled 2015-10-31 (×13): qty 1

## 2015-10-31 MED ORDER — VENLAFAXINE HCL ER 75 MG PO CP24
75.0000 mg | ORAL_CAPSULE | Freq: Every day | ORAL | Status: DC
  Administered 2015-11-01 – 2015-11-04 (×4): 75 mg via ORAL
  Filled 2015-10-31 (×6): qty 1

## 2015-10-31 MED ORDER — DIVALPROEX SODIUM ER 250 MG PO TB24
250.0000 mg | ORAL_TABLET | Freq: Every day | ORAL | Status: DC
  Administered 2015-10-31: 250 mg via ORAL
  Filled 2015-10-31 (×4): qty 1

## 2015-10-31 MED ORDER — HYDROXYZINE HCL 25 MG PO TABS
25.0000 mg | ORAL_TABLET | Freq: Four times a day (QID) | ORAL | Status: DC | PRN
  Administered 2015-10-31 – 2015-11-04 (×10): 25 mg via ORAL
  Filled 2015-10-31 (×4): qty 1
  Filled 2015-10-31: qty 10
  Filled 2015-10-31 (×6): qty 1
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
## 2015-10-31 NOTE — Tx Team (Signed)
Interdisciplinary Treatment Plan Update (Adult) Date: 10/31/2015   Date: 10/31/2015 1:29 PM  Progress in Treatment:  Attending groups: Pt is new to milieu, continuing to assess   Participating in groups: Pt is new to milieu, continuing to assess   Taking medication as prescribed: Yes  Tolerating medication: Yes  Family/Significant othe contact made: No, Pt declines Patient understands diagnosis: Yes AEB seeking help with depression Discussing patient identified problems/goals with staff: Yes  Medical problems stabilized or resolved: Yes  Denies suicidal/homicidal ideation: No, recently admitted after an overdose and endorsing SI Patient has not harmed self or Others: Yes   New problem(s) identified: None identified at this time.   Discharge Plan or Barriers: CSW will assess for appropriate discharge plan and relevant barriers.   Additional comments:  Patient and CSW reviewed pt's identified goals and treatment plan. Patient verbalized understanding and agreed to treatment plan. CSW reviewed [facility] "Discharge Process and Patient Involvement" Form. Pt verbalized understanding of information provided and signed form.   Reason for Continuation of Hospitalization:  Anxiety Depression Medication stabilization Suicidal ideation  Estimated length of stay: 3-5 days  Review of initial/current patient goals per problem list:   1.  Goal(s): Patient will participate in aftercare plan  Met:  No  Target date: 3-5 days from date of admission   As evidenced by: Patient will participate within aftercare plan AEB aftercare provider and housing plan at discharge being identified.  10/31/15: CSW to work with Pt to assess for appropriate discharge plan and faciliate appointments and referrals as needed prior to d/c.  2.  Goal (s): Patient will exhibit decreased depressive symptoms and suicidal ideations.  Met:  No  Target date: 3-5 days from date of admission   As evidenced by: Patient will  utilize self rating of depression at 3 or below and demonstrate decreased signs of depression or be deemed stable for discharge by MD. 10/31/15: Pt was admitted with symptoms of depression, rating 10/10. Pt continues to present with flat affect and depressive symptoms.  Pt will demonstrate decreased symptoms of depression and rate depression at 3/10 or lower prior to discharge.  3.  Goal(s): Patient will demonstrate decreased signs and symptoms of anxiety.  Met:  No  Target date: 3-5 days from date of admission   As evidenced by: Patient will utilize self rating of anxiety at 3 or below and demonstrated decreased signs of anxiety, or be deemed stable for discharge by MD 10/31/15: Pt was admitted with increased levels of anxiety and is currently rating those symptoms highly. Pt will demonstrated decreased symptoms of anxiety and rate it at 3/10 prior to d/c.  Attendees:  Patient:    Family:    Physician: Dr. Parke Poisson, MD  10/31/2015 1:29 PM  Nursing: Pat Kocher 10/31/2015 1:29 PM  Clinical Social Worker Peri Maris, [place] 10/31/2015 1:29 PM  Other: Tilden Fossa, [place] 10/31/2015 1:29 PM  Clinical:  RN 10/31/2015 1:29 PM  Other: , RN Charge Nurse 10/31/2015 1:29 PM  Other:     Peri Maris, [place] Work [phone]

## 2015-10-31 NOTE — [facility] Group Notes (Signed)
[facility] 10/31/2015 1:15pm  Type of Therapy: Mental Health Association Presentation  Participation Level: Active  Participation Quality: Attentive  Affect: Appropriate  Cognitive: Oriented  Insight: Developing/Improving  Engagement in Therapy: Engaged  Modes of Intervention: Discussion, Education and Socialization  Summary of Progress/Problems: Mental Health Association (MHA) Speaker came to talk about his personal journey with substance abuse and addiction. The pt processed ways by which to relate to the speaker. MHA speaker provided handouts and educational information pertaining to groups and services offered by the [facility]. Pt was engaged in speaker's presentation and was receptive to resources provided.    Chad CordialLauren Carter, LCSWA 10/31/2015 1:58 PM

## 2015-10-31 NOTE — Progress Notes (Signed)
D: Client visible on the unit, holding arm brace in her hand, "it itches" "when can I take my next anxiety medication" Client reports depression and anxiety "8" of 10, pain "7" in left arm. A: Writer reviewed medication, administered as ordered. Staff will monitor q5915min for safety. R: client is safe on the unit, attended karaoke.
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
## 2015-11-01 DIAGNOSIS — F332 Major depressive disorder, recurrent severe without psychotic features: Secondary | ICD-10-CM | POA: Insufficient documentation

## 2015-11-01 MED ORDER — HYDROXYZINE HCL 25 MG PO TABS
ORAL_TABLET | ORAL | Status: AC
  Filled 2015-11-01: qty 1

## 2015-11-01 MED ORDER — DIVALPROEX SODIUM ER 500 MG PO TB24
500.0000 mg | ORAL_TABLET | Freq: Every day | ORAL | Status: DC
  Administered 2015-11-01 – 2015-11-04 (×4): 500 mg via ORAL
  Filled 2015-11-01 (×6): qty 1

## 2015-11-01 NOTE — Progress Notes (Signed)
D Patricia Drake has had a god day today. She received 2 doses of prn vistaril, for anxiety.  that he feels like helped her. She completed her daily assessment and on it she wrote she deneid SI and she rated her depression , hopelessness and anxiety " 02/20/09", respectively. R She is glad t begin her depakote, for her bipolar ( as she puts it) tonight.

## 2015-11-01 NOTE — Progress Notes (Signed)
[facility] MD Progress Note  11/01/2015 4:45 PM Patricia Drake  MRN:  [id number] Subjective:  Patient reports she is feeling " a little bit better, but still depressed ".  Denies medication side effects. Objective : I have discussed case with treatment team and have met with patient . Patient reports ongoing depression, sense of sadness, but denies any current suicidal ideations and contracts for safety on unit. Denies medication side effects. Was restarted on Depakote ER and Dilantin, which she states she had been on for seizure management, without side effects. She had not been taking these medications recently . She is also on Effexor XR trial- no side effects thus far . No disruptive or agitated behaviors on unit . Limited milieu participation at this time. HgbA1C 5.6, TSH WNL   Principal Problem: Major depressive disorder, recurrent episode ([place]) Diagnosis:   Patient Active Problem List   Diagnosis Date Noted  . Major depressive disorder, recurrent episode ([place]) [F33.9] 10/30/2015  . Precordial chest pain [R07.2] 10/29/2015  . Overdose [T50.901A] 10/28/2015  . Intentional overdose of drug in tablet form ([place]) [T50.902A] 10/28/2015  . Leukocytosis [D72.829] 10/28/2015  . Drug overdose [T50.901A]   . Hypotension due to drugs [I95.2]   . Left wrist pain [M25.532] 08/08/2015  . Viral gastroenteritis [A08.4] 08/08/2015  . Seizure disorder ([place]) [X38.182] 08/08/2015  . MDD (major depressive disorder), recurrent severe, without psychosis ([place]) [F33.2] 06/26/2015  . PTSD (post-traumatic stress disorder) [F43.10] 06/26/2015   Total Time spent with patient: 20 minutes    Past Medical History:  Past Medical History  Diagnosis Date  . Seizures ([place])   . PTSD (post-traumatic stress disorder)   . Depression   . Anxiety   . Chronic diarrhea   . Collapsed lung   . Carpal tunnel syndrome     Past Surgical History  Procedure Laterality Date  . Cesarean section    . Tubal ligation      Family History: History reviewed. No pertinent family history.  Social History:  History  Alcohol Use No     History  Drug Use  . Yes  . Special: Marijuana    Social History   Social History  . Marital Status: Single    Spouse Name: N/A  . Number of Children: N/A  . Years of Education: N/A   Social History Main Topics  . Smoking status: Former Smoker    Types: Cigarettes  . Smokeless tobacco: Never Used  . Alcohol Use: No  . Drug Use: Yes    Special: Marijuana  . Sexual Activity: Yes    Birth Control/ Protection: Surgical   Other Topics Concern  . None   Social History Narrative   Additional Social History:    Pain Medications: see MAR Prescriptions: see MAR Over the Counter: see MAR History of alcohol / drug use?: Yes Longest period of sobriety (when/how long): couple of weeks Negative Consequences of Use: Financial, Scientist, research (physical sciences), Personal relationships, Work / School Withdrawal Symptoms: Other (Comment) (denies) Name of Substance 1: marijuana 1 - Age of First Use: unknown 1 - Frequency: every other day 1 - Duration: on and off 1 - Last Use / Amount: 10/26/15 Name of Substance 2: Methamphetamine 2 - Age of First Use: 29 2 - Amount (size/oz): unknown 2 - Frequency: once 2 - Duration: once 2 - Last Use / Amount: 10/26/15  Sleep: Good  Appetite:  Good  Current Medications: Current Facility-Administered Medications  Medication Dose Route Frequency Provider Last Rate Last Dose  .  acetaminophen (TYLENOL) tablet 650 mg  650 mg Oral Q6H PRN Encarnacion Slates, NP   650 mg at 10/31/15 1201  . albuterol (PROVENTIL HFA;VENTOLIN HFA) 108 (90 Base) MCG/ACT inhaler 2 puff  2 puff Inhalation Q6H PRN Encarnacion Slates, NP   2 puff at 11/01/15 0944  . alum & mag hydroxide-simeth (MAALOX/MYLANTA) 200-200-20 MG/5ML suspension 30 mL  30 mL Oral Q4H PRN Encarnacion Slates, NP      . diclofenac sodium (VOLTAREN) 1 % transdermal gel 2 g  2 g Topical QID Encarnacion Slates, NP   2 g at 11/01/15  1440  . divalproex (DEPAKOTE ER) 24 hr tablet 250 mg  250 mg Oral QHS Myer Peer Ziomara Birenbaum, MD   250 mg at 10/31/15 2133  . hydrOXYzine (ATARAX/VISTARIL) tablet 25 mg  25 mg Oral Q6H PRN Jenne Campus, MD   25 mg at 11/01/15 0951  . magnesium hydroxide (MILK OF MAGNESIA) suspension 30 mL  30 mL Oral Daily PRN Encarnacion Slates, NP      . naproxen (NAPROSYN) tablet 500 mg  500 mg Oral BID WC Encarnacion Slates, NP   500 mg at 11/01/15 0951  . nicotine (NICODERM CQ - dosed in mg/24 hours) patch 21 mg  21 mg Transdermal Q0600 Encarnacion Slates, NP   21 mg at 10/31/15 0600  . ondansetron (ZOFRAN-ODT) disintegrating tablet 4 mg  4 mg Oral Q8H PRN Encarnacion Slates, NP      . phenytoin (DILANTIN) ER capsule 100 mg  100 mg Oral BID Jenne Campus, MD   100 mg at 11/01/15 0948  . venlafaxine XR (EFFEXOR-XR) 24 hr capsule 75 mg  75 mg Oral Q breakfast Jenne Campus, MD   75 mg at 11/01/15 5361    Lab Results:  Results for orders placed or performed during the hospital encounter of 10/30/15 (from the past 48 hour(s))  Lipid panel, fasting     Status: Abnormal   Collection Time: 10/30/15  6:11 PM  Result Value Ref Range   Cholesterol 146 0 - 200 mg/dL   Triglycerides 200 (H) <150 mg/dL   HDL 40 (L) >40 mg/dL   Total CHOL/HDL Ratio 3.7 RATIO   VLDL 40 0 - 40 mg/dL   LDL Cholesterol 66 0 - 99 mg/dL    Comment:        Total Cholesterol/HDL:CHD Risk Coronary Heart Disease Risk Table                     Men   Women  1/2 Average Risk   3.4   3.3  Average Risk       5.0   4.4  2 X Average Risk   9.6   7.1  3 X Average Risk  23.4   11.0        Use the calculated Patient Ratio above and the CHD Risk Table to determine the patient's CHD Risk.        ATP III CLASSIFICATION (LDL):  <100     mg/dL   Optimal  100-129  mg/dL   Near or Above                    Optimal  130-159  mg/dL   Borderline  160-189  mg/dL   High  >190     mg/dL   Very High Performed at [facility]   Hemoglobin A1c     Status: None  Collection Time: 10/30/15  6:11 PM  Result Value Ref Range   Hgb A1c MFr Bld 5.6 4.8 - 5.6 %    Comment: (NOTE)         Pre-diabetes: 5.7 - 6.4         Diabetes: >6.4         Glycemic control for adults with diabetes: <7.0    Mean Plasma Glucose 114 mg/dL    Comment: (NOTE) Performed At: [facility] [place], [place] [id number] Lindon Romp MD KG:[id number] Performed at [facility]   TSH     Status: None   Collection Time: 10/30/15  6:11 PM  Result Value Ref Range   TSH 3.661 0.350 - 4.500 uIU/mL    Comment: Performed at [facility]    Blood Alcohol level:  Lab Results  Component Value Date   [facility] <5 10/27/2015   ETH <5 06/25/2015    Physical Findings: AIMS: Facial and Oral Movements Muscles of Facial Expression: None, normal Lips and Perioral Area: None, normal Jaw: None, normal Tongue: None, normal,Extremity Movements Upper (arms, wrists, hands, fingers): None, normal Lower (legs, knees, ankles, toes): None, normal, Trunk Movements Neck, shoulders, hips: None, normal, Overall Severity Severity of abnormal movements (highest score from questions above): None, normal Incapacitation due to abnormal movements: None, normal Patient's awareness of abnormal movements (rate only patient's report): No Awareness, Dental Status Current problems with teeth and/or dentures?: Yes Does patient usually wear dentures?: No  CIWA:    COWS:     Musculoskeletal: Strength & Muscle Tone: within normal limits Gait & Station: normal Patient leans: N/A  Psychiatric Specialty Exam: ROS  No headache, no chest pain, no shortness of breath, reports lower back pain, denies dysuria, or urgency, no seizure activity on unit   Blood pressure 98/67, pulse 74, temperature 98.5 F (36.9 C), temperature source Oral, resp. rate 16, height '5\' 2"'$  (1.575 m), weight 217 lb (98.431 kg), last menstrual period 10/20/2015, SpO2 99 %.Body mass  index is 39.68 kg/(m^2).  General Appearance: Fairly Groomed  Engineer, water::  Fair  Speech:  Normal Rate  Volume:  Normal  Mood:  depressed  Affect:  remains constricted but more reactive , smiles briefly at times   Thought Process:  Linear  Orientation:  Full (Time, Place, and Person)  Thought Content:  denies hallucinations, no delusions, not internally preoccupied at this time  Suicidal Thoughts:  No currently denies plan or intention of hurting self or of suicide, contracts for safety on the unit   Homicidal Thoughts:  No  Memory:  recent and remote grossly intact   Judgement:  Other:  improving   Insight:  improving   Psychomotor Activity:  Decreased  Concentration:  Good  Recall:  Good  Fund of Knowledge:Good  Language: Good  Akathisia:  Negative  Handed:  Right  AIMS (if indicated):     Assets:  Desire for Improvement Resilience  ADL's:  Intact  Cognition: WNL  Sleep:  Number of Hours: 6.5  Assessment - patient remains depressed, sad, but presents with partially improved range of affect . At this time denies suicidal ideations. No seizure activity on unit. Tolerating Effexor XR trial well and also was restarted on anti-seizure medications - Dilantin and Depakote ER .   Treatment Plan Summary: Daily contact with patient to assess and evaluate symptoms and progress in treatment, Medication management, Plan inpatient admission  and medications as below  Encourage increased participation in milieu,  groups to work on Radiographer, therapeutic and symptom reduction Continue Depakote ER at 500 mgrs QHS for history of seizure disorder Continue Dilantin 100 mgrs BID for history of seizure disorder  Continue Effexor XR 75 mgrs QDAY for depression  Continue Vistaril 25 mgrs Q 6 hours PRN for anxiety as needed  Neita Garnet, MD 11/01/2015, 4:45 PM

## 2015-11-01 NOTE — [facility] Group Notes (Signed)
[facility] LCSW Group Therapy Note  11/01/2015 2:45pm  Type of Therapy and Topic: Group Therapy: Holding onto Grudges   Pt did not attend, declined invitation.    Chad CordialLauren Carter, LCSWA 11/01/2015 5:05 PM

## 2015-11-01 NOTE — [facility] Group Notes (Signed)
[facility]  11/01/2015 8:45 AM  Pt did not attend, declined invitation.   Chad CordialLauren Carter, LCSWA 11/01/2015 9:35 AM
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
## 2015-11-01 NOTE — Progress Notes (Signed)
Recreation Therapy Notes  Date: 05.19.2017 Time: 9:30am Location: [address] Group Room   Group Topic: Stress Management  Goal Area(s) Addresses:  Patient will actively participate in stress management techniques presented during session.   Behavioral Response: Did not attend.    Marrianne Sica L Gunhild Bautch, LRT/CTRS        Lemma Tetro L 11/01/2015 1:56 PM 

## 2015-11-02 MED ORDER — MIRTAZAPINE 15 MG PO TABS
7.5000 mg | ORAL_TABLET | Freq: Every day | ORAL | Status: DC
  Administered 2015-11-02 – 2015-11-04 (×3): 7.5 mg via ORAL
  Filled 2015-11-02 (×4): qty 1

## 2015-11-02 MED ORDER — LORAZEPAM 1 MG PO TABS
1.0000 mg | ORAL_TABLET | Freq: Once | ORAL | Status: AC
  Administered 2015-11-02: 1 mg via ORAL
  Filled 2015-11-02: qty 1

## 2015-11-02 NOTE — Progress Notes (Signed)
[facility] MD Progress Note  11/02/2015 1:04 PM Patricia Drake  MRN:  [id number] Subjective: patient reports " I had a seizure just now."  Objective: REAGYN FACEMIRE is awake, alert and oriented X4 , found standing at the RN station.  Denies suicidal or homicidal ideation. Denies auditory or visual hallucination and does not appear to be responding to internal stimuli. . Patient reports she is medication compliant without mediation side effects.  States her depression 8/10. Patient states " Lavenia Atlas been having little seizure through the day on and off. Patient reports that she is able to be snapped out of the seizure, reports feeling she sometime has dizzy spells. Pt reports a fair appetite. States that she is not resting well throughout the night. (patient reports she tried to overdose on trazodone prior to admission. Support, encouragement and reassurance was provided.   Principal Problem: Major depressive disorder, recurrent episode (HCC) Diagnosis:   Patient Active Problem List   Diagnosis Date Noted  . Severe episode of recurrent major depressive disorder, without psychotic features (HCC) [F33.2]   . Major depressive disorder, recurrent episode (HCC) [F33.9] 10/30/2015  . Precordial chest pain [R07.2] 10/29/2015  . Overdose [T50.901A] 10/28/2015  . Intentional overdose of drug in tablet form (HCC) [T50.902A] 10/28/2015  . Leukocytosis [D72.829] 10/28/2015  . Drug overdose [T50.901A]   . Hypotension due to drugs [I95.2]   . Left wrist pain [M25.532] 08/08/2015  . Viral gastroenteritis [A08.4] 08/08/2015  . Seizure disorder (HCC) [G40.909] 08/08/2015  . MDD (major depressive disorder), recurrent severe, without psychosis (HCC) [F33.2] 06/26/2015  . PTSD (post-traumatic stress disorder) [F43.10] 06/26/2015   Total Time spent with patient: 30 minutes  Past Psychiatric History: See Above  Past Medical History:  Past Medical History  Diagnosis Date  . Seizures (HCC)   . PTSD (post-traumatic  stress disorder)   . Depression   . Anxiety   . Chronic diarrhea   . Collapsed lung   . Carpal tunnel syndrome     Past Surgical History  Procedure Laterality Date  . Cesarean section    . Tubal ligation     Family History: History reviewed. No pertinent family history. Family Psychiatric  History: See H&P Social History:  History  Alcohol Use No     History  Drug Use  . Yes  . Special: Marijuana    Social History   Social History  . Marital Status: Single    Spouse Name: N/A  . Number of Children: N/A  . Years of Education: N/A   Social History Main Topics  . Smoking status: Former Smoker    Types: Cigarettes  . Smokeless tobacco: Never Used  . Alcohol Use: No  . Drug Use: Yes    Special: Marijuana  . Sexual Activity: Yes    Birth Control/ Protection: Surgical   Other Topics Concern  . None   Social History Narrative   Additional Social History:    Pain Medications: see MAR Prescriptions: see MAR Over the Counter: see MAR History of alcohol / drug use?: Yes Longest period of sobriety (when/how long): couple of weeks Negative Consequences of Use: Financial, Armed forces operational officer, Personal relationships, Work / School Withdrawal Symptoms: Other (Comment) (denies) Name of Substance 1: marijuana 1 - Age of First Use: unknown 1 - Frequency: every other day 1 - Duration: on and off 1 - Last Use / Amount: 10/26/15 Name of Substance 2: Methamphetamine 2 - Age of First Use: 29 2 - Amount (size/oz): unknown 2 - Frequency: once  2 - Duration: once 2 - Last Use / Amount: 10/26/15                Sleep: Fair  Appetite:  Good  Current Medications: Current Facility-Administered Medications  Medication Dose Route Frequency Provider Last Rate Last Dose  . acetaminophen (TYLENOL) tablet 650 mg  650 mg Oral Q6H PRN Sanjuana KavaAgnes I Nwoko, NP   650 mg at 10/31/15 1201  . albuterol (PROVENTIL HFA;VENTOLIN HFA) 108 (90 Base) MCG/ACT inhaler 2 puff  2 puff Inhalation Q6H PRN Sanjuana KavaAgnes I  Nwoko, NP   2 puff at 11/02/15 0754  . alum & mag hydroxide-simeth (MAALOX/MYLANTA) 200-200-20 MG/5ML suspension 30 mL  30 mL Oral Q4H PRN Sanjuana KavaAgnes I Nwoko, NP      . diclofenac sodium (VOLTAREN) 1 % transdermal gel 2 g  2 g Topical QID Sanjuana KavaAgnes I Nwoko, NP   2 g at 11/02/15 1210  . divalproex (DEPAKOTE ER) 24 hr tablet 500 mg  500 mg Oral QHS Rockey SituFernando A Cobos, MD   500 mg at 11/01/15 2131  . hydrOXYzine (ATARAX/VISTARIL) tablet 25 mg  25 mg Oral Q6H PRN Craige CottaFernando A Cobos, MD   25 mg at 11/02/15 0756  . magnesium hydroxide (MILK OF MAGNESIA) suspension 30 mL  30 mL Oral Daily PRN Sanjuana KavaAgnes I Nwoko, NP      . naproxen (NAPROSYN) tablet 500 mg  500 mg Oral BID WC Sanjuana KavaAgnes I Nwoko, NP   500 mg at 11/02/15 0754  . nicotine (NICODERM CQ - dosed in mg/24 hours) patch 21 mg  21 mg Transdermal Q0600 Sanjuana KavaAgnes I Nwoko, NP   21 mg at 10/31/15 0600  . ondansetron (ZOFRAN-ODT) disintegrating tablet 4 mg  4 mg Oral Q8H PRN Sanjuana KavaAgnes I Nwoko, NP      . phenytoin (DILANTIN) ER capsule 100 mg  100 mg Oral BID Craige CottaFernando A Cobos, MD   100 mg at 11/02/15 0754  . venlafaxine XR (EFFEXOR-XR) 24 hr capsule 75 mg  75 mg Oral Q breakfast Craige CottaFernando A Cobos, MD   75 mg at 11/02/15 [id number]    Lab Results: No results found for this or any previous visit (from the past 48 hour(s)).  Blood Alcohol level:  Lab Results  Component Value Date   ETH <5 10/27/2015   ETH <5 06/25/2015    Physical Findings: AIMS: Facial and Oral Movements Muscles of Facial Expression: None, normal Lips and Perioral Area: None, normal Jaw: None, normal Tongue: None, normal,Extremity Movements Upper (arms, wrists, hands, fingers): None, normal Lower (legs, knees, ankles, toes): None, normal, Trunk Movements Neck, shoulders, hips: None, normal, Overall Severity Severity of abnormal movements (highest score from questions above): None, normal Incapacitation due to abnormal movements: None, normal Patient's awareness of abnormal movements (rate only patient's report):  No Awareness, Dental Status Current problems with teeth and/or dentures?: Yes Does patient usually wear dentures?: No  CIWA:    COWS:     Musculoskeletal: Strength & Muscle Tone: within normal limits Gait & Station: normal Patient leans: N/A  Psychiatric Specialty Exam: Review of Systems  Psychiatric/Behavioral: Positive for depression and substance abuse. Negative for suicidal ideas. The patient is nervous/anxious.   All other systems reviewed and are negative.   Blood pressure 109/73, pulse 67, temperature 98.3 F (36.8 C), temperature source Oral, resp. rate 20, height 5\' 2"  (1.575 m), weight 98.431 kg (217 lb), last menstrual period 10/20/2015, SpO2 99 %.Body mass index is 39.68 kg/(m^2).  General Appearance: Casual  Eye Contact::  Fair  Speech:  Clear and Coherent  Volume:  Normal  Mood:  Depressed  Affect:  Congruent  Thought Process:  Logical  Orientation:  Full (Time, Place, and Person)  Thought Content:  Hallucinations: None  Suicidal Thoughts:  Yes.  with intent/plan  Homicidal Thoughts:  No  Memory:  Immediate;   Fair Recent;   Fair Remote;   Fair  Judgement:  Fair  Insight:  Fair  Psychomotor Activity:  Restlessness  Concentration:  Fair  Recall:  [employer] of Knowledge:Fair  Language: Good  Akathisia:  No  Handed:  Right  AIMS (if indicated):     Assets:  Desire for Improvement Resilience Social Support  ADL's:  Intact  Cognition: WNL  Sleep:  Number of Hours: 5.75     I agree with current treatment plan on 11/02/2015, Patient seen face-to-face for psychiatric evaluation follow-up, chart reviewed. Reviewed the information documented and agree with the treatment plan.  Treatment Plan Summary: Daily contact with patient to assess and evaluate symptoms and progress in treatment and Medication management   Encourage increased participation in milieu, groups to work on coping skills and symptom reduction Continue Depakote ER at 500 mgrs QHS for history  of seizure disorder Continue Dilantin 100 mgrs BID for history of seizure disorder  Continue Effexor XR 75 mgrs QDAY for depression  Continue Vistaril 25 mgrs Q 6 hours PRN for anxiety as needed  Start Remeron 7.5mg  PO QHS for insomnia  One time Ativan 1 mg for Anxiety/" seizure activity" Patient wants to d/c Trazodone as states not helping  Vistaril PRNs for anxiety, insomnia   Oneta Rack, NP 11/02/2015, 1:04 PM

## 2015-11-02 NOTE — Progress Notes (Signed)
Patient ID: Patricia Drake, female   DOB: May 19, 1986, 30 y.o.   MRN: [id number]   Adult Psychoeducational Group Note  Date:  11/02/2015 Time: 01:30pm  Group Topic/Focus:  Developing a Wellness Toolbox:   The focus of this group is to help patients develop a "wellness toolbox" with skills and strategies to promote recovery upon discharge. FOCUS on understanding and developing different perspectives.   Participation Level:  Minimal  Participation Quality:  Inattentive, Monopolizing and Redirectable  Affect:  Blunted  Cognitive:  Alert and Oriented  Insight: Lacking  Engagement in Group:  Defensive, Distracting and Lacking  Modes of Intervention:  Activity, Discussion and Education  Additional Comments:  Pt able to change irrational thought of "I'm a bad artist" to "I'm a badass artist."   Patricia Drake, Patricia Drake E 11/02/2015, 4:24 PM

## 2015-11-02 NOTE — Progress Notes (Signed)
Patient ID: Henreitta CeaRoxanne J Neuenfeldt, female   DOB: 1986-01-25, 30 y.o.   MRN: [id number]   Pt currently presents with a flat affect and anxious behavior. Per self inventory, pt rates depression at a 8, hopelessness 7 and anxiety 9. Pt does not report a goal today. Pt reports poor sleep, a fair appetite, normal energy and poor concentration. Pt reports increased anxiety today, states that her current prn medication is not working. Pt seen interacting positively with other pts in the dayroom throughout the day. Pt reports hand pain in her L hand, reports that the cream she is using is currently relieves some of her pain.  Pt provided with medications per providers orders. Pt's labs and vitals were monitored throughout the day. Pt supported emotionally and encouraged to express concerns and questions. Pt educated on medications and alternative anxiety reducing techniques, needs reinforcement.   Pt's safety ensured with 15 minute and environmental checks. Pt currently denies SI/HI and A/V hallucinations. Pt verbally agrees to seek staff if SI/HI or A/VH occurs and to consult with staff before acting on any harmful thoughts.  Will continue POC.

## 2015-11-02 NOTE — Plan of Care (Signed)
Problem: Activity: Goal: Interest or engagement in activities will improve Outcome: Progressing Client is seen in dayroom interacting appropriately with peers.

## 2015-11-02 NOTE — Progress Notes (Signed)
Adult Psychoeducational Group Note  Date:  11/02/2015 Time:  9:42 PM  Group Topic/Focus:  Wrap-Up Group:   The focus of this group is to help patients review their daily goal of treatment and discuss progress on daily workbooks.  Participation Level:  Minimal  Participation Quality:  Appropriate  Affect:  Appropriate  Cognitive:  Alert  Insight: Appropriate  Engagement in Group:  Engaged  Modes of Intervention:  Discussion  Additional Comments:  Pt rated her day 7/10. Pt stated that she does not have any coping skills.   Kaleen OdeaCOOKE, Annya Lizana R 11/02/2015, 9:42 PM

## 2015-11-02 NOTE — [facility] Group Notes (Signed)
[facility] LCSW Group Therapy Note 11/02/2015 at 10:10 AM  Type of Therapy and Topic:  Group Therapy: Avoiding Self-Sabotaging and Enabling Behaviors  Participation Level:  Did Not Attend although encouraged to do so prior to group    Devora Tortorella C Akhil Piscopo, LCSW 

## 2015-11-03 MED ORDER — LORAZEPAM 0.5 MG PO TABS
0.5000 mg | ORAL_TABLET | Freq: Once | ORAL | Status: AC
  Administered 2015-11-03: 0.5 mg via ORAL
  Filled 2015-11-03: qty 1

## 2015-11-03 MED ORDER — ALBUTEROL SULFATE HFA 108 (90 BASE) MCG/ACT IN AERS
2.0000 | INHALATION_SPRAY | RESPIRATORY_TRACT | Status: DC | PRN
Start: 2015-11-03 — End: 2015-11-05
  Administered 2015-11-03: 2 via RESPIRATORY_TRACT

## 2015-11-03 NOTE — [facility] Group Notes (Signed)
[facility] Group Notes:  (Nursing/MHT/Case Management/Adjunct)  Date:  11/03/2015  Time:  4:08 PM  Type of Therapy:  Psychoeducational Skills  Participation Level:  Active  Participation Quality:  Appropriate and Attentive  Affect:  Blunted  Cognitive:  Appropriate  Insight:  Improving  Engagement in Group:  Engaged  Modes of Intervention:  Activity, Discussion, Education and Support  Summary of Progress/Problems: She attended group and participated. She reports that her energy level is 5/10 today and a place [place]. She reports that she uses the [phone] game when overwhelmed.   Marzetta BoardDopson, Alif Petrak E 11/03/2015, 4:08 PM

## 2015-11-03 NOTE — Progress Notes (Signed)
Patient ID: Patricia Drake, female   DOB: 07/31/1985, 30 y.o.   MRN: [id number] D: Client visible on the unit, initially interacting with clients laughing talking in day room then client approached writer irritable "can I get my anxiety medications, too much noise in there to much talking going on that's going to make me have a seizure" A: Writer provided emotional support, medications reviewed, administered as ordered. Staff will monitor q6015min for safety. R: Client is safe on the unit, attended group.

## 2015-11-03 NOTE — Plan of Care (Signed)
Problem: Safety: Goal: Ability to disclose and discuss suicidal ideas will improve Outcome: Progressing Loistine denies SI

## 2015-11-03 NOTE — Progress Notes (Signed)
[facility] MD Progress Note  11/03/2015 2:29 PM Patricia Drake  MRN:  [id number] Subjective: patient reports " I am feel like I am going to have another seizure, but I feel like that medication help them on yesterday."  Objective: Patricia Drake is awake, alert and oriented X4 , found resting in bedroom. Denies  suicidal or homicidal ideation. Denies auditory or visual hallucination and does not appear to be responding to internal stimuli.  Patient reports she is medication compliant without mediation side effects.  States her depression is till 87/10. Patient states  "Lavenia Atlas still been having little seizure through the day on and off however the ativan was helpful to not cause a big seizure event'. Orders placed for wheelchair for patient safety. Pt reports a fair appetite. States that she is not resting well throughout the night. (patient reports she tried to overdose on trazodone prior to admission.) patient reports that the Remeron was helpful. Support, encouragement and reassurance was provided.   Principal Problem: Major depressive disorder, recurrent episode (HCC) Diagnosis:   Patient Active Problem List   Diagnosis Date Noted  . Severe episode of recurrent major depressive disorder, without psychotic features (HCC) [F33.2]   . Major depressive disorder, recurrent episode (HCC) [F33.9] 10/30/2015  . Precordial chest pain [R07.2] 10/29/2015  . Overdose [T50.901A] 10/28/2015  . Intentional overdose of drug in tablet form (HCC) [T50.902A] 10/28/2015  . Leukocytosis [D72.829] 10/28/2015  . Drug overdose [T50.901A]   . Hypotension due to drugs [I95.2]   . Left wrist pain [M25.532] 08/08/2015  . Viral gastroenteritis [A08.4] 08/08/2015  . Seizure disorder (HCC) [G40.909] 08/08/2015  . MDD (major depressive disorder), recurrent severe, without psychosis (HCC) [F33.2] 06/26/2015  . PTSD (post-traumatic stress disorder) [F43.10] 06/26/2015   Total Time spent with patient: 30 minutes  Past Psychiatric  History: See Above  Past Medical History:  Past Medical History  Diagnosis Date  . Seizures (HCC)   . PTSD (post-traumatic stress disorder)   . Depression   . Anxiety   . Chronic diarrhea   . Collapsed lung   . Carpal tunnel syndrome     Past Surgical History  Procedure Laterality Date  . Cesarean section    . Tubal ligation     Family History: History reviewed. No pertinent family history. Family Psychiatric  History: See H&P Social History:  History  Alcohol Use No     History  Drug Use  . Yes  . Special: Marijuana    Social History   Social History  . Marital Status: Single    Spouse Name: N/A  . Number of Children: N/A  . Years of Education: N/A   Social History Main Topics  . Smoking status: Former Smoker    Types: Cigarettes  . Smokeless tobacco: Never Used  . Alcohol Use: No  . Drug Use: Yes    Special: Marijuana  . Sexual Activity: Yes    Birth Control/ Protection: Surgical   Other Topics Concern  . None   Social History Narrative   Additional Social History:    Pain Medications: see MAR Prescriptions: see MAR Over the Counter: see MAR History of alcohol / drug use?: Yes Longest period of sobriety (when/how long): couple of weeks Negative Consequences of Use: Financial, [employer] forces operational officer, Personal relationships, Work / School Withdrawal Symptoms: Other (Comment) (denies) Name of Substance 1: marijuana 1 - Age of First Use: unknown 1 - Frequency: every other day 1 - Duration: on and off 1 - Last Use / Amount:  10/26/15 Name of Substance 2: Methamphetamine 2 - Age of First Use: 29 2 - Amount (size/oz): unknown 2 - Frequency: once 2 - Duration: once 2 - Last Use / Amount: 10/26/15                Sleep: Fair  Appetite:  Good  Current Medications: Current Facility-Administered Medications  Medication Dose Route Frequency Provider Last Rate Last Dose  . acetaminophen (TYLENOL) tablet 650 mg  650 mg Oral Q6H PRN Sanjuana Kava, NP   650 mg at  10/31/15 1201  . albuterol (PROVENTIL HFA;VENTOLIN HFA) 108 (90 Base) MCG/ACT inhaler 2 puff  2 puff Inhalation Q4H PRN Oneta Rack, NP      . alum & mag hydroxide-simeth (MAALOX/MYLANTA) 200-200-20 MG/5ML suspension 30 mL  30 mL Oral Q4H PRN Sanjuana Kava, NP      . diclofenac sodium (VOLTAREN) 1 % transdermal gel 2 g  2 g Topical QID Sanjuana Kava, NP   2 g at 11/02/15 2115  . divalproex (DEPAKOTE ER) 24 hr tablet 500 mg  500 mg Oral QHS Rockey Situ Cobos, MD   500 mg at 11/02/15 2115  . hydrOXYzine (ATARAX/VISTARIL) tablet 25 mg  25 mg Oral Q6H PRN Craige Cotta, MD   25 mg at 11/03/15 0820  . LORazepam (ATIVAN) tablet 0.5 mg  0.5 mg Oral Once Oneta Rack, NP      . magnesium hydroxide (MILK OF MAGNESIA) suspension 30 mL  30 mL Oral Daily PRN Sanjuana Kava, NP      . mirtazapine (REMERON) tablet 7.5 mg  7.5 mg Oral QHS Oneta Rack, NP   7.5 mg at 11/02/15 2115  . naproxen (NAPROSYN) tablet 500 mg  500 mg Oral BID WC Sanjuana Kava, NP   500 mg at 11/03/15 0819  . nicotine (NICODERM CQ - dosed in mg/24 hours) patch 21 mg  21 mg Transdermal Q0600 Sanjuana Kava, NP   21 mg at 10/31/15 0600  . ondansetron (ZOFRAN-ODT) disintegrating tablet 4 mg  4 mg Oral Q8H PRN Sanjuana Kava, NP      . phenytoin (DILANTIN) ER capsule 100 mg  100 mg Oral BID Craige Cotta, MD   100 mg at 11/03/15 0819  . venlafaxine XR (EFFEXOR-XR) 24 hr capsule 75 mg  75 mg Oral Q breakfast Craige Cotta, MD   75 mg at 11/03/15 1610    Lab Results: No results found for this or any previous visit (from the past 48 hour(s)).  Blood Alcohol level:  Lab Results  Component Value Date   ETH <5 10/27/2015   ETH <5 06/25/2015    Physical Findings: AIMS: Facial and Oral Movements Muscles of Facial Expression: None, normal Lips and Perioral Area: None, normal Jaw: None, normal Tongue: None, normal,Extremity Movements Upper (arms, wrists, hands, fingers): None, normal Lower (legs, knees, ankles, toes): None,  normal, Trunk Movements Neck, shoulders, hips: None, normal, Overall Severity Severity of abnormal movements (highest score from questions above): None, normal Incapacitation due to abnormal movements: None, normal Patient's awareness of abnormal movements (rate only patient's report): No Awareness, Dental Status Current problems with teeth and/or dentures?: Yes Does patient usually wear dentures?: No  CIWA:    COWS:     Musculoskeletal: Strength & Muscle Tone: within normal limits Gait & Station: normal, wheelchair orders placed for safety Patient leans: N/A  Psychiatric Specialty Exam: Review of Systems  Psychiatric/Behavioral: Positive for depression and substance abuse.  Negative for suicidal ideas. The patient is nervous/anxious.   All other systems reviewed and are negative.   Blood pressure 102/64, pulse 81, temperature 97.6 F (36.4 C), temperature source Oral, resp. rate 16, height 5\' 2"  (1.575 m), weight 98.431 kg (217 lb), last menstrual period 10/20/2015, SpO2 99 %.Body mass index is 39.68 kg/(m^2).  General Appearance: Casual  Eye Contact::  Fair  Speech:  Clear and Coherent  Volume:  Normal  Mood:  Depressed  Affect:  Congruent  Thought Process:  Logical  Orientation:  Full (Time, Place, and Person)  Thought Content:  Hallucinations: None  Suicidal Thoughts:  No   Homicidal Thoughts:  No  Memory:  Immediate;   Fair Recent;   Fair Remote;   Fair  Judgement:  Fair  Insight:  Fair  Psychomotor Activity:  Restlessness  Concentration:  Fair  Recall:  [employer]Fair  Fund of Knowledge:Fair  Language: Good  Akathisia:  No  Handed:  Right  AIMS (if indicated):     Assets:  Desire for Improvement Resilience Social Support  ADL's:  Intact  Cognition: WNL  Sleep:  Number of Hours: 3.5     I agree with current treatment plan on 11/03/2015, Patient seen face-to-face for psychiatric evaluation follow-up, chart reviewed. Reviewed the information documented and agree with the  treatment plan.  Treatment Plan Summary: Daily contact with patient to assess and evaluate symptoms and progress in treatment and Medication management   Encourage increased participation in milieu, groups to work on coping skills and symptom reduction Continue Depakote ER at 500 mgrs QHS for history of seizure disorder Continue Dilantin 100 mgrs BID for history of seizure disorder  Continue Effexor XR 75 mgrs QDAY for depression  Continue Vistaril 25 mgrs Q 6 hours PRN for anxiety as needed  Start Remeron 7.5mg  PO QHS for insomnia  One time Ativan 0.5 mg for Anxiety/ unwitnessed seizure activity. Patient wants to d/c Trazodone as states not helping  Vistaril PRNs for anxiety, insomnia   Oneta Rackanika N Fadil Macmaster, NP 11/03/2015, 2:29 PM

## 2015-11-03 NOTE — Progress Notes (Signed)
Patient ID: Patricia Drake, female   DOB: 01-25-1986, 30 y.o.   MRN: [id number]  DAR: Pt. Denies SI/HI and A/V Hallucinations. She reports sleep is fair, appetite is fair, energy level is normal, and concentration is good. She rates depression 6/10, hopelessness 6/10, and anxiety 8/10. She received PRN Vistaril this morning for anxiety but reports no change. She received a one time dose of Ativan and that appeared to help. She also reported some shortness of breath and received her inhaler. Patient does report pain in left hand and is receiving scheduled medications for this. Support and encouragement provided to the patient. Scheduled medications administered to patient per physician's orders. Patient is cooperative, seen in the milieu interacting with peers and attending groups. She continues to use the wheelchair to ambulate and used the tub room this morning for hygiene purposes. Q15 minute checks are maintained for safety.

## 2015-11-03 NOTE — [facility] Group Notes (Signed)
[facility] LCSW Group Therapy Note   11/03/2015  10:30 AM   Type of Therapy and Topic: Group Therapy: Feelings Around Returning Home & Establishing a Supportive Framework  Participation Level:  Did Not Attend although invited    Carney Bernatherine C Harrill, LCSW

## 2015-11-03 NOTE — Progress Notes (Signed)
D: This writer was notified around 19:50 that patient had seizures in the day room. Upon approach was sitting on a chair. Appear anxious and animated. Patient stated "I don't know what happened, all of a sudden my muscles tightened and I twitched. Patient  appear to be laughing as she speaks. Vital signs WNL. (SEE CHART). Patient denies pain, SI, ah/vh at this time. Patient refused the offer to go to her room and lay down as offered by staff. Patient stated "I'm good here. Its just a seizure".  A: Staff offered support and encouragement. Due meds/prn given as ordered. Every 15 minutes check for safety maintained. Will continue to monitor patient for safety and stability. R: Patient remains safe.

## 2015-11-03 NOTE — [facility] Group Notes (Signed)
Patient attend group her day was a 7. She did not meet goal.

## 2015-11-04 LAB — URINALYSIS, ROUTINE W REFLEX MICROSCOPIC
Bilirubin Urine: NEGATIVE
Glucose, UA: NEGATIVE mg/dL
HGB URINE DIPSTICK: NEGATIVE
Ketones, ur: NEGATIVE mg/dL
NITRITE: NEGATIVE
PROTEIN: NEGATIVE mg/dL
SPECIFIC GRAVITY, URINE: 1.026 (ref 1.005–1.030)
pH: 6.5 (ref 5.0–8.0)

## 2015-11-04 LAB — URINE MICROSCOPIC-ADD ON

## 2015-11-04 MED ORDER — VENLAFAXINE HCL ER 37.5 MG PO CP24
112.5000 mg | ORAL_CAPSULE | Freq: Every day | ORAL | Status: DC
  Administered 2015-11-05: 112.5 mg via ORAL
  Filled 2015-11-04 (×2): qty 3

## 2015-11-04 NOTE — [facility] Group Notes (Signed)
Adult Psychoeducational Group Note  Date:  11/04/2015 Time:  10:04 PM  Group Topic/Focus:  Wrap-Up Group:   The focus of this group is to help patients review their daily goal of treatment and discuss progress on daily workbooks.  Participation Level:  Active  Participation Quality:  Appropriate  Affect:  Appropriate  Cognitive:  Appropriate  Insight: Appropriate  Engagement in Group:  Engaged  Modes of Intervention:  Discussion  Additional Comments:  Pt rated her day a 10 because she stated she had nowhere to go once she leaves here and her biological dad is letting her stay with him.  She expressed that she just started talking to her dad two years ago and she had just met him at age 60.  Her goal was to leave.  Pt is discharging tomorrow.  Victorino Sparrow A 11/04/2015, 10:04 PM

## 2015-11-04 NOTE — [facility] Group Notes (Signed)
[facility] LCSW Group Therapy 11/04/2015  1:15 PM   Type of Therapy: Group Therapy  Participation Level: Did Not Attend. Patient invited to participate but declined.   Fidencio Duddy, MSW, LCSW Clinical Social Worker [facility] [phone]   

## 2015-11-04 NOTE — Progress Notes (Signed)
Patient ID: Patricia Drake, female   DOB: 1985/06/24, 30 y.o.   MRN: [id number] [facility] MD Progress Note  11/04/2015 1:30 PM Patricia Drake  MRN:  [id number] Subjective: patient reports partial improvement compared to admission, but reports some ongoing depression and anxiety. Denies medication side effects. She is more future oriented, and spoke about disposition plans/ outpatient treatment .  Objective:  I have discussed case with treatment team and have met with patient. Staff report patient is improving but still presenting with labile affect at times . Patient reports partial improvement, states still depressed,but feeling " better". At this time denies suicidal ideations. She has a history of seizure disorder, and yesterday had stated she felt as if she was going to have a seizure, but today states feeling better. At this time denies any abnormal neurological feelings or symptoms and has had no seizure activity on unit . No disruptive or agitated behaviors on unit . Denies medication side effects.   Principal Problem: Major depressive disorder, recurrent episode ([place]) Diagnosis:   Patient Active Problem List   Diagnosis Date Noted  . Severe episode of recurrent major depressive disorder, without psychotic features ([place]) [F33.2]   . Major depressive disorder, recurrent episode ([place]) [F33.9] 10/30/2015  . Precordial chest pain [R07.2] 10/29/2015  . Overdose [T50.901A] 10/28/2015  . Intentional overdose of drug in tablet form ([place]) [T50.902A] 10/28/2015  . Leukocytosis [D72.829] 10/28/2015  . Drug overdose [T50.901A]   . Hypotension due to drugs [I95.2]   . Left wrist pain [M25.532] 08/08/2015  . Viral gastroenteritis [A08.4] 08/08/2015  . Seizure disorder ([place]) [A21.308] 08/08/2015  . MDD (major depressive disorder), recurrent severe, without psychosis ([place]) [F33.2] 06/26/2015  . PTSD (post-traumatic stress disorder) [F43.10] 06/26/2015   Total Time spent with patient: 25 minutes    Past Psychiatric History: See Above  Past Medical History:  Past Medical History  Diagnosis Date  . Seizures ([place])   . PTSD (post-traumatic stress disorder)   . Depression   . Anxiety   . Chronic diarrhea   . Collapsed lung   . Carpal tunnel syndrome     Past Surgical History  Procedure Laterality Date  . Cesarean section    . Tubal ligation     Family History: History reviewed. No pertinent family history. Family Psychiatric  History: See H&P Social History:  History  Alcohol Use No     History  Drug Use  . Yes  . Special: Marijuana    Social History   Social History  . Marital Status: Single    Spouse Name: N/A  . Number of Children: N/A  . Years of Education: N/A   Social History Main Topics  . Smoking status: Former Smoker    Types: Cigarettes  . Smokeless tobacco: Never Used  . Alcohol Use: No  . Drug Use: Yes    Special: Marijuana  . Sexual Activity: Yes    Birth Control/ Protection: Surgical   Other Topics Concern  . None   Social History Narrative   Additional Social History:    Pain Medications: see MAR Prescriptions: see MAR Over the Counter: see MAR History of alcohol / drug use?: Yes Longest period of sobriety (when/how long): couple of weeks Negative Consequences of Use: Financial, Scientist, research (physical sciences), Personal relationships, Work / School Withdrawal Symptoms: Other (Comment) (denies) Name of Substance 1: marijuana 1 - Age of First Use: unknown 1 - Frequency: every other day 1 - Duration: on and off 1 - Last Use / Amount: 10/26/15  Name of Substance 2: Methamphetamine 2 - Age of First Use: 29 2 - Amount (size/oz): unknown 2 - Frequency: once 2 - Duration: once 2 - Last Use / Amount: 10/26/15  Sleep: Fair  Appetite:  Good  Current Medications: Current Facility-Administered Medications  Medication Dose Route Frequency Provider Last Rate Last Dose  . acetaminophen (TYLENOL) tablet 650 mg  650 mg Oral Q6H PRN Encarnacion Slates, NP   650 mg at  10/31/15 1201  . albuterol (PROVENTIL HFA;VENTOLIN HFA) 108 (90 Base) MCG/ACT inhaler 2 puff  2 puff Inhalation Q4H PRN Derrill Center, NP   2 puff at 11/03/15 1429  . alum & mag hydroxide-simeth (MAALOX/MYLANTA) 200-200-20 MG/5ML suspension 30 mL  30 mL Oral Q4H PRN Encarnacion Slates, NP      . diclofenac sodium (VOLTAREN) 1 % transdermal gel 2 g  2 g Topical QID Encarnacion Slates, NP   2 g at 11/03/15 2145  . divalproex (DEPAKOTE ER) 24 hr tablet 500 mg  500 mg Oral QHS Myer Peer Cobos, MD   500 mg at 11/03/15 2143  . hydrOXYzine (ATARAX/VISTARIL) tablet 25 mg  25 mg Oral Q6H PRN Jenne Campus, MD   25 mg at 11/03/15 2022  . magnesium hydroxide (MILK OF MAGNESIA) suspension 30 mL  30 mL Oral Daily PRN Encarnacion Slates, NP      . mirtazapine (REMERON) tablet 7.5 mg  7.5 mg Oral QHS Derrill Center, NP   7.5 mg at 11/03/15 2143  . naproxen (NAPROSYN) tablet 500 mg  500 mg Oral BID WC Encarnacion Slates, NP   500 mg at 11/04/15 0853  . ondansetron (ZOFRAN-ODT) disintegrating tablet 4 mg  4 mg Oral Q8H PRN Encarnacion Slates, NP      . phenytoin (DILANTIN) ER capsule 100 mg  100 mg Oral BID Jenne Campus, MD   100 mg at 11/04/15 0853  . venlafaxine XR (EFFEXOR-XR) 24 hr capsule 75 mg  75 mg Oral Q breakfast Jenne Campus, MD   75 mg at 11/04/15 7616    Lab Results: No results found for this or any previous visit (from the past 48 hour(s)).  Blood Alcohol level:  Lab Results  Component Value Date   ETH <5 10/27/2015   ETH <5 06/25/2015    Physical Findings: AIMS: Facial and Oral Movements Muscles of Facial Expression: None, normal Lips and Perioral Area: None, normal Jaw: None, normal Tongue: None, normal,Extremity Movements Upper (arms, wrists, hands, fingers): None, normal Lower (legs, knees, ankles, toes): None, normal, Trunk Movements Neck, shoulders, hips: None, normal, Overall Severity Severity of abnormal movements (highest score from questions above): None, normal Incapacitation due to  abnormal movements: None, normal Patient's awareness of abnormal movements (rate only patient's report): No Awareness, Dental Status Current problems with teeth and/or dentures?: Yes Does patient usually wear dentures?: No  CIWA:    COWS:     Musculoskeletal: Strength & Muscle Tone: within normal limits Gait & Station: normal, wheelchair orders placed for safety Patient leans: N/A  Psychiatric Specialty Exam: Review of Systems  Psychiatric/Behavioral: Positive for depression and substance abuse. Negative for suicidal ideas. The patient is nervous/anxious.   All other systems reviewed and are negative.   Blood pressure 106/57, pulse 83, temperature 98.4 F (36.9 C), temperature source Oral, resp. rate 18, height _0  (1.575 m), weight 217 lb (98.431 kg), last menstrual period 10/20/2015, SpO2 99 %.Body mass index is 39.68 kg/(m^2).  General Appearance: fairly  groomed   Engineer, water::  Good   Speech:  Clear and Coherent  Volume:  Normal  Mood:  Improving, less depressed   Affect: improved, but still labile   Thought Process:  Linear  Orientation:  Full (Time, Place, and Person)  Thought Content:  No hallucinations, no delusions   Suicidal Thoughts:  No at this time denies any suicidal ideations, and denies any self injurious ideations   Homicidal Thoughts:  No denies homicidal or violent ideations   Memory:   Recent and remote grossly intact   Judgement:   Improving   Insight:  Improving   Psychomotor Activity:  Normal  Concentration:  Good  Recall:  Good  Fund of Knowledge:Good  Language: Good  Akathisia:  No  Handed:  Right  AIMS (if indicated):     Assets:  Desire for Improvement Resilience Social Support  ADL's:  Intact  Cognition: WNL  Sleep:  Number of Hours: 3.5   Assessment - patient presents with partial improvement , less severely depressed, no current suicidal ideations. She presents with a more reactive affect, but is still labile and at times tearful, as per  report from staff. Tolerating medications well . No seizure activity on unit. Patient reports some dysuria today- no fever, no chills, will order UA   Treatment Plan Summary: Daily contact with patient to assess and evaluate symptoms and progress in treatment and Medication management   Encourage increased participation in milieu, groups to work on coping skills and symptom reduction Continue Depakote ER at 500 mgrs QHS for history of seizure disorder Continue Dilantin 100 mgrs BID for history of seizure disorder  Increase  Effexor XR to 112.5 mgrs QDAY for depression  Continue Vistaril 25 mgrs Q 6 hours PRN for anxiety as needed  Continue  Remeron 7.68m PO QHS for insomnia  Vistaril PRNs for anxiety, insomnia  Check UA/ Check  Valproic Acid and Dilantin serum levels   COBOS, FERNANDO, MD 11/04/2015, 1:30 PM

## 2015-11-04 NOTE — Plan of Care (Signed)
Problem: Activity: Goal: Sleeping patterns will improve Outcome: Progressing Patient reports sleeping well last night

## 2015-11-04 NOTE — Progress Notes (Signed)
Patient ID: Patricia Drake, female   DOB: [birth year], 30 y.o.   MRN: [id number]  DAR: Pt. Denies SI/HI and A/V Hallucinations. She reports sleep is good, appetite is good, energy level is normal, and concentration is good. She rates depression 4/10, hopelessness 4/10, and anxiety 5/10. Patient does report pain in left hand and continues to receive schedule Naproxen. Support and encouragement provided to the patient. Scheduled medications administered to patient per physician's orders. Patient reports anxiety this afternoon and received PRN Vistaril which provided relief. Patient is irritable when requests are denied to get her a soda from the cafeteria not during meal times but otherwise is cooperative. She is seen in the milieu interacting briefly with peers and is attending some groups. She continues to use her brace on left forearm and wheelchair for ambulation. No distress noted. Q15 minute checks are maintained for safety.

## 2015-11-04 NOTE — Progress Notes (Signed)
Recreation Therapy Notes  Date: 05.22.2017 Time: 9:30am Location: [address] Group Room   Group Topic: Stress Management  Goal Area(s) Addresses:  Patient will actively participate in stress management techniques presented during session.   Behavioral Response: Did not attend.   Yussuf Sawyers L Horton [place], LRT/CTRS        Ludy Messamore L 11/04/2015 12:01 PM 

## 2015-11-05 LAB — PHENYTOIN LEVEL, TOTAL

## 2015-11-05 LAB — VALPROIC ACID LEVEL: VALPROIC ACID LVL: 25 ug/mL — AB (ref 50.0–100.0)

## 2015-11-05 MED ORDER — HYDROXYZINE HCL 25 MG PO TABS: 25.0000 mg | ORAL_TABLET | Freq: Four times a day (QID) | ORAL | Status: DC | PRN

## 2015-11-05 MED ORDER — DIVALPROEX SODIUM ER 500 MG PO TB24: 500.0000 mg | ORAL_TABLET | Freq: Every day | ORAL | Status: DC

## 2015-11-05 MED ORDER — PHENYTOIN SODIUM EXTENDED 100 MG PO CAPS: 100.0000 mg | ORAL_CAPSULE | Freq: Three times a day (TID) | ORAL | Status: DC

## 2015-11-05 MED ORDER — VENLAFAXINE HCL ER 37.5 MG PO CP24: 112.5000 mg | ORAL_CAPSULE | Freq: Every day | ORAL | Status: DC

## 2015-11-05 MED ORDER — DIVALPROEX SODIUM ER 250 MG PO TB24
750.0000 mg | ORAL_TABLET | Freq: Every day | ORAL | Status: DC
  Filled 2015-11-05 (×2): qty 21

## 2015-11-05 MED ORDER — ONDANSETRON 4 MG PO TBDP: ORAL_TABLET | ORAL | Status: DC

## 2015-11-05 MED ORDER — DICLOFENAC SODIUM 1 % TD GEL: 2.0000 g | Freq: Four times a day (QID) | TRANSDERMAL | Status: DC

## 2015-11-05 MED ORDER — NAPROXEN 500 MG PO TABS: 500.0000 mg | ORAL_TABLET | Freq: Two times a day (BID) | ORAL | Status: DC

## 2015-11-05 MED ORDER — PHENYTOIN SODIUM EXTENDED 100 MG PO CAPS
100.0000 mg | ORAL_CAPSULE | Freq: Three times a day (TID) | ORAL | Status: DC
  Filled 2015-11-05 (×3): qty 21

## 2015-11-05 MED ORDER — DIVALPROEX SODIUM ER 250 MG PO TB24: 750.0000 mg | ORAL_TABLET | Freq: Every day | ORAL | Status: DC

## 2015-11-05 MED ORDER — MIRTAZAPINE 7.5 MG PO TABS: 7.5000 mg | ORAL_TABLET | Freq: Every day | ORAL | Status: DC

## 2015-11-05 MED ORDER — ALBUTEROL SULFATE HFA 108 (90 BASE) MCG/ACT IN AERS: 2.0000 | INHALATION_SPRAY | Freq: Four times a day (QID) | RESPIRATORY_TRACT | Status: DC | PRN

## 2015-11-05 MED ORDER — DIVALPROEX SODIUM ER 250 MG PO TB24
750.0000 mg | ORAL_TABLET | Freq: Every day | ORAL | Status: DC
Start: 2015-11-05 — End: 2015-11-05
  Filled 2015-11-05: qty 21

## 2015-11-05 MED ORDER — PHENYTOIN SODIUM EXTENDED 100 MG PO CAPS: 100.0000 mg | ORAL_CAPSULE | Freq: Two times a day (BID) | ORAL | Status: DC

## 2015-11-05 NOTE — Progress Notes (Signed)
Patient ID: Patricia Drake, female   DOB: [birth year], 30 y.o.   MRN: [id number]  Adult Psychoeducational Group Note  Date:  11/05/2015 Time:  08:55am   Group Topic/Focus:  Overcoming Stress:   The focus of this group is to define stress and help patients assess their triggers.  Participation Level:  Minimal  Participation Quality:  Inattentive  Affect:  Flat  Cognitive:  Alert and Oriented  Insight: Improving  Engagement in Group:  Lacking  Modes of Intervention:  Activity, Education and Support  Additional Comments:  Pt reports no depression and anxiety. Pt minimally participates in deep breathing and guided imagery activity.   Patricia Drake, Patricia Drake 11/05/2015, 5:52 PM

## 2015-11-05 NOTE — Discharge Summary (Signed)
Physician Discharge Summary Note  Patient:  Patricia Drake is an 30 y.o., female MRN:  [id number] DOB:  03-20-1986 Patient phone:  [phone] (home)  Patient address:   [address] [place] [address],  Total Time spent with patient: Greater than 30 minutes  Date of Admission:  10/30/2015  Date of Discharge: 11-05-15  Reason for Admission: Worsening symptoms of depression.  Principal Problem: Major depressive disorder, recurrent episode [facility])  Discharge Diagnoses: Patient Active Problem List   Diagnosis Date Noted  . Severe episode of recurrent major depressive disorder, without psychotic features ([facility]) [F33.2]   . Major depressive disorder, recurrent episode ([facility]) [F33.9] 10/30/2015  . Precordial chest pain [R07.2] 10/29/2015  . Overdose [T50.901A] 10/28/2015  . Intentional overdose of drug in tablet form ([facility]) [T50.902A] 10/28/2015  . Leukocytosis [D72.829] 10/28/2015  . Drug overdose [T50.901A]   . Hypotension due to drugs [I95.2]   . Left wrist pain [M25.532] 08/08/2015  . Viral gastroenteritis [A08.4] 08/08/2015  . Seizure disorder ([facility]) [G40.909] 08/08/2015  . MDD (major depressive disorder), recurrent severe, without psychosis ([facility]) [F33.2] 06/26/2015  . PTSD (post-traumatic stress disorder) [F43.10] 06/26/2015   Past Psychiatric History: Major depressive disorder  Past Medical History:  Past Medical History  Diagnosis Date  . Seizures ([facility])   . PTSD (post-traumatic stress disorder)   . Depression   . Anxiety   . Chronic diarrhea   . Collapsed lung   . Carpal tunnel syndrome     Past Surgical History  Procedure Laterality Date  . Cesarean section    . Tubal ligation     Family History: History reviewed. No pertinent family history.  Family Psychiatric  History: See H&P  Social History:  History  Alcohol Use No     History  Drug Use  . Yes  . Special: Marijuana    Social History   Social History  . Marital Status: Single     Spouse Name: N/A  . Number of Children: N/A  . Years of Education: N/A   Social History Main Topics  . Smoking status: Former Smoker    Types: Cigarettes  . Smokeless tobacco: Never Used  . Alcohol Use: No  . Drug Use: Yes    Special: Marijuana  . Sexual Activity: Yes    Birth Control/ Protection: Surgical   Other Topics Concern  . None   Social History Narrative   Hospital Course: 30 year old single female, reports recent suicide attempt by overdosing on about 20 Trazodone . States " after I took them I blacked out, and next thing I remember I was in the hospital. I had called 911 after I took them".The attempt was impulsive, not planned out, and states " it was a bad day, my kids did not call me for mother's day, and I had nowhere to stay". States she has been facing significant stressors that are contributing to her depression, to include homelessness, upcoming court dates .  Patricia Drake was admitted to the hospital for worsening symptoms of depression & crisis management due to suicide attempts by an overdose. She cited distanced family relationship issues, housing & legal problems as the trigger. She was in need of mood stabilization treatments.  After her admission assessment, Patricia Drake's presenting symptoms were identified. The medication regimen targeting those symptoms were initiated. She was medicated & discharged on; Effexor XR 37.5 mg for depression, Hydroxyzine 25 mg for anxiety & Depakote ER 250 mg for mood stabilization. Her other pre-existing medical problems were  identified & treated by resuming her pertinent home medications for those health issues. Patricia Drake was enrolled & participated in the group counseling sessions being offered & held on this unit. She learned coping skills..  During the course of her hospitalization, Patricia Drake's improvement was monitored by observation & her daily report of symptom reduction noted.  Her emotional & mental status were monitored by daily  self-inventory reports completed by her & the clinical staff. She was evaluated by the treatment team for mood stability and plans for continued recovery after discharge. Patricia Drake's motivation was an integral factor in her mood stability. She was offered further treatment options upon discharge as noted below.   Upon discharge, Patricia Drake was both mentally & medically stable. She denies suicidal/homicidal ideations, auditory/visual/tactile hallucinations, delusional thoughts or paranoia. She received from the pharmacy, a 7 days worth, [name] belongings in no distress. Transportation per sponsor.   Associated Signs/Symptoms:  Physical Findings: AIMS: Facial and Oral Movements Muscles of Facial Expression: None, normal Lips and Perioral Area: None, normal Jaw: None, normal Tongue: None, normal,Extremity Movements Upper (arms, wrists, hands, fingers): None, normal Lower (legs, knees, ankles, toes): None, normal, Trunk Movements Neck, shoulders, hips: None, normal, Overall Severity Severity of abnormal movements (highest score from questions above): None, normal Incapacitation due to abnormal movements: None, normal Patient's awareness of abnormal movements (rate only patient's report): No Awareness, Dental Status Current problems with teeth and/or dentures?: Yes Does patient usually wear dentures?: No  CIWA:    COWS:     Musculoskeletal: Strength & Muscle Tone: within normal limits Gait & Station: normal Patient leans: N/A  Psychiatric Specialty Exam: Physical Exam  Constitutional: She is oriented to person, place, and time. She appears well-developed.  HENT:  Head: Normocephalic.  Eyes: Pupils are equal, round, and reactive to light.  Neck: Normal range of motion.  Cardiovascular: Normal rate.   Respiratory: Effort normal.  GI: Soft.  Genitourinary:  Denies any issues in this area  Musculoskeletal: Normal range of  motion.  Neurological: She is alert and oriented to person, place, and time.  Skin: Skin is warm and dry.    Review of Systems  Constitutional: Negative.   HENT: Negative.   Eyes: Negative.   Respiratory: Negative.   Cardiovascular: Negative.   Gastrointestinal: Negative.   Genitourinary: Negative.   Musculoskeletal: Negative.   Skin: Negative.   Neurological: Negative.   Endo/Heme/Allergies: Negative.   Psychiatric/Behavioral: Positive for depression (Stable). Negative for suicidal ideas, hallucinations, memory loss and substance abuse. The patient has insomnia (Stable). The patient is not nervous/anxious.     Blood pressure 92/46, pulse 70, temperature 98.7 F (37.1 C), temperature source Oral, resp. rate 16, height 5\' 2"  (1.575 m), weight 98.431 kg (217 lb), last menstrual period 10/20/2015, SpO2 99 %.Body mass index is 39.68 kg/(m^2).  See Md's SRA   Have you used any form of tobacco in the last 30 days? (Cigarettes, Smokeless Tobacco, Cigars, and/or Pipes): Yes  Has this patient used any form of tobacco in the last 30 days? (Cigarettes, Smokeless Tobacco, Cigars, and/or Pipes): No  Blood Alcohol level:  Lab Results  Component Value Date   [facility] 10/27/2015   ETH <5 06/25/2015   Metabolic Disorder Labs:  Lab Results  Component Value Date   HGBA1C 5.6 10/30/2015   MPG 114 10/30/2015   No results found for: PROLACTIN Lab Results  Component Value Date   CHOL 146 10/30/2015   TRIG  200* 10/30/2015   HDL 40* 10/30/2015   CHOLHDL 3.7 10/30/2015   VLDL 40 10/30/2015   LDLCALC 66 10/30/2015   See Psychiatric Specialty Exam and Suicide Risk Assessment completed by Attending Physician prior to discharge.  Discharge destination:  Home  Is patient on multiple antipsychotic therapies at discharge:  No   Has Patient had three or more failed trials of antipsychotic monotherapy by history:  No  Recommended Plan for Multiple Antipsychotic Therapies: NA    Medication List     STOP taking these medications        dicyclomine 20 MG tablet  Commonly known as:  BENTYL     sertraline 100 MG tablet  Commonly known as:  ZOLOFT     traZODone 100 MG tablet  Commonly known as:  DESYREL     Wrist Splint/Left Medium Misc      TAKE these medications      Indication   albuterol 108 (90 Base) MCG/ACT inhaler  Commonly known as:  PROVENTIL HFA;VENTOLIN HFA  Inhale 2 puffs into the lungs every 6 (six) hours as needed for wheezing or shortness of breath.   Indication:  Asthma     diclofenac sodium 1 % Gel  Commonly known as:  VOLTAREN  Apply 2 g topically 4 (four) times daily. For arthritis   Indication:  Joint Damage causing Pain and Loss of Function     divalproex 250 MG 24 hr tablet  Commonly known as:  DEPAKOTE ER  Take 3 tablets (750 mg total) by mouth at bedtime. For mood stabilization   Indication:  Mood stabilization     hydrOXYzine 25 MG tablet  Commonly known as:  ATARAX/VISTARIL  Take 1 tablet (25 mg total) by mouth every 6 (six) hours as needed for anxiety.   Indication:  Anxiety Neurosis     mirtazapine 7.5 MG tablet  Commonly known as:  REMERON  Take 1 tablet (7.5 mg total) by mouth at bedtime. For insomnia   Indication:  Trouble Sleeping     naproxen 500 MG tablet  Commonly known as:  NAPROSYN  Take 1 tablet (500 mg total) by mouth 2 (two) times daily with a meal. For pain   Indication:  Mild to Moderate Pain     ondansetron 4 MG disintegrating tablet  Commonly known as:  ZOFRAN ODT  4mg  ODT q4 hours prn: For nausea/vomiting   Indication:  Nausea, vomiting     phenytoin 100 MG ER capsule  Commonly known as:  DILANTIN  Take 1 capsule (100 mg total) by mouth 3 (three) times daily. For seizures   Indication:  Seizure     venlafaxine XR 37.5 MG 24 hr capsule  Commonly known as:  EFFEXOR-XR  Take 3 capsules (112.5 mg total) by mouth daily with breakfast. For depression   Indication:  Major Depressive Disorder       Follow-up  Information    Follow up with [facility] On 11/05/2015.   Why:  at 12:00pm for medication management.   Contact information:   [address] [address] [phone]      Follow up with Pt will schedule her own therapy through her outpatient provider.     Follow-up recommendations: Activity:  As tolerated Diet: As recommended by your primary care doctor. Keep all scheduled follow-up appointments as recommended.   Comments: Take all your medications as prescribed by your mental healthcare provider. Report any adverse effects and or reactions from your medicines to your  outpatient provider promptly. Patient is instructed and cautioned to not engage in alcohol and or illegal drug use while on prescription medicines. In the event of worsening symptoms, patient is instructed to call the crisis hotline, 911 and or go to the nearest ED for appropriate evaluation and treatment of symptoms. Follow-up with your primary care provider for your other medical issues, concerns and or health care needs.  Signed: Sanjuana Kava, NP, PMHNP-BC 11/05/2015, 10:55 AM   Patient seen, Suicide Assessment Completed.  Disposition Plan Reviewed

## 2015-11-05 NOTE — Progress Notes (Signed)
D: Pt A & O X 4. Presents with blunt affect and irritable / labile mood related to d/c request. Pt denies SI, HI, AVH and pain at this time. D/C home as per MD's orders. Pt was picked up in the main lobby by a female visitor.  A: Medications administered as per MD's orders. D/C instructions reviewed with pt including outpatient appointment and medications. All belongings in lockers 5 & 8 returned to pt prior to departure from facility. Q 15 minutes checks maintained for safety as ordered without self harm gestures to note at this time.  R: Pt receptive to care. Compliant with medications as ordered. Denies adverse drug reactions when assessed. Verbalized understanding related to d/c instructions. No physical distress evident at time of d/c.

## 2015-11-05 NOTE — Progress Notes (Signed)
  [facility] :  Will you be returning to the same living situation after discharge:  No, Pt plans to stay with father temporarily until a bed [facility] available At discharge, do you have transportation home?: Yes,  Pt's sponsor from [employer] will provide transportation Do you have the ability to pay for your medications: Yes,  Pt provided with samples and prescriptions  Release of information consent forms completed and in the chart;  Patient's signature needed at discharge.  Patient to Follow up at: Follow-up Information    Follow up with [facility] On 11/05/2015.   Why:  at 12:00pm for medication management.   Contact information:   [address] [place] [address] [phone]      Follow up with Pt will schedule her own therapy through her outpatient provider.      Next level of care provider [facility] Link:no  Safety Planning and Suicide Prevention discussed: Yes,  with Patient  Have you used any form of tobacco in the last 30 days? (Cigarettes, Smokeless Tobacco, Cigars, and/or Pipes): Yes  Has patient been referred to the Quitline?: Patient refused referral  Patient has been referred for addiction treatment: Pt. refused referral  Elaina HoopsCarter, Ashaki Frosch M 11/05/2015, 10:24 AM

## 2015-11-05 NOTE — [facility] Suicide Risk Assessment (Addendum)
[facility] Discharge Suicide Risk Assessment   Principal Problem: Major depressive disorder, recurrent episode [facility]) Discharge Diagnoses:  Patient Active Problem List   Diagnosis Date Noted  . Severe episode of recurrent major depressive disorder, without psychotic features (HCC) [F33.2]   . Major depressive disorder, recurrent episode (HCC) [F33.9] 10/30/2015  . Precordial chest pain [R07.2] 10/29/2015  . Overdose [T50.901A] 10/28/2015  . Intentional overdose of drug in tablet form (HCC) [T50.902A] 10/28/2015  . Leukocytosis [D72.829] 10/28/2015  . Drug overdose [T50.901A]   . Hypotension due to drugs [I95.2]   . Left wrist pain [M25.532] 08/08/2015  . Viral gastroenteritis [A08.4] 08/08/2015  . Seizure disorder (HCC) [G40.909] 08/08/2015  . MDD (major depressive disorder), recurrent severe, without psychosis (HCC) [F33.2] 06/26/2015  . PTSD (post-traumatic stress disorder) [F43.10] 06/26/2015    Total Time spent with patient: 30 minutes  Musculoskeletal: Strength & Muscle Tone: within normal limits Gait & Station: normal Patient leans: N/A  Psychiatric Specialty Exam: ROS  Blood pressure 92/46, pulse 70, temperature 98.7 F (37.1 C), temperature source Oral, resp. rate 16, height  (1.575 m), weight 217 lb (98.431 kg), last menstrual period 10/20/2015, SpO2 99 %.Body mass index is 39.68 kg/(m^2).  General Appearance: Fairly Groomed  Patent attorney::  Good  Speech:  Normal Rate409  Volume:  Normal  Mood:  improved  Affect:  improved, more reactive   Thought Process:  Linear  Orientation:  Full (Time, Place, and Person)  Thought Content:  denies hallucinations, no delusions   Suicidal Thoughts:  No- denies suicidal ideations, no self injurious ideations,  no homicidal ideations, no thoughts of violence   Homicidal Thoughts:  No  Memory:  recent and remote grossly intact   Judgement:  Other:  improved   Insight:  improved   Psychomotor Activity:  Normal  Concentration:  Good   Recall:  Good  Fund of Knowledge:Good  Language: Good  Akathisia:  Negative  Handed:  Right  AIMS (if indicated):     Assets:  Communication Skills Desire for Improvement Resilience  Sleep:  Number of Hours: 6.75  Cognition: WNL  ADL's:  Intact   Mental Status Per Nursing Assessment::   On Admission:  NA  Demographic Factors:  30 year old female, has three children, live with family members, currently homeless   Loss Factors: Homelessness, limited support network, history of seizure disorder   Historical Factors: History of depression, history of PTSD, history of cannabis abuse, remote history of methamphetamine abuse   Risk Reduction Factors:   Sense of responsibility to family and Positive coping skills or problem solving skills  Continued Clinical Symptoms:  At this time patient is improved compared to admission.  Mood improved , affect more reactive, denies any SI or HI, no psychotic symptoms and is future oriented, planning on going to [facility] , which is a residential long term program . Denies medication side effects at this time. No seizures during admission- tolerating Dilantin and Depakote ER well - serum levels low- will increase doses to Dilantin 100 mgrs TID and to Depakote ER 750 mgrs QHS   Cognitive Features That Contribute To Risk:  No gross cognitive deficits noted upon discharge. Is alert , attentive, and oriented x 3   Suicide Risk:  Mild:  Suicidal ideation of limited frequency, intensity, duration, and specificity.  There are no identifiable plans, no associated intent, mild dysphoria and related symptoms, good self-control (both objective and subjective assessment), few other risk factors, and identifiable protective factors, including available  and accessible social support.  Follow-up Information    Follow up with [facility] On 11/05/2015.   Why:  at 12:00pm for medication management.   Contact information:   [address]  [place] [id number] [phone]      Follow up with Pt will schedule her own therapy through her outpatient provider.      Plan Of Care/Follow-up recommendations:  Activity:  as toloerated Diet:  Regular Tests:  NA Other:  See below  Patient is leaving in good spirits . Plans to stay with father until she [facility] /[facility] program [facility] where she states she also gets treatment for seizure disorder  Nehemiah MassedOBOS, Patricia Kadar, MD 11/05/2015, 10:28 AM

## 2015-11-05 NOTE — Progress Notes (Signed)
  D: Pt appeared to be slightly anxious. Requested to see the writer, however, didn't appear to want to discuss care, rushed writer to give meds instead. Informed the writer that she may be discharged tomorrow. States she plans to go to her dad's house for 2 wks, then [facility]. Pt has no questions or concerns.   A:  Support and encouragement was offered. 15 min checks continued for safety.  R: Pt remains safe.

## 2015-11-05 NOTE — [facility] Suicide Risk Assessment (Signed)
[facility] INPATIENT:  Family/Significant Other Suicide Prevention Education  Suicide Prevention Education:  Patient Refusal for Family/Significant Other Suicide Prevention Education: The patient Patricia Drake has refused to provide written consent for family/significant other to be provided Family/Significant Other Suicide Prevention Education during admission and/or prior to discharge.  Physician notified. SPE reviewed with patient and brochure provided. Patient encouraged to return to hospital if having suicidal thoughts, patient verbalized his/her understanding and has no further questions at this time.   Elaina HoopsCarter, Suzi Hernan M 11/05/2015, 11:33 AM

## 2015-11-05 NOTE — Tx Team (Signed)
Interdisciplinary Treatment Plan Update (Adult) Date: 11/05/2015   Date: 11/05/2015 8:48 AM  Progress in Treatment:  Attending groups: Intermittently Participating in groups: Yes, when she attends   Taking medication as prescribed: Yes  Tolerating medication: Yes  Family/Significant other contact made: No, Pt declines Patient understands diagnosis: Yes AEB seeking help with depression Discussing patient identified problems/goals with staff: Yes  Medical problems stabilized or resolved: Yes  Denies suicidal/homicidal ideation: Yes, denies Patient has not harmed self or Others: Yes   New problem(s) identified: None identified at this time.   Discharge Plan or Barriers: Patient plans to stay with her father to follow up with outpatient services.    Additional comments:  Patient and CSW reviewed pt's identified goals and treatment plan. Patient verbalized understanding and agreed to treatment plan. CSW reviewed [facility] "Discharge Process and Patient Involvement" Form. Pt verbalized understanding of information provided and signed form.   Reason for Continuation of Hospitalization:  Anxiety Depression Medication stabilization Suicidal ideation  Estimated length of stay: Discharge anticipated for today 11/05/15  Review of initial/current patient goals per problem list:   1.  Goal(s): Patient will participate in aftercare plan  Met:  Yes  Target date: 3-5 days from date of admission   As evidenced by: Patient will participate within aftercare plan AEB aftercare provider and housing plan at discharge being identified.  10/31/15: CSW to work with Pt to assess for appropriate discharge plan and faciliate appointments and referrals as needed prior to d/c. 5/23: Goal met. Patient plans to discharge to home with father to follow up with outpatient services.   2.  Goal (s): Patient will exhibit decreased depressive symptoms and suicidal ideations.  Met:  Adequate for discharge per MD  Target  date: 3-5 days from date of admission   As evidenced by: Patient will utilize self rating of depression at 3 or below and demonstrate decreased signs of depression or be deemed stable for discharge by MD. 10/31/15: Pt was admitted with symptoms of depression, rating 10/10. Pt continues to present with flat affect and depressive symptoms.  Pt will demonstrate decreased symptoms of depression and rate depression at 3/10 or lower prior to discharge. 5/22: Adequate for discharge per MD. Patient rates depression at 4, denies SI.   3.  Goal(s): Patient will demonstrate decreased signs and symptoms of anxiety.  Met:  Adequate for discharge per MD.  Target date: 3-5 days from date of admission   As evidenced by: Patient will utilize self rating of anxiety at 3 or below and demonstrated decreased signs of anxiety, or be deemed stable for discharge by MD 10/31/15: Pt was admitted with increased levels of anxiety and is currently rating those symptoms highly. Pt will demonstrated decreased symptoms of anxiety and rate it at 3/10 prior to d/c. 5/22: Adequate for discharge. Patient rates anxiety at 5.  Attendees: Patient:    Family:    Physician: Dr. Cobos 11/05/2015 9:30 AM  Nursing: Marian Friedman, Jane Ochieng, [place], Sara Twyman, RN 11/05/2015 9:30 AM  Clinical Social Worker:  , LCSW 11/05/2015 9:30 AM  Other: Lauren Carter, LCSWA; Heather Smart, LCSW  11/05/2015 9:30 AM  Other:  11/05/2015 9:30 AM  Other: Jennifer Clark, Case Manager 11/05/2015 9:30 AM  Other: May Augustin, Aggie Nwoko, NP 11/05/2015 9:30 AM  Other:       , LCSW Clinical Social Worker [facility] [phone]      

## 2015-12-17 ENCOUNTER — Encounter (HOSPITAL_COMMUNITY): Payer: Self-pay | Admitting: *Deleted

## 2015-12-17 ENCOUNTER — Emergency Department (HOSPITAL_COMMUNITY)
Admission: EM | Admit: 2015-12-17 | Discharge: 2015-12-17 | Disposition: A | Discharge status: Home / Self Care | Payer: Self-pay | Attending: Emergency Medicine | Admitting: Emergency Medicine

## 2015-12-17 DIAGNOSIS — F329 Major depressive disorder, single episode, unspecified: Secondary | ICD-10-CM | POA: Insufficient documentation

## 2015-12-17 DIAGNOSIS — G40909 Epilepsy, unspecified, not intractable, without status epilepticus: Secondary | ICD-10-CM | POA: Insufficient documentation

## 2015-12-17 DIAGNOSIS — Z79899 Other long term (current) drug therapy: Secondary | ICD-10-CM | POA: Insufficient documentation

## 2015-12-17 DIAGNOSIS — Z87891 Personal history of nicotine dependence: Secondary | ICD-10-CM | POA: Insufficient documentation

## 2015-12-17 DIAGNOSIS — R569 Unspecified convulsions: Secondary | ICD-10-CM

## 2015-12-17 HISTORY — DX: Accidental discharge from unspecified firearms or gun, initial encounter: W34.00XA

## 2015-12-17 LAB — COMPREHENSIVE METABOLIC PANEL
ALBUMIN: 3.7 g/dL (ref 3.5–5.0)
ALT: 12 U/L — ABNORMAL LOW (ref 14–54)
ANION GAP: 5 (ref 5–15)
AST: 16 U/L (ref 15–41)
Alkaline Phosphatase: 70 U/L (ref 38–126)
BILIRUBIN TOTAL: 0.2 mg/dL — AB (ref 0.3–1.2)
BUN: 10 mg/dL (ref 6–20)
CO2: 25 mmol/L (ref 22–32)
Calcium: 8.5 mg/dL — ABNORMAL LOW (ref 8.9–10.3)
Chloride: 104 mmol/L (ref 101–111)
Creatinine, Ser: 0.51 mg/dL (ref 0.44–1.00)
GFR calc non Af Amer: >60 mL/min (ref >60)
GLUCOSE: 97 mg/dL (ref 65–99)
POTASSIUM: 3.8 mmol/L (ref 3.5–5.1)
SODIUM: 134 mmol/L — AB (ref 135–145)
TOTAL PROTEIN: 7.1 g/dL (ref 6.5–8.1)

## 2015-12-17 LAB — URINALYSIS, ROUTINE W REFLEX MICROSCOPIC
BILIRUBIN URINE: NEGATIVE
GLUCOSE, UA: NEGATIVE mg/dL
HGB URINE DIPSTICK: NEGATIVE
KETONES UR: NEGATIVE mg/dL
Nitrite: NEGATIVE
PH: 6 (ref 5.0–8.0)
PROTEIN: NEGATIVE mg/dL
Specific Gravity, Urine: 1.036 — ABNORMAL HIGH (ref 1.005–1.030)

## 2015-12-17 LAB — URINE MICROSCOPIC-ADD ON

## 2015-12-17 LAB — CBC WITH DIFFERENTIAL/PLATELET
BASOS PCT: 1 %
Basophils Absolute: 0.1 10*3/uL (ref 0.0–0.1)
EOS ABS: 0.4 10*3/uL (ref 0.0–0.7)
EOS PCT: 5 %
HCT: 35.2 % — ABNORMAL LOW (ref 36.0–46.0)
Hemoglobin: 12 g/dL (ref 12.0–15.0)
Lymphocytes Relative: 34 %
Lymphs Abs: 2.5 10*3/uL (ref 0.7–4.0)
MCH: 31 pg (ref 26.0–34.0)
MCHC: 34.1 g/dL (ref 30.0–36.0)
MCV: 91 fL (ref 78.0–100.0)
MONO ABS: 0.5 10*3/uL (ref 0.1–1.0)
MONOS PCT: 7 %
Neutro Abs: 4 10*3/uL (ref 1.7–7.7)
Neutrophils Relative %: 53 %
Platelets: 379 10*3/uL (ref 150–400)
RBC: 3.87 MIL/uL (ref 3.87–5.11)
RDW: 13.1 % (ref 11.5–15.5)
WBC: 7.6 10*3/uL (ref 4.0–10.5)

## 2015-12-17 LAB — VALPROIC ACID LEVEL

## 2015-12-17 LAB — PREGNANCY, URINE: Preg Test, Ur: NEGATIVE

## 2015-12-17 LAB — PHENYTOIN LEVEL, TOTAL

## 2015-12-17 MED ORDER — PHENYTOIN SODIUM EXTENDED 100 MG PO CAPS
100.0000 mg | ORAL_CAPSULE | Freq: Once | ORAL | Status: DC
  Filled 2015-12-17: qty 1

## 2015-12-17 MED ORDER — DIVALPROEX SODIUM 250 MG PO DR TAB
750.0000 mg | DELAYED_RELEASE_TABLET | Freq: Once | ORAL | Status: AC
  Administered 2015-12-17: 750 mg via ORAL
  Filled 2015-12-17: qty 1

## 2015-12-17 MED ORDER — SODIUM CHLORIDE 0.9 % IV SOLN
1000.0000 mg | Freq: Once | INTRAVENOUS | Status: AC
  Administered 2015-12-17: 1000 mg via INTRAVENOUS
  Filled 2015-12-17: qty 20
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
## 2015-12-17 NOTE — ED Notes (Signed)
Per [facility], pt stated she felt like she was going to have a sz, felt like she had a weird taste in her mouth.  Claims she had a sz earlier today.  Sees someone for her sz's.

## 2015-12-17 NOTE — ED Notes (Signed)
Pt placed on continuous pulse oximetry.

## 2015-12-17 NOTE — ED Notes (Signed)
Bed: ON62WA12 Expected date:  Expected time:  Means of arrival:  Comments: 30 yo- feels like she is going to have a seizure

## 2015-12-17 NOTE — Discharge Instructions (Signed)
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
## 2015-12-24 ENCOUNTER — Emergency Department (HOSPITAL_COMMUNITY)
Admission: EM | Admit: 2015-12-24 | Discharge: 2015-12-24 | Disposition: A | Discharge status: Home / Self Care | Payer: Self-pay | Attending: Emergency Medicine | Admitting: Emergency Medicine

## 2015-12-24 ENCOUNTER — Encounter (HOSPITAL_COMMUNITY): Payer: Self-pay | Admitting: *Deleted

## 2015-12-24 DIAGNOSIS — Z79899 Other long term (current) drug therapy: Secondary | ICD-10-CM | POA: Insufficient documentation

## 2015-12-24 DIAGNOSIS — Z87891 Personal history of nicotine dependence: Secondary | ICD-10-CM | POA: Insufficient documentation

## 2015-12-24 DIAGNOSIS — F329 Major depressive disorder, single episode, unspecified: Secondary | ICD-10-CM | POA: Insufficient documentation

## 2015-12-24 DIAGNOSIS — Z7951 Long term (current) use of inhaled steroids: Secondary | ICD-10-CM | POA: Insufficient documentation

## 2015-12-24 DIAGNOSIS — R569 Unspecified convulsions: Secondary | ICD-10-CM | POA: Insufficient documentation

## 2015-12-24 LAB — BASIC METABOLIC PANEL
Anion gap: 5 (ref 5–15)
BUN: 8 mg/dL (ref 6–20)
CHLORIDE: 107 mmol/L (ref 101–111)
CO2: 25 mmol/L (ref 22–32)
CREATININE: 0.48 mg/dL (ref 0.44–1.00)
Calcium: 8.5 mg/dL — ABNORMAL LOW (ref 8.9–10.3)
GFR calc Af Amer: >60 mL/min (ref >60)
GFR calc non Af Amer: >60 mL/min (ref >60)
GLUCOSE: 79 mg/dL (ref 65–99)
Potassium: 3.8 mmol/L (ref 3.5–5.1)
Sodium: 137 mmol/L (ref 135–145)

## 2015-12-24 LAB — VALPROIC ACID LEVEL

## 2015-12-24 LAB — CBG MONITORING, ED: GLUCOSE-CAPILLARY: 82 mg/dL (ref 65–99)

## 2015-12-24 LAB — PHENYTOIN LEVEL, TOTAL: Phenytoin Lvl: <2.5 ug/mL — ABNORMAL LOW (ref 10.0–20.0)

## 2015-12-24 MED ORDER — DIVALPROEX SODIUM 500 MG PO DR TAB: 750.0000 mg | DELAYED_RELEASE_TABLET | Freq: Every day | ORAL | Status: DC

## 2015-12-24 MED ORDER — PHENYTOIN SODIUM 50 MG/ML IJ SOLN
1000.0000 mg | INTRAMUSCULAR | Status: AC
  Administered 2015-12-24: 1000 mg via INTRAVENOUS
  Filled 2015-12-24: qty 20

## 2015-12-24 MED ORDER — PHENYTOIN SODIUM EXTENDED 100 MG PO CAPS: 100.0000 mg | ORAL_CAPSULE | Freq: Three times a day (TID) | ORAL | Status: DC
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
## 2015-12-24 NOTE — ED Notes (Signed)
Per EMS - patient comes from court house with "stress induced seizures."  Patient states she felt like she was going to have a seizure.  Bystanders state patient sat down, "threw her head back, her eyes rolled back in her head."  Patient was conscious, alert & oriented upon EMS arrival.  Patient denies trauma to mouth or face and no urinary incontinence was noted.  Vitals on scene, 113/57, HR 64, 28 RR, BG 99.

## 2015-12-24 NOTE — Progress Notes (Addendum)
CM spoke with [place] who [place]  CM discussed and provided written information to assist pt with determining choice for uninsured accepting pcps, discussed the importance of pcp vs EDP services for f/u care, www.needymeds.org, www.goodrx.com, discounted pharmacies and other [place] resources such as [facility] , [facility], affordable care act, financial assistance, uninsured dental services, [place] med assist, DSS and  health department  Reviewed resources for [place] uninsured accepting pcps like [facility], [facility] medicine [facility] [address], [facility], [facility], local urgent care centers, [facility], [facility], general medical clinics, [facility] services of the [place], [facility] plus others, medication resources, [facility] out patient pharmacies and housing Pt voiced understanding and appreciation of resources provided   Provided [facility] contact information Pt confirms she [facility] Psychologist[facility] sport and exercisemary [facility] and saw [employer] [facility] on 12/23/15  ED CM made maryann placey aware that pt had a seizure and [facility]   Entered in d/c instructions  Please use the resources provided to you in emergency room by case manager to assist you're your choice of doctor for follow up These [place] uninsured resources provide possible primary care providers, resources for discounted medications, housing, dental resources, affordable care act information, plus other resources for [place]

## 2015-12-24 NOTE — ED Notes (Signed)
Chris from lab states he has the blood for the BMP and will run the blood.

## 2015-12-24 NOTE — Discharge Instructions (Signed)
You have been seen in the emergency department today for a seizure.  Your workup today including labs are within normal limits.  Please follow up with your doctor/neurologist as soon as possible regarding today's emergency department visit and your likely seizure. Epilepsy Epilepsy is a disorder in which a person has repeated seizures over time. A seizure is a release of abnormal electrical activity in the brain. Seizures can cause a change in attention, behavior, or the ability to remain awake and alert (altered mental status). Seizures often involve uncontrollable shaking (convulsions).  Most people with epilepsy lead normal lives. However, people with epilepsy are at an increased risk of falls, accidents, and injuries. Therefore, it is important to begin treatment right away. CAUSES  Epilepsy has many possible causes. Anything that disturbs the normal pattern of brain cell activity can lead to seizures. This may include:   Head injury.  Birth trauma.  High fever as a child.  Stroke.  Bleeding into or around the brain.  Certain drugs.  Prolonged low oxygen, such as what occurs after CPR efforts.  Abnormal brain development.  Certain illnesses, such as meningitis, encephalitis (brain infection), malaria, and other infections.  An imbalance of nerve signaling chemicals (neurotransmitters).  SIGNS AND SYMPTOMS  The symptoms of a seizure can vary greatly from one person to another. Right before a seizure, you may have a warning (aura) that a seizure is about to occur. An aura may include the following symptoms:  Fear or anxiety.  Nausea.  Feeling like the room is spinning (vertigo).  Vision changes, such as seeing flashing lights or spots. Common symptoms during a seizure include:  Abnormal sensations, such as an abnormal smell or a bitter taste in the mouth.   Sudden, general body stiffness.   Convulsions that involve rhythmic jerking of the face, arm, or leg on one or  both sides.   Sudden change in consciousness.   Appearing to be awake but not responding.   Appearing to be asleep but cannot be awakened.   Grimacing, chewing, lip smacking, drooling, tongue biting, or loss of bowel or bladder control. After a seizure, you may feel sleepy for a while. DIAGNOSIS  Your health care provider will ask about your symptoms and take a medical history. Descriptions from any witnesses to your seizures will be very helpful in the diagnosis. A physical exam, including a detailed neurological exam, is necessary. Various tests may be done, such as:   An electroencephalogram (EEG). This is a painless test of your brain waves. In this test, a diagram is created of your brain waves. These diagrams can be interpreted by a specialist.  An MRI of the brain.   A CT scan of the brain.   A spinal tap (lumbar puncture, LP).  Blood tests to check for signs of infection or abnormal blood chemistry. TREATMENT  There is no cure for epilepsy, but it is generally treatable. Once epilepsy is diagnosed, it is important to begin treatment as soon as possible. For most people with epilepsy, seizures can be controlled with medicines. The following may also be used:  A pacemaker for the brain (vagus nerve stimulator) can be used for people with seizures that are not well controlled by medicine.  Surgery on the brain. For some people, epilepsy eventually goes away. HOME CARE INSTRUCTIONS   Follow your health care provider's recommendations on driving and safety in normal activities.  Get enough rest. Lack of sleep can cause seizures.  Only take over-the-counter or prescription  medicines as directed by your health care provider. Take any prescribed medicine exactly as directed.  Avoid any known triggers of your seizures.  Keep a seizure diary. Record what you recall about any seizure, especially any possible trigger.   Make sure the people you live and work with know  that you are prone to seizures. They should receive instructions on how to help you. In general, a witness to a seizure should:   Cushion your head and body.   Turn you on your side.   Avoid unnecessarily restraining you.   Not place anything inside your mouth.   Call for emergency medical help if there is any question about what has occurred.   Follow up with your health care provider as directed. You may need regular blood tests to monitor the levels of your medicine.  SEEK MEDICAL CARE IF:   You develop signs of infection or other illness. This might increase the risk of a seizure.   You seem to be having more frequent seizures.   Your seizure pattern is changing.  SEEK IMMEDIATE MEDICAL CARE IF:   You have a seizure that does not stop after a few moments.   You have a seizure that causes any difficulty in breathing.   You have a seizure that results in a very severe headache.   You have a seizure that leaves you with the inability to speak or use a part of your body.    This information is not intended to replace advice given to you by your health care provider. Make sure you discuss any questions you have with your health care provider.   Document Released: 06/01/2005 Document Revised: 03/22/2013 Document Reviewed: 01/11/2013 Elsevier Interactive Patient Education [employer]   As we have discussed it is very important that you do not drive until you have been seen and cleared by your neurologist.  Please drink plenty of fluids, get plenty of sleep and avoid any alcohol or drug use Please return to the emergency department if you have any further seizures which do not respond to medications, or for any other symptoms per se concerning for yourself.
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
## 2016-01-07 ENCOUNTER — Encounter (HOSPITAL_COMMUNITY): Payer: Self-pay

## 2016-01-07 ENCOUNTER — Emergency Department (HOSPITAL_COMMUNITY)
Admission: EM | Admit: 2016-01-07 | Discharge: 2016-01-07 | Disposition: A | Discharge status: Home / Self Care | Payer: Self-pay | Attending: Emergency Medicine | Admitting: Emergency Medicine

## 2016-01-07 DIAGNOSIS — Z87891 Personal history of nicotine dependence: Secondary | ICD-10-CM | POA: Insufficient documentation

## 2016-01-07 DIAGNOSIS — Z79899 Other long term (current) drug therapy: Secondary | ICD-10-CM | POA: Insufficient documentation

## 2016-01-07 DIAGNOSIS — R569 Unspecified convulsions: Secondary | ICD-10-CM

## 2016-01-07 DIAGNOSIS — G40909 Epilepsy, unspecified, not intractable, without status epilepticus: Secondary | ICD-10-CM | POA: Insufficient documentation

## 2016-01-07 LAB — CBC WITH DIFFERENTIAL/PLATELET
Basophils Absolute: 0 10*3/uL (ref 0.0–0.1)
Basophils Relative: 0 %
Eosinophils Absolute: 0.2 10*3/uL (ref 0.0–0.7)
Eosinophils Relative: 3 %
HCT: 40.1 % (ref 36.0–46.0)
Hemoglobin: 13.5 g/dL (ref 12.0–15.0)
Lymphocytes Relative: 22 %
Lymphs Abs: 1.6 10*3/uL (ref 0.7–4.0)
MCH: 31 pg (ref 26.0–34.0)
MCHC: 33.7 g/dL (ref 30.0–36.0)
MCV: 92.2 fL (ref 78.0–100.0)
Monocytes Absolute: 0.6 10*3/uL (ref 0.1–1.0)
Monocytes Relative: 8 %
Neutro Abs: 4.9 10*3/uL (ref 1.7–7.7)
Neutrophils Relative %: 67 %
Platelets: 389 10*3/uL (ref 150–400)
RBC: 4.35 MIL/uL (ref 3.87–5.11)
RDW: 13 % (ref 11.5–15.5)
WBC: 7.3 10*3/uL (ref 4.0–10.5)

## 2016-01-07 LAB — BASIC METABOLIC PANEL WITH GFR
Anion gap: 8 (ref 5–15)
BUN: 8 mg/dL (ref 6–20)
CO2: 24 mmol/L (ref 22–32)
Calcium: 8.9 mg/dL (ref 8.9–10.3)
Chloride: 104 mmol/L (ref 101–111)
Creatinine, Ser: 0.63 mg/dL (ref 0.44–1.00)
GFR calc Af Amer: >60 mL/min
GFR calc non Af Amer: >60 mL/min
Glucose, Bld: 86 mg/dL (ref 65–99)
Potassium: 4.1 mmol/L (ref 3.5–5.1)
Sodium: 136 mmol/L (ref 135–145)

## 2016-01-07 LAB — CBG MONITORING, ED: Glucose-Capillary: 91 mg/dL (ref 65–99)

## 2016-01-07 LAB — PHENYTOIN LEVEL, TOTAL: Phenytoin Lvl: 4.6 ug/mL — ABNORMAL LOW (ref 10.0–20.0)

## 2016-01-07 LAB — VALPROIC ACID LEVEL: Valproic Acid Lvl: <10 ug/mL — ABNORMAL LOW (ref 50.0–100.0)

## 2016-01-07 MED ORDER — PHENYTOIN SODIUM EXTENDED 100 MG PO CAPS
400.0000 mg | ORAL_CAPSULE | Freq: Once | ORAL | Status: AC
  Administered 2016-01-07: 400 mg via ORAL
  Filled 2016-01-07: qty 4

## 2016-01-07 MED ORDER — DIVALPROEX SODIUM 250 MG PO DR TAB
500.0000 mg | DELAYED_RELEASE_TABLET | Freq: Once | ORAL | Status: AC
  Administered 2016-01-07: 500 mg via ORAL
  Filled 2016-01-07: qty 2

## 2016-01-07 NOTE — Discharge Instructions (Signed)
The levels of your seizure medications are low. This is likely why you are having seizures. It is very important that you take them consistently as prescribed.

## 2016-01-07 NOTE — ED Notes (Signed)
CBG: 91 RN Notified
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
## 2016-01-07 NOTE — ED Triage Notes (Addendum)
[place] [facility] EMS reports she was [place] today, went to jail and told officers she felt like she was going to have a seizure. EMS states no seizure like activity noted at the jail or with EMS. She states she felt very hot and diaphoretic and states she feels like this when she is about to seize. Hx of seizures and last seizure was Sunday she reports. She states she did not take her Dilantin this morning. Upon speaking with patient, she reports she did have a seizure and woke up on the floor. A&xOx4. BP-104/68, HR-86.

## 2016-01-24 ENCOUNTER — Emergency Department (HOSPITAL_COMMUNITY)
Admission: EM | Admit: 2016-01-24 | Discharge: 2016-01-24 | Disposition: A | Discharge status: Home / Self Care | Payer: Self-pay | Attending: Emergency Medicine | Admitting: Emergency Medicine

## 2016-01-24 ENCOUNTER — Encounter (HOSPITAL_COMMUNITY): Payer: Self-pay | Admitting: Emergency Medicine

## 2016-01-24 DIAGNOSIS — R892 Abnormal level of other drugs, medicaments and biological substances in specimens from other organs, systems and tissues: Secondary | ICD-10-CM | POA: Insufficient documentation

## 2016-01-24 DIAGNOSIS — Z87891 Personal history of nicotine dependence: Secondary | ICD-10-CM | POA: Insufficient documentation

## 2016-01-24 DIAGNOSIS — R51 Headache: Secondary | ICD-10-CM | POA: Insufficient documentation

## 2016-01-24 DIAGNOSIS — R519 Headache, unspecified: Secondary | ICD-10-CM

## 2016-01-24 DIAGNOSIS — Z5181 Encounter for therapeutic drug level monitoring: Secondary | ICD-10-CM

## 2016-01-24 DIAGNOSIS — Z79899 Other long term (current) drug therapy: Secondary | ICD-10-CM | POA: Insufficient documentation

## 2016-01-24 LAB — CBC WITH DIFFERENTIAL/PLATELET
BASOS ABS: 0 10*3/uL (ref 0.0–0.1)
Basophils Relative: 0 %
EOS PCT: 2 %
Eosinophils Absolute: 0.2 10*3/uL (ref 0.0–0.7)
HEMATOCRIT: 38.9 % (ref 36.0–46.0)
Hemoglobin: 13.2 g/dL (ref 12.0–15.0)
LYMPHS ABS: 2 10*3/uL (ref 0.7–4.0)
LYMPHS PCT: 27 %
MCH: 30.9 pg (ref 26.0–34.0)
MCHC: 33.9 g/dL (ref 30.0–36.0)
MCV: 91.1 fL (ref 78.0–100.0)
Monocytes Absolute: 0.7 10*3/uL (ref 0.1–1.0)
Monocytes Relative: 9 %
NEUTROS ABS: 4.6 10*3/uL (ref 1.7–7.7)
Neutrophils Relative %: 62 %
Platelets: 288 10*3/uL (ref 150–400)
RBC: 4.27 MIL/uL (ref 3.87–5.11)
RDW: 12.7 % (ref 11.5–15.5)
WBC: 7.4 10*3/uL (ref 4.0–10.5)

## 2016-01-24 LAB — COMPREHENSIVE METABOLIC PANEL
ALBUMIN: 3.7 g/dL (ref 3.5–5.0)
ALT: 19 U/L (ref 14–54)
ANION GAP: 13 (ref 5–15)
AST: 19 U/L (ref 15–41)
Alkaline Phosphatase: 70 U/L (ref 38–126)
BILIRUBIN TOTAL: 0.6 mg/dL (ref 0.3–1.2)
BUN: 12 mg/dL (ref 6–20)
CHLORIDE: 106 mmol/L (ref 101–111)
CO2: 22 mmol/L (ref 22–32)
Calcium: 8.8 mg/dL — ABNORMAL LOW (ref 8.9–10.3)
Creatinine, Ser: 0.57 mg/dL (ref 0.44–1.00)
GFR calc Af Amer: >60 mL/min (ref >60)
GLUCOSE: 84 mg/dL (ref 65–99)
POTASSIUM: 3.6 mmol/L (ref 3.5–5.1)
Sodium: 141 mmol/L (ref 135–145)
TOTAL PROTEIN: 6.7 g/dL (ref 6.5–8.1)

## 2016-01-24 LAB — URINALYSIS, ROUTINE W REFLEX MICROSCOPIC
Bilirubin Urine: NEGATIVE
GLUCOSE, UA: NEGATIVE mg/dL
Ketones, ur: NEGATIVE mg/dL
Nitrite: NEGATIVE
PROTEIN: NEGATIVE mg/dL
Specific Gravity, Urine: 1.023 (ref 1.005–1.030)
pH: 6.5 (ref 5.0–8.0)

## 2016-01-24 LAB — URINE MICROSCOPIC-ADD ON: BACTERIA UA: NONE SEEN

## 2016-01-24 LAB — I-STAT BETA HCG BLOOD, ED (MC, WL, AP ONLY): I-stat hCG, quantitative: <5 m[IU]/mL (ref <5)

## 2016-01-24 LAB — VALPROIC ACID LEVEL: Valproic Acid Lvl: 12 ug/mL — ABNORMAL LOW (ref 50.0–100.0)

## 2016-01-24 LAB — PHENYTOIN LEVEL, TOTAL: Phenytoin Lvl: <2.5 ug/mL — ABNORMAL LOW (ref 10.0–20.0)

## 2016-01-24 MED ORDER — DIPHENHYDRAMINE HCL 50 MG/ML IJ SOLN
25.0000 mg | Freq: Once | INTRAMUSCULAR | Status: AC
  Administered 2016-01-24: 25 mg via INTRAVENOUS
  Filled 2016-01-24: qty 1

## 2016-01-24 MED ORDER — PHENYTOIN SODIUM EXTENDED 100 MG PO CAPS
300.0000 mg | ORAL_CAPSULE | Freq: Once | ORAL | Status: AC
  Administered 2016-01-24: 300 mg via ORAL
  Filled 2016-01-24: qty 3

## 2016-01-24 MED ORDER — SODIUM CHLORIDE 0.9 % IV BOLUS (SEPSIS)
1000.0000 mL | Freq: Once | INTRAVENOUS | Status: AC
  Administered 2016-01-24: 1000 mL via INTRAVENOUS

## 2016-01-24 MED ORDER — PHENYTOIN SODIUM EXTENDED 100 MG PO CAPS: 100.0000 mg | ORAL_CAPSULE | Freq: Three times a day (TID) | ORAL | 0 refills | Status: DC

## 2016-01-24 MED ORDER — METOCLOPRAMIDE HCL 5 MG/ML IJ SOLN
10.0000 mg | Freq: Once | INTRAMUSCULAR | Status: AC
  Administered 2016-01-24: 10 mg via INTRAVENOUS
  Filled 2016-01-24: qty 2

## 2016-01-24 MED ORDER — DIVALPROEX SODIUM 500 MG PO DR TAB: 1000.0000 mg | DELAYED_RELEASE_TABLET | Freq: Every day | ORAL | 0 refills | Status: DC

## 2016-01-24 NOTE — ED Notes (Signed)
Patient was given some blue paper scrub pants, knit underware and pads.
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
## 2016-01-24 NOTE — ED Notes (Signed)
MD at bedside. 

## 2016-01-24 NOTE — ED Triage Notes (Signed)
Pt. Stated, I started having a headache last night after a seizure , I take Dilantin and Depokote. I need the levels checked. My headache was so bad I had blurred vision .

## 2016-01-24 NOTE — ED Notes (Signed)
Pt is resting comfortably in bed with eyes closed. Per MD Madilyn Hookees pt will be held in ED until 1130 when next Dilantin dose can be given.

## 2016-01-24 NOTE — Discharge Instructions (Signed)
Take your dilantin and depakote as prescribed.  You cannot drive a vehicle until cleared by your Neurologist.

## 2016-01-24 NOTE — ED Notes (Signed)
Patient was given a sprite in a cup of ice. 

## 2016-02-17 ENCOUNTER — Emergency Department (HOSPITAL_COMMUNITY)
Admission: EM | Admit: 2016-02-17 | Discharge: 2016-02-17 | Disposition: A | Discharge status: Home / Self Care | Payer: Self-pay | Attending: Emergency Medicine | Admitting: Emergency Medicine

## 2016-02-17 ENCOUNTER — Encounter (HOSPITAL_COMMUNITY): Payer: Self-pay | Admitting: Emergency Medicine

## 2016-02-17 DIAGNOSIS — N730 Acute parametritis and pelvic cellulitis: Secondary | ICD-10-CM

## 2016-02-17 DIAGNOSIS — N739 Female pelvic inflammatory disease, unspecified: Secondary | ICD-10-CM | POA: Insufficient documentation

## 2016-02-17 DIAGNOSIS — Z87891 Personal history of nicotine dependence: Secondary | ICD-10-CM | POA: Insufficient documentation

## 2016-02-17 DIAGNOSIS — R569 Unspecified convulsions: Secondary | ICD-10-CM

## 2016-02-17 DIAGNOSIS — G40909 Epilepsy, unspecified, not intractable, without status epilepticus: Secondary | ICD-10-CM | POA: Insufficient documentation

## 2016-02-17 DIAGNOSIS — N938 Other specified abnormal uterine and vaginal bleeding: Secondary | ICD-10-CM | POA: Insufficient documentation

## 2016-02-17 LAB — CBC WITH DIFFERENTIAL/PLATELET
BASOS PCT: 1 %
Basophils Absolute: 0.1 10*3/uL (ref 0.0–0.1)
EOS ABS: 0.4 10*3/uL (ref 0.0–0.7)
EOS PCT: 5 %
HCT: 37 % (ref 36.0–46.0)
HEMOGLOBIN: 13.2 g/dL (ref 12.0–15.0)
Lymphocytes Relative: 41 %
Lymphs Abs: 3.2 10*3/uL (ref 0.7–4.0)
MCH: 31.7 pg (ref 26.0–34.0)
MCHC: 35.7 g/dL (ref 30.0–36.0)
MCV: 88.9 fL (ref 78.0–100.0)
Monocytes Absolute: 0.6 10*3/uL (ref 0.1–1.0)
Monocytes Relative: 7 %
NEUTROS PCT: 46 %
Neutro Abs: 3.6 10*3/uL (ref 1.7–7.7)
PLATELETS: 336 10*3/uL (ref 150–400)
RBC: 4.16 MIL/uL (ref 3.87–5.11)
RDW: 12.4 % (ref 11.5–15.5)
WBC: 7.8 10*3/uL (ref 4.0–10.5)

## 2016-02-17 LAB — COMPREHENSIVE METABOLIC PANEL
ALBUMIN: 3.7 g/dL (ref 3.5–5.0)
ALK PHOS: 89 U/L (ref 38–126)
ALT: 16 U/L (ref 14–54)
ANION GAP: 6 (ref 5–15)
AST: 14 U/L — ABNORMAL LOW (ref 15–41)
BUN: 11 mg/dL (ref 6–20)
CALCIUM: 8.8 mg/dL — AB (ref 8.9–10.3)
CHLORIDE: 111 mmol/L (ref 101–111)
CO2: 26 mmol/L (ref 22–32)
Creatinine, Ser: 0.6 mg/dL (ref 0.44–1.00)
GFR calc non Af Amer: >60 mL/min (ref >60)
GLUCOSE: 101 mg/dL — AB (ref 65–99)
Potassium: 3.6 mmol/L (ref 3.5–5.1)
SODIUM: 143 mmol/L (ref 135–145)
Total Bilirubin: 0.3 mg/dL (ref 0.3–1.2)
Total Protein: 7.5 g/dL (ref 6.5–8.1)

## 2016-02-17 LAB — WET PREP, GENITAL
Clue Cells Wet Prep HPF POC: NONE SEEN
Sperm: NONE SEEN
TRICH WET PREP: NONE SEEN
YEAST WET PREP: NONE SEEN

## 2016-02-17 LAB — VALPROIC ACID LEVEL

## 2016-02-17 LAB — PHENYTOIN LEVEL, TOTAL: Phenytoin Lvl: <2.5 ug/mL — ABNORMAL LOW (ref 10.0–20.0)

## 2016-02-17 LAB — I-STAT BETA HCG BLOOD, ED (MC, WL, AP ONLY)

## 2016-02-17 MED ORDER — LIDOCAINE HCL 1 % IJ SOLN
INTRAMUSCULAR | Status: AC
  Filled 2016-02-17: qty 20

## 2016-02-17 MED ORDER — VALPROATE SODIUM 500 MG/5ML IV SOLN
1000.0000 mg | INTRAVENOUS | Status: AC
  Administered 2016-02-17: 1000 mg via INTRAVENOUS
  Filled 2016-02-17 (×2): qty 10

## 2016-02-17 MED ORDER — PHENYTOIN SODIUM EXTENDED 100 MG PO CAPS
400.0000 mg | ORAL_CAPSULE | Freq: Once | ORAL | Status: AC
  Administered 2016-02-17: 400 mg via ORAL
  Filled 2016-02-17: qty 4

## 2016-02-17 MED ORDER — PHENYTOIN SODIUM EXTENDED 100 MG PO CAPS
400.0000 mg | ORAL_CAPSULE | ORAL | Status: AC
  Administered 2016-02-17: 400 mg via ORAL
  Filled 2016-02-17: qty 4

## 2016-02-17 MED ORDER — CEFTRIAXONE SODIUM 250 MG IJ SOLR
250.0000 mg | Freq: Once | INTRAMUSCULAR | Status: AC
  Administered 2016-02-17: 250 mg via INTRAMUSCULAR
  Filled 2016-02-17: qty 250

## 2016-02-17 MED ORDER — DOXYCYCLINE HYCLATE 100 MG PO CAPS: 100.0000 mg | ORAL_CAPSULE | Freq: Two times a day (BID) | ORAL | 0 refills | Status: DC

## 2016-02-17 MED ORDER — AZITHROMYCIN 250 MG PO TABS
1000.0000 mg | ORAL_TABLET | Freq: Once | ORAL | Status: AC
  Administered 2016-02-17: 1000 mg via ORAL
  Filled 2016-02-17: qty 4

## 2016-02-17 MED ORDER — HYDROCODONE-ACETAMINOPHEN 5-325 MG PO TABS
2.0000 | ORAL_TABLET | Freq: Once | ORAL | Status: AC
  Administered 2016-02-17: 2 via ORAL
  Filled 2016-02-17: qty 2

## 2016-02-17 NOTE — ED Notes (Signed)
Pt states she is still unable to give a urine sample. Rn AgnewHolly made aware.

## 2016-02-17 NOTE — ED Triage Notes (Signed)
Pt reports seizure , witnessed by boyfriend. sts did not take her meds x 3 days. Hx seizure/ pt sts rode  the bus to come to ED . Alert and oriented x 4.

## 2016-02-17 NOTE — ED Notes (Signed)
MD at bedside. 

## 2016-02-17 NOTE — Discharge Instructions (Signed)
TAKE THE DILANTIN WE PROVIDED IN  2 HOURS. TAKE THE ANTIBIOTICS WE PROVIDED.

## 2016-02-17 NOTE — Progress Notes (Signed)
Patient noted to have been seen in the ED 7 times within the last 6 months.  Patient listed as not having insurance.  [facility]  EDCM spoke to patient at bedside.  [facility]  Patient reports, "I'm there everyday."  EDCM asked patient if she has an oppurtunity to follow up with resources provided to her by day shift EDCM during previous visit?  Patient responded that she was "in jail" and didn't have a chance to do so.  [facility] for [facility] with the orange card and [facility]  Patient verbalized understanding.  No further EDCM needs at this time.

## 2016-02-17 NOTE — ED Notes (Signed)
Pt made aware of need of urine sample  
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
## 2016-02-17 NOTE — ED Notes (Signed)
Pt states she is unable to urinate at this time.

## 2016-02-18 LAB — GC/CHLAMYDIA PROBE AMP (~~LOC~~) NOT AT ARMC
CHLAMYDIA, DNA PROBE: NEGATIVE
Neisseria Gonorrhea: NEGATIVE

## 2016-02-22 ENCOUNTER — Emergency Department (HOSPITAL_COMMUNITY)
Admission: EM | Admit: 2016-02-22 | Discharge: 2016-02-22 | Disposition: A | Discharge status: Home / Self Care | Payer: Self-pay | Attending: Emergency Medicine | Admitting: Emergency Medicine

## 2016-02-22 ENCOUNTER — Encounter (HOSPITAL_COMMUNITY): Payer: Self-pay

## 2016-02-22 ENCOUNTER — Emergency Department (HOSPITAL_COMMUNITY): Payer: Self-pay

## 2016-02-22 DIAGNOSIS — Z87891 Personal history of nicotine dependence: Secondary | ICD-10-CM | POA: Insufficient documentation

## 2016-02-22 DIAGNOSIS — Z791 Long term (current) use of non-steroidal anti-inflammatories (NSAID): Secondary | ICD-10-CM | POA: Insufficient documentation

## 2016-02-22 DIAGNOSIS — R1013 Epigastric pain: Secondary | ICD-10-CM | POA: Insufficient documentation

## 2016-02-22 DIAGNOSIS — Z79899 Other long term (current) drug therapy: Secondary | ICD-10-CM | POA: Insufficient documentation

## 2016-02-22 LAB — COMPREHENSIVE METABOLIC PANEL
ALBUMIN: 4 g/dL (ref 3.5–5.0)
ALT: 20 U/L (ref 14–54)
ANION GAP: 7 (ref 5–15)
AST: 24 U/L (ref 15–41)
Alkaline Phosphatase: 78 U/L (ref 38–126)
BUN: 16 mg/dL (ref 6–20)
CALCIUM: 9.1 mg/dL (ref 8.9–10.3)
CO2: 26 mmol/L (ref 22–32)
Chloride: 107 mmol/L (ref 101–111)
Creatinine, Ser: 0.77 mg/dL (ref 0.44–1.00)
GFR calc Af Amer: >60 mL/min (ref >60)
GLUCOSE: 104 mg/dL — AB (ref 65–99)
POTASSIUM: 3.6 mmol/L (ref 3.5–5.1)
SODIUM: 140 mmol/L (ref 135–145)
TOTAL PROTEIN: 7.9 g/dL (ref 6.5–8.1)
Total Bilirubin: 0.2 mg/dL — ABNORMAL LOW (ref 0.3–1.2)

## 2016-02-22 LAB — CBC
HEMATOCRIT: 39.6 % (ref 36.0–46.0)
Hemoglobin: 13.3 g/dL (ref 12.0–15.0)
MCH: 31 pg (ref 26.0–34.0)
MCHC: 33.6 g/dL (ref 30.0–36.0)
MCV: 92.3 fL (ref 78.0–100.0)
PLATELETS: 413 10*3/uL — AB (ref 150–400)
RBC: 4.29 MIL/uL (ref 3.87–5.11)
RDW: 13.1 % (ref 11.5–15.5)
WBC: 8.7 10*3/uL (ref 4.0–10.5)

## 2016-02-22 LAB — URINALYSIS, ROUTINE W REFLEX MICROSCOPIC
BILIRUBIN URINE: NEGATIVE
Glucose, UA: NEGATIVE mg/dL
KETONES UR: NEGATIVE mg/dL
NITRITE: NEGATIVE
PH: 6 (ref 5.0–8.0)
PROTEIN: 30 mg/dL — AB
Specific Gravity, Urine: 1.035 — ABNORMAL HIGH (ref 1.005–1.030)

## 2016-02-22 LAB — LIPASE, BLOOD: LIPASE: 27 U/L (ref 11–51)

## 2016-02-22 LAB — URINE MICROSCOPIC-ADD ON

## 2016-02-22 LAB — POC URINE PREG, ED: PREG TEST UR: NEGATIVE

## 2016-02-22 MED ORDER — SODIUM CHLORIDE 0.9 % IV BOLUS (SEPSIS)
1000.0000 mL | Freq: Once | INTRAVENOUS | Status: AC
  Administered 2016-02-22: 1000 mL via INTRAVENOUS

## 2016-02-22 MED ORDER — KETOROLAC TROMETHAMINE 30 MG/ML IJ SOLN
30.0000 mg | Freq: Once | INTRAMUSCULAR | Status: AC
  Administered 2016-02-22: 30 mg via INTRAVENOUS
  Filled 2016-02-22: qty 1

## 2016-02-22 NOTE — Discharge Instructions (Signed)
Try to drink more water and juice, and increase the fiber in your diet to improve stooling.  Take Colace twice a day, for 3 weeks to improve the abdominal discomfort.  Call your pharmacy, to get refills on your prescriptions as soon as possible.

## 2016-02-22 NOTE — ED Triage Notes (Signed)
Pt BIB [facility]. She states that she has had upper abdominal pain and bloating x1 day. States that she was seen by her PCP this morning. They gave her one shot of tramadol and advised her to come here if her pain did not decrease. Denies pregnancy. Recently dx'd with PID A&Ox4. Ambulatory and tearful in triage.

## 2016-02-22 NOTE — ED Notes (Signed)
Pt crying in triage. States that the pain has triggered her seizures and that she had 2 "big" seizures this morning.
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
## 2016-03-06 ENCOUNTER — Emergency Department (HOSPITAL_COMMUNITY): Payer: Self-pay

## 2016-03-06 ENCOUNTER — Emergency Department (HOSPITAL_COMMUNITY)
Admission: EM | Admit: 2016-03-06 | Discharge: 2016-03-06 | Disposition: A | Discharge status: Home / Self Care | Payer: Self-pay | Attending: Emergency Medicine | Admitting: Emergency Medicine

## 2016-03-06 ENCOUNTER — Encounter (HOSPITAL_COMMUNITY): Payer: Self-pay | Admitting: *Deleted

## 2016-03-06 DIAGNOSIS — Z7951 Long term (current) use of inhaled steroids: Secondary | ICD-10-CM | POA: Insufficient documentation

## 2016-03-06 DIAGNOSIS — Y929 Unspecified place or not applicable: Secondary | ICD-10-CM | POA: Insufficient documentation

## 2016-03-06 DIAGNOSIS — W19XXXA Unspecified fall, initial encounter: Secondary | ICD-10-CM

## 2016-03-06 DIAGNOSIS — Y9389 Activity, other specified: Secondary | ICD-10-CM | POA: Insufficient documentation

## 2016-03-06 DIAGNOSIS — S0990XA Unspecified injury of head, initial encounter: Secondary | ICD-10-CM | POA: Insufficient documentation

## 2016-03-06 DIAGNOSIS — Z79899 Other long term (current) drug therapy: Secondary | ICD-10-CM | POA: Insufficient documentation

## 2016-03-06 DIAGNOSIS — Z87891 Personal history of nicotine dependence: Secondary | ICD-10-CM | POA: Insufficient documentation

## 2016-03-06 DIAGNOSIS — Y999 Unspecified external cause status: Secondary | ICD-10-CM | POA: Insufficient documentation

## 2016-03-06 LAB — CBC WITH DIFFERENTIAL/PLATELET
BASOS ABS: 0 10*3/uL (ref 0.0–0.1)
BASOS PCT: 1 %
EOS PCT: 4 %
Eosinophils Absolute: 0.3 10*3/uL (ref 0.0–0.7)
HCT: 38.8 % (ref 36.0–46.0)
Hemoglobin: 13.1 g/dL (ref 12.0–15.0)
Lymphocytes Relative: 25 %
Lymphs Abs: 1.8 10*3/uL (ref 0.7–4.0)
MCH: 31 pg (ref 26.0–34.0)
MCHC: 33.8 g/dL (ref 30.0–36.0)
MCV: 91.7 fL (ref 78.0–100.0)
MONO ABS: 0.6 10*3/uL (ref 0.1–1.0)
MONOS PCT: 9 %
Neutro Abs: 4.4 10*3/uL (ref 1.7–7.7)
Neutrophils Relative %: 61 %
PLATELETS: 356 10*3/uL (ref 150–400)
RBC: 4.23 MIL/uL (ref 3.87–5.11)
RDW: 13 % (ref 11.5–15.5)
WBC: 7.1 10*3/uL (ref 4.0–10.5)

## 2016-03-06 LAB — COMPREHENSIVE METABOLIC PANEL
ALBUMIN: 4 g/dL (ref 3.5–5.0)
ALT: 14 U/L (ref 14–54)
ANION GAP: 7 (ref 5–15)
AST: 21 U/L (ref 15–41)
Alkaline Phosphatase: 65 U/L (ref 38–126)
BUN: 12 mg/dL (ref 6–20)
CHLORIDE: 107 mmol/L (ref 101–111)
CO2: 23 mmol/L (ref 22–32)
Calcium: 8.7 mg/dL — ABNORMAL LOW (ref 8.9–10.3)
Creatinine, Ser: 0.67 mg/dL (ref 0.44–1.00)
GFR calc Af Amer: >60 mL/min (ref >60)
GLUCOSE: 80 mg/dL (ref 65–99)
POTASSIUM: 3.4 mmol/L — AB (ref 3.5–5.1)
Sodium: 137 mmol/L (ref 135–145)
Total Bilirubin: 0.4 mg/dL (ref 0.3–1.2)
Total Protein: 7.7 g/dL (ref 6.5–8.1)

## 2016-03-06 LAB — PHENYTOIN LEVEL, TOTAL: Phenytoin Lvl: <2.5 ug/mL — ABNORMAL LOW (ref 10.0–20.0)

## 2016-03-06 LAB — POC URINE PREG, ED: PREG TEST UR: NEGATIVE

## 2016-03-06 LAB — VALPROIC ACID LEVEL: Valproic Acid Lvl: <10 ug/mL — ABNORMAL LOW (ref 50.0–100.0)

## 2016-03-06 MED ORDER — SODIUM CHLORIDE 0.9 % IV SOLN
1000.0000 mL | Freq: Once | INTRAVENOUS | Status: AC
  Administered 2016-03-06: 1000 mL via INTRAVENOUS

## 2016-03-06 MED ORDER — DIPHENHYDRAMINE HCL 50 MG/ML IJ SOLN
25.0000 mg | Freq: Once | INTRAMUSCULAR | Status: AC
Start: 2016-03-06 — End: 2016-03-06
  Administered 2016-03-06: 25 mg via INTRAVENOUS
  Filled 2016-03-06: qty 1

## 2016-03-06 MED ORDER — DIVALPROEX SODIUM 500 MG PO DR TAB: 1000.0000 mg | DELAYED_RELEASE_TABLET | Freq: Every day | ORAL | 0 refills | Status: DC

## 2016-03-06 MED ORDER — METOCLOPRAMIDE HCL 5 MG/ML IJ SOLN
10.0000 mg | Freq: Once | INTRAMUSCULAR | Status: AC
  Administered 2016-03-06: 10 mg via INTRAVENOUS
  Filled 2016-03-06: qty 2

## 2016-03-06 MED ORDER — PHENYTOIN SODIUM EXTENDED 100 MG PO CAPS: 100.0000 mg | ORAL_CAPSULE | Freq: Three times a day (TID) | ORAL | 0 refills | Status: DC

## 2016-03-06 MED ORDER — VALPROATE SODIUM 500 MG/5ML IV SOLN
1000.0000 mg | Freq: Once | INTRAVENOUS | Status: AC
  Administered 2016-03-06: 1000 mg via INTRAVENOUS
  Filled 2016-03-06: qty 10

## 2016-03-06 MED ORDER — SODIUM CHLORIDE 0.9 % IV SOLN
2000.0000 mg | Freq: Once | INTRAVENOUS | Status: AC
  Administered 2016-03-06: 2000 mg via INTRAVENOUS
  Filled 2016-03-06: qty 40

## 2016-03-06 MED ORDER — NAPROXEN 500 MG PO TABS: 500.0000 mg | ORAL_TABLET | Freq: Two times a day (BID) | ORAL | 0 refills | Status: DC | PRN
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
## 2016-03-07 ENCOUNTER — Emergency Department (HOSPITAL_COMMUNITY)
Admission: EM | Admit: 2016-03-07 | Discharge: 2016-03-08 | Disposition: A | Discharge status: Home / Self Care | Payer: Self-pay | Attending: Emergency Medicine | Admitting: Emergency Medicine

## 2016-03-07 ENCOUNTER — Encounter (HOSPITAL_COMMUNITY): Payer: Self-pay | Admitting: Emergency Medicine

## 2016-03-07 DIAGNOSIS — Z79899 Other long term (current) drug therapy: Secondary | ICD-10-CM | POA: Insufficient documentation

## 2016-03-07 DIAGNOSIS — G40909 Epilepsy, unspecified, not intractable, without status epilepticus: Secondary | ICD-10-CM | POA: Insufficient documentation

## 2016-03-07 DIAGNOSIS — R569 Unspecified convulsions: Secondary | ICD-10-CM

## 2016-03-07 DIAGNOSIS — Z87891 Personal history of nicotine dependence: Secondary | ICD-10-CM | POA: Insufficient documentation

## 2016-03-07 LAB — CBC WITH DIFFERENTIAL/PLATELET
BASOS PCT: 1 %
Basophils Absolute: 0.1 10*3/uL (ref 0.0–0.1)
EOS ABS: 0.4 10*3/uL (ref 0.0–0.7)
EOS PCT: 4 %
HCT: 36.8 % (ref 36.0–46.0)
HEMOGLOBIN: 12.6 g/dL (ref 12.0–15.0)
LYMPHS ABS: 4 10*3/uL (ref 0.7–4.0)
Lymphocytes Relative: 40 %
MCH: 31.3 pg (ref 26.0–34.0)
MCHC: 34.2 g/dL (ref 30.0–36.0)
MCV: 91.5 fL (ref 78.0–100.0)
Monocytes Absolute: 0.9 10*3/uL (ref 0.1–1.0)
Monocytes Relative: 9 %
NEUTROS PCT: 46 %
Neutro Abs: 4.6 10*3/uL (ref 1.7–7.7)
PLATELETS: 356 10*3/uL (ref 150–400)
RBC: 4.02 MIL/uL (ref 3.87–5.11)
RDW: 13.1 % (ref 11.5–15.5)
WBC: 9.9 10*3/uL (ref 4.0–10.5)

## 2016-03-07 LAB — COMPREHENSIVE METABOLIC PANEL
ALK PHOS: 62 U/L (ref 38–126)
ALT: 14 U/L (ref 14–54)
AST: 16 U/L (ref 15–41)
Albumin: 3.7 g/dL (ref 3.5–5.0)
Anion gap: 7 (ref 5–15)
BILIRUBIN TOTAL: 0.4 mg/dL (ref 0.3–1.2)
BUN: 8 mg/dL (ref 6–20)
CALCIUM: 8.8 mg/dL — AB (ref 8.9–10.3)
CO2: 21 mmol/L — ABNORMAL LOW (ref 22–32)
CREATININE: 0.53 mg/dL (ref 0.44–1.00)
Chloride: 110 mmol/L (ref 101–111)
Glucose, Bld: 94 mg/dL (ref 65–99)
Potassium: 3.6 mmol/L (ref 3.5–5.1)
Sodium: 138 mmol/L (ref 135–145)
TOTAL PROTEIN: 7.3 g/dL (ref 6.5–8.1)

## 2016-03-07 LAB — VALPROIC ACID LEVEL

## 2016-03-07 LAB — PHENYTOIN LEVEL, TOTAL: PHENYTOIN LVL: 8.4 ug/mL — AB (ref 10.0–20.0)

## 2016-03-07 MED ORDER — DIVALPROEX SODIUM 500 MG PO DR TAB
1000.0000 mg | DELAYED_RELEASE_TABLET | Freq: Once | ORAL | Status: AC
  Administered 2016-03-07: 1000 mg via ORAL
  Filled 2016-03-07 (×2): qty 2

## 2016-03-07 MED ORDER — VALPROATE SODIUM 500 MG/5ML IV SOLN
1000.0000 mg | Freq: Once | INTRAVENOUS | Status: AC
  Administered 2016-03-07: 1000 mg via INTRAVENOUS
  Filled 2016-03-07: qty 10

## 2016-03-07 MED ORDER — SODIUM CHLORIDE 0.9 % IV BOLUS (SEPSIS)
1000.0000 mL | Freq: Once | INTRAVENOUS | Status: AC
  Administered 2016-03-07: 1000 mL via INTRAVENOUS

## 2016-03-07 MED ORDER — PHENYTOIN SODIUM EXTENDED 100 MG PO CAPS
200.0000 mg | ORAL_CAPSULE | Freq: Once | ORAL | Status: AC
  Administered 2016-03-07: 200 mg via ORAL
  Filled 2016-03-07 (×2): qty 2

## 2016-03-07 MED ORDER — PHENYTOIN SODIUM EXTENDED 100 MG PO CAPS
100.0000 mg | ORAL_CAPSULE | Freq: Once | ORAL | Status: AC
  Administered 2016-03-07: 100 mg via ORAL
  Filled 2016-03-07 (×2): qty 1
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
## 2016-03-07 NOTE — ED Notes (Signed)
Bed: [id number] Expected date:  Expected time:  Means of arrival:  Comments: 30yo M seizure
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
## 2016-03-07 NOTE — ED Notes (Signed)
Report to Rick, RN.

## 2016-03-17 LAB — GLUCOSE, POCT (MANUAL RESULT ENTRY): POC GLUCOSE: 76 mg/dL (ref 70–99)

## 2016-04-06 ENCOUNTER — Emergency Department (HOSPITAL_COMMUNITY): Payer: Self-pay

## 2016-04-06 ENCOUNTER — Emergency Department (HOSPITAL_COMMUNITY)
Admission: EM | Admit: 2016-04-06 | Discharge: 2016-04-06 | Disposition: A | Discharge status: Home / Self Care | Payer: Self-pay | Attending: Emergency Medicine | Admitting: Emergency Medicine

## 2016-04-06 ENCOUNTER — Encounter (HOSPITAL_COMMUNITY): Payer: Self-pay | Admitting: Emergency Medicine

## 2016-04-06 DIAGNOSIS — Y9389 Activity, other specified: Secondary | ICD-10-CM | POA: Insufficient documentation

## 2016-04-06 DIAGNOSIS — Y999 Unspecified external cause status: Secondary | ICD-10-CM | POA: Insufficient documentation

## 2016-04-06 DIAGNOSIS — Y929 Unspecified place or not applicable: Secondary | ICD-10-CM | POA: Insufficient documentation

## 2016-04-06 DIAGNOSIS — F1290 Cannabis use, unspecified, uncomplicated: Secondary | ICD-10-CM | POA: Insufficient documentation

## 2016-04-06 DIAGNOSIS — Z79899 Other long term (current) drug therapy: Secondary | ICD-10-CM | POA: Insufficient documentation

## 2016-04-06 DIAGNOSIS — S60222A Contusion of left hand, initial encounter: Secondary | ICD-10-CM | POA: Insufficient documentation

## 2016-04-06 DIAGNOSIS — M79642 Pain in left hand: Secondary | ICD-10-CM

## 2016-04-06 DIAGNOSIS — Z87891 Personal history of nicotine dependence: Secondary | ICD-10-CM | POA: Insufficient documentation

## 2016-04-06 DIAGNOSIS — W1839XA Other fall on same level, initial encounter: Secondary | ICD-10-CM | POA: Insufficient documentation

## 2016-04-06 MED ORDER — METHOCARBAMOL 500 MG PO TABS: 500.0000 mg | ORAL_TABLET | Freq: Two times a day (BID) | ORAL | 0 refills | Status: DC

## 2016-04-06 MED ORDER — MELOXICAM 7.5 MG PO TABS: 7.5000 mg | ORAL_TABLET | Freq: Every day | ORAL | 0 refills | Status: DC

## 2016-04-06 MED ORDER — HYDROCODONE-ACETAMINOPHEN 5-325 MG PO TABS
1.0000 | ORAL_TABLET | Freq: Once | ORAL | Status: AC
  Administered 2016-04-06: 1 via ORAL
  Filled 2016-04-06: qty 1

## 2016-04-06 MED ORDER — HYDROCODONE-ACETAMINOPHEN 5-325 MG PO TABS: 1.0000 | ORAL_TABLET | Freq: Four times a day (QID) | ORAL | 0 refills | Status: DC | PRN

## 2016-04-06 NOTE — ED Notes (Signed)
Bed: WTR5 Expected date:  Expected time:  Means of arrival:  Comments: 
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
## 2016-04-06 NOTE — ED Notes (Signed)
Ortho tech notified of splint need. 

## 2016-04-06 NOTE — ED Notes (Signed)
Patient transported to X-ray 
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
## 2016-04-06 NOTE — Discharge Instructions (Signed)
Read the information below.  Use the prescribed medication as directed.  Please discuss all new medications with your pharmacist.  Do not take additional tylenol while taking the prescribed pain medication to avoid overdose.  You may return to the Emergency Department at any time for worsening condition or any new symptoms that concern you.    If there is any possibility that you might be pregnant, please let your health care provider know and discuss this with the pharmacist to ensure medication safety.    If you develop uncontrolled pain, weakness or numbness of the extremity, severe discoloration of the skin, or you are unable to use your hand, return to the ER for a recheck.

## 2016-04-06 NOTE — ED Notes (Signed)
Discharge instructions, follow up, and rx x2 reviewed with patient. Patient verbalized understanding.

## 2016-06-12 ENCOUNTER — Encounter (HOSPITAL_COMMUNITY): Payer: Self-pay | Admitting: Emergency Medicine

## 2016-06-12 ENCOUNTER — Emergency Department (HOSPITAL_COMMUNITY)
Admission: EM | Admit: 2016-06-12 | Discharge: 2016-06-12 | Disposition: A | Discharge status: Home / Self Care | Payer: Self-pay | Attending: Emergency Medicine | Admitting: Emergency Medicine

## 2016-06-12 DIAGNOSIS — B9789 Other viral agents as the cause of diseases classified elsewhere: Secondary | ICD-10-CM

## 2016-06-12 DIAGNOSIS — Z87891 Personal history of nicotine dependence: Secondary | ICD-10-CM | POA: Insufficient documentation

## 2016-06-12 DIAGNOSIS — J069 Acute upper respiratory infection, unspecified: Secondary | ICD-10-CM | POA: Insufficient documentation

## 2016-06-12 DIAGNOSIS — Z79899 Other long term (current) drug therapy: Secondary | ICD-10-CM | POA: Insufficient documentation

## 2016-06-12 LAB — RAPID STREP SCREEN (MED CTR MEBANE ONLY): STREPTOCOCCUS, GROUP A SCREEN (DIRECT): NEGATIVE

## 2016-06-12 MED ORDER — BENZONATATE 100 MG PO CAPS: 100.0000 mg | ORAL_CAPSULE | Freq: Three times a day (TID) | ORAL | 0 refills | Status: DC

## 2016-06-12 NOTE — Discharge Instructions (Signed)
Please drink plenty of fluids Use Tylenol or Motrin for pain/fever Take Tessalon for cough Follow up with family doctor Return to the ED for worsening symptoms

## 2016-06-12 NOTE — ED Triage Notes (Signed)
Pt with cough, left ear pain and sore throat. Taking ibuprofen and dayquil.
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
## 2016-06-14 LAB — CULTURE, GROUP A STREP (THRC)

## 2016-08-11 ENCOUNTER — Emergency Department (HOSPITAL_COMMUNITY)
Admission: EM | Admit: 2016-08-11 | Discharge: 2016-08-11 | Disposition: A | Discharge status: Home / Self Care | Payer: Self-pay | Attending: Emergency Medicine | Admitting: Emergency Medicine

## 2016-08-11 DIAGNOSIS — G40909 Epilepsy, unspecified, not intractable, without status epilepticus: Secondary | ICD-10-CM | POA: Insufficient documentation

## 2016-08-11 DIAGNOSIS — R569 Unspecified convulsions: Secondary | ICD-10-CM

## 2016-08-11 DIAGNOSIS — Z87891 Personal history of nicotine dependence: Secondary | ICD-10-CM | POA: Insufficient documentation

## 2016-08-11 LAB — CBC WITH DIFFERENTIAL/PLATELET
BASOS ABS: 0 10*3/uL (ref 0.0–0.1)
Basophils Relative: 0 %
Eosinophils Absolute: 0.3 10*3/uL (ref 0.0–0.7)
Eosinophils Relative: 3 %
HEMATOCRIT: 42.2 % (ref 36.0–46.0)
Hemoglobin: 14.4 g/dL (ref 12.0–15.0)
LYMPHS ABS: 2.7 10*3/uL (ref 0.7–4.0)
LYMPHS PCT: 31 %
MCH: 30.5 pg (ref 26.0–34.0)
MCHC: 34.1 g/dL (ref 30.0–36.0)
MCV: 89.4 fL (ref 78.0–100.0)
MONO ABS: 0.7 10*3/uL (ref 0.1–1.0)
Monocytes Relative: 8 %
NEUTROS ABS: 5 10*3/uL (ref 1.7–7.7)
Neutrophils Relative %: 58 %
Platelets: 362 10*3/uL (ref 150–400)
RBC: 4.72 MIL/uL (ref 3.87–5.11)
RDW: 13 % (ref 11.5–15.5)
WBC: 8.7 10*3/uL (ref 4.0–10.5)

## 2016-08-11 LAB — BASIC METABOLIC PANEL
ANION GAP: 7 (ref 5–15)
BUN: 8 mg/dL (ref 6–20)
CHLORIDE: 106 mmol/L (ref 101–111)
CO2: 24 mmol/L (ref 22–32)
Calcium: 9.1 mg/dL (ref 8.9–10.3)
Creatinine, Ser: 0.49 mg/dL (ref 0.44–1.00)
GFR calc Af Amer: >60 mL/min (ref >60)
GFR calc non Af Amer: >60 mL/min (ref >60)
GLUCOSE: 75 mg/dL (ref 65–99)
POTASSIUM: 3.8 mmol/L (ref 3.5–5.1)
Sodium: 137 mmol/L (ref 135–145)

## 2016-08-11 LAB — PHENYTOIN LEVEL, TOTAL: Phenytoin Lvl: <2.5 ug/mL — ABNORMAL LOW (ref 10.0–20.0)

## 2016-08-11 LAB — VALPROIC ACID LEVEL: Valproic Acid Lvl: <10 ug/mL — ABNORMAL LOW (ref 50.0–100.0)

## 2016-08-11 MED ORDER — PHENYTOIN SODIUM EXTENDED 100 MG PO CAPS: 100.0000 mg | ORAL_CAPSULE | Freq: Three times a day (TID) | ORAL | 0 refills | Status: DC

## 2016-08-11 MED ORDER — PHENYTOIN SODIUM 50 MG/ML IJ SOLN
1000.0000 mg | Freq: Once | INTRAMUSCULAR | Status: AC
  Administered 2016-08-11: 1000 mg via INTRAVENOUS
  Filled 2016-08-11: qty 20

## 2016-08-11 MED ORDER — VALPROATE SODIUM 500 MG/5ML IV SOLN
1000.0000 mg | Freq: Once | INTRAVENOUS | Status: AC
  Administered 2016-08-11: 1000 mg via INTRAVENOUS
  Filled 2016-08-11: qty 10

## 2016-08-11 MED ORDER — DIVALPROEX SODIUM 500 MG PO DR TAB: 1000.0000 mg | DELAYED_RELEASE_TABLET | Freq: Every day | ORAL | 0 refills | Status: DC

## 2016-08-11 NOTE — Care Management (Signed)
[facility]. Spoke with patient who is currently homeless to [facility] and its guidelines with patient she is agreeable with  the terms and guideline. MATCH [facility]. Patient co-pay was waived due to inability to pay.
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
## 2016-08-11 NOTE — ED Notes (Signed)
I attempted to collect labs and was unsuccessful. 

## 2016-08-11 NOTE — Discharge Instructions (Signed)
Make sure to take your seizure medication every day.

## 2016-08-11 NOTE — ED Triage Notes (Signed)
Per [facility] pt had 5-6 minute full body seizure earlier today, pt c/o being tired. [facility] and has not been able to get seizure medication. Pt has had seizure everyday for past 10 days. No oral trauma or incontinence.

## 2016-08-12 ENCOUNTER — Emergency Department (HOSPITAL_COMMUNITY)
Admission: EM | Admit: 2016-08-12 | Discharge: 2016-08-12 | Disposition: A | Discharge status: Home / Self Care | Payer: Self-pay | Attending: Emergency Medicine | Admitting: Emergency Medicine

## 2016-08-12 DIAGNOSIS — Z87891 Personal history of nicotine dependence: Secondary | ICD-10-CM | POA: Insufficient documentation

## 2016-08-12 DIAGNOSIS — R569 Unspecified convulsions: Secondary | ICD-10-CM

## 2016-08-12 DIAGNOSIS — Z79899 Other long term (current) drug therapy: Secondary | ICD-10-CM | POA: Insufficient documentation

## 2016-08-12 DIAGNOSIS — G40909 Epilepsy, unspecified, not intractable, without status epilepticus: Secondary | ICD-10-CM | POA: Insufficient documentation

## 2016-08-12 MED ORDER — LORAZEPAM 1 MG PO TABS: 1.0000 mg | ORAL_TABLET | Freq: Three times a day (TID) | ORAL | 0 refills | Status: AC

## 2016-08-12 MED ORDER — LORAZEPAM 2 MG/ML IJ SOLN
1.0000 mg | Freq: Once | INTRAMUSCULAR | Status: AC
  Administered 2016-08-12: 1 mg via INTRAVENOUS
  Filled 2016-08-12: qty 1
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
## 2016-08-12 NOTE — ED Notes (Signed)
Bed: WA11 Expected date:  Expected time:  Means of arrival:  Comments: EMS-seizure 

## 2016-08-12 NOTE — Discharge Instructions (Signed)
Continue to restart on your regular medications. Take the Ativan 3 times a day for tomorrow on the next day. Schedule follow-up with your regular doctor. Return for any new or worse symptoms.

## 2016-09-21 ENCOUNTER — Emergency Department (HOSPITAL_COMMUNITY)
Admission: EM | Admit: 2016-09-21 | Discharge: 2016-09-21 | Disposition: A | Discharge status: Home / Self Care | Payer: Self-pay | Attending: Emergency Medicine | Admitting: Emergency Medicine

## 2016-09-21 ENCOUNTER — Emergency Department (HOSPITAL_BASED_OUTPATIENT_CLINIC_OR_DEPARTMENT_OTHER)
Admit: 2016-09-21 | Discharge: 2016-09-21 | Disposition: A | Discharge status: Home / Self Care | Payer: Self-pay | Attending: Student | Admitting: Student

## 2016-09-21 ENCOUNTER — Encounter (HOSPITAL_COMMUNITY): Payer: Self-pay | Admitting: Emergency Medicine

## 2016-09-21 ENCOUNTER — Emergency Department (HOSPITAL_COMMUNITY): Payer: Self-pay

## 2016-09-21 DIAGNOSIS — S51012A Laceration without foreign body of left elbow, initial encounter: Secondary | ICD-10-CM | POA: Insufficient documentation

## 2016-09-21 DIAGNOSIS — Y999 Unspecified external cause status: Secondary | ICD-10-CM | POA: Insufficient documentation

## 2016-09-21 DIAGNOSIS — Z87891 Personal history of nicotine dependence: Secondary | ICD-10-CM | POA: Insufficient documentation

## 2016-09-21 DIAGNOSIS — M25522 Pain in left elbow: Secondary | ICD-10-CM

## 2016-09-21 DIAGNOSIS — M79662 Pain in left lower leg: Secondary | ICD-10-CM | POA: Insufficient documentation

## 2016-09-21 DIAGNOSIS — W19XXXA Unspecified fall, initial encounter: Secondary | ICD-10-CM

## 2016-09-21 DIAGNOSIS — W1830XA Fall on same level, unspecified, initial encounter: Secondary | ICD-10-CM | POA: Insufficient documentation

## 2016-09-21 DIAGNOSIS — Y929 Unspecified place or not applicable: Secondary | ICD-10-CM | POA: Insufficient documentation

## 2016-09-21 DIAGNOSIS — Z79899 Other long term (current) drug therapy: Secondary | ICD-10-CM | POA: Insufficient documentation

## 2016-09-21 DIAGNOSIS — M79609 Pain in unspecified limb: Secondary | ICD-10-CM

## 2016-09-21 DIAGNOSIS — Y939 Activity, unspecified: Secondary | ICD-10-CM | POA: Insufficient documentation

## 2016-09-21 MED ORDER — BACITRACIN ZINC 500 UNIT/GM EX OINT
TOPICAL_OINTMENT | Freq: Once | CUTANEOUS | Status: AC
  Administered 2016-09-21: 1 via TOPICAL
  Filled 2016-09-21: qty 1.8

## 2016-09-21 MED ORDER — HYDROCODONE-ACETAMINOPHEN 5-325 MG PO TABS
1.0000 | ORAL_TABLET | Freq: Once | ORAL | Status: AC
  Administered 2016-09-21: 1 via ORAL
  Filled 2016-09-21: qty 1
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
## 2016-09-21 NOTE — Progress Notes (Signed)
**  Preliminary report by tech**  Left lower extremity venous duplex complete. There is no obvious evidence of deep or superficial vein thrombosis involving the left lower extremity. All clearly visualized vessels appear patent and compressible. There is no evidence of a Baker's cyst on the left. Results were given to Dr. Eudelia Bunch.  09/21/16 3:39 PM Olen Cordial RVT

## 2016-09-21 NOTE — Discharge Instructions (Signed)
X-ray showed no fracture of her left elbow. Please keep the wound clean and dry with antibody ointment. Watch out for signs of infection including redness or purulent drainage. Your ultrasound of your lower leg showed no blood clot. Please rest, ice, elevate her left leg. Motrin and Tylenol for pain. May use her muscle relaxer. Warm soaks in Epsom salt. Follow-up with your primary care doctor if symptoms do not improve. Return to ED if symptoms worsen.

## 2016-09-21 NOTE — ED Triage Notes (Addendum)
Per [facility] pt had seizure yesterday and fell onto left side. Pt c/o left leg pain and laceration to left elbow. Pt from [facility]. Reports taking Dilantin and depakote due to insurance just  started 4 days ago back on meds. Pt reports not having seizure since seen here in Feb.

## 2016-11-05 ENCOUNTER — Emergency Department (HOSPITAL_COMMUNITY): Payer: Self-pay

## 2016-11-05 ENCOUNTER — Emergency Department (HOSPITAL_COMMUNITY)
Admission: EM | Admit: 2016-11-05 | Discharge: 2016-11-05 | Disposition: A | Discharge status: Home / Self Care | Payer: Self-pay | Attending: Emergency Medicine | Admitting: Emergency Medicine

## 2016-11-05 ENCOUNTER — Encounter (HOSPITAL_COMMUNITY): Payer: Self-pay | Admitting: *Deleted

## 2016-11-05 DIAGNOSIS — S0083XA Contusion of other part of head, initial encounter: Secondary | ICD-10-CM | POA: Insufficient documentation

## 2016-11-05 DIAGNOSIS — G40909 Epilepsy, unspecified, not intractable, without status epilepticus: Secondary | ICD-10-CM | POA: Insufficient documentation

## 2016-11-05 DIAGNOSIS — Z87891 Personal history of nicotine dependence: Secondary | ICD-10-CM | POA: Insufficient documentation

## 2016-11-05 DIAGNOSIS — R569 Unspecified convulsions: Secondary | ICD-10-CM

## 2016-11-05 DIAGNOSIS — Y929 Unspecified place or not applicable: Secondary | ICD-10-CM | POA: Insufficient documentation

## 2016-11-05 DIAGNOSIS — Y9389 Activity, other specified: Secondary | ICD-10-CM | POA: Insufficient documentation

## 2016-11-05 DIAGNOSIS — Y999 Unspecified external cause status: Secondary | ICD-10-CM | POA: Insufficient documentation

## 2016-11-05 LAB — BASIC METABOLIC PANEL
Anion gap: 7 (ref 5–15)
BUN: 7 mg/dL (ref 6–20)
CO2: 24 mmol/L (ref 22–32)
Calcium: 8.8 mg/dL — ABNORMAL LOW (ref 8.9–10.3)
Chloride: 104 mmol/L (ref 101–111)
Creatinine, Ser: 0.8 mg/dL (ref 0.44–1.00)
GFR calc non Af Amer: >60 mL/min (ref >60)
GLUCOSE: 96 mg/dL (ref 65–99)
Potassium: 3.4 mmol/L — ABNORMAL LOW (ref 3.5–5.1)
SODIUM: 135 mmol/L (ref 135–145)

## 2016-11-05 LAB — CBC
HCT: 40.1 % (ref 36.0–46.0)
HEMOGLOBIN: 13.9 g/dL (ref 12.0–15.0)
MCH: 31.4 pg (ref 26.0–34.0)
MCHC: 34.7 g/dL (ref 30.0–36.0)
MCV: 90.5 fL (ref 78.0–100.0)
PLATELETS: 383 10*3/uL (ref 150–400)
RBC: 4.43 MIL/uL (ref 3.87–5.11)
RDW: 12.7 % (ref 11.5–15.5)
WBC: 10.6 10*3/uL — ABNORMAL HIGH (ref 4.0–10.5)

## 2016-11-05 MED ORDER — OXYCODONE-ACETAMINOPHEN 5-325 MG PO TABS
1.0000 | ORAL_TABLET | Freq: Once | ORAL | Status: AC
  Administered 2016-11-05: 1 via ORAL
  Filled 2016-11-05: qty 1

## 2016-11-05 MED ORDER — HYDROCODONE-ACETAMINOPHEN 5-325 MG PO TABS: 1.0000 | ORAL_TABLET | ORAL | 0 refills | Status: DC | PRN

## 2016-11-05 MED ORDER — SODIUM CHLORIDE 0.9 % IV SOLN
1000.0000 mg | Freq: Once | INTRAVENOUS | Status: AC
  Administered 2016-11-05: 1000 mg via INTRAVENOUS
  Filled 2016-11-05: qty 20

## 2016-11-05 MED ORDER — DIVALPROEX SODIUM 250 MG PO DR TAB
1000.0000 mg | DELAYED_RELEASE_TABLET | Freq: Once | ORAL | Status: AC
  Administered 2016-11-05: 1000 mg via ORAL
  Filled 2016-11-05: qty 4

## 2016-11-05 MED ORDER — IBUPROFEN 800 MG PO TABS
800.0000 mg | ORAL_TABLET | Freq: Once | ORAL | Status: AC
  Administered 2016-11-05: 800 mg via ORAL
  Filled 2016-11-05: qty 1

## 2016-11-05 NOTE — ED Notes (Signed)
Pt currently in tears stating her face is hurting. EDP made aware.
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
## 2016-12-08 ENCOUNTER — Emergency Department (HOSPITAL_COMMUNITY)
Admission: EM | Admit: 2016-12-08 | Discharge: 2016-12-08 | Disposition: A | Discharge status: Home / Self Care | Payer: Self-pay | Attending: Emergency Medicine | Admitting: Emergency Medicine

## 2016-12-08 ENCOUNTER — Emergency Department (HOSPITAL_COMMUNITY): Payer: Self-pay

## 2016-12-08 ENCOUNTER — Encounter (HOSPITAL_COMMUNITY): Payer: Self-pay

## 2016-12-08 DIAGNOSIS — Y92009 Unspecified place in unspecified non-institutional (private) residence as the place of occurrence of the external cause: Secondary | ICD-10-CM | POA: Insufficient documentation

## 2016-12-08 DIAGNOSIS — M25512 Pain in left shoulder: Secondary | ICD-10-CM | POA: Insufficient documentation

## 2016-12-08 DIAGNOSIS — W19XXXA Unspecified fall, initial encounter: Secondary | ICD-10-CM

## 2016-12-08 DIAGNOSIS — Y999 Unspecified external cause status: Secondary | ICD-10-CM | POA: Insufficient documentation

## 2016-12-08 DIAGNOSIS — Y9389 Activity, other specified: Secondary | ICD-10-CM | POA: Insufficient documentation

## 2016-12-08 DIAGNOSIS — Z87891 Personal history of nicotine dependence: Secondary | ICD-10-CM | POA: Insufficient documentation

## 2016-12-08 DIAGNOSIS — W11XXXA Fall on and from ladder, initial encounter: Secondary | ICD-10-CM | POA: Insufficient documentation

## 2016-12-08 DIAGNOSIS — G40909 Epilepsy, unspecified, not intractable, without status epilepticus: Secondary | ICD-10-CM | POA: Insufficient documentation

## 2016-12-08 LAB — CBC
HEMATOCRIT: 40.1 % (ref 36.0–46.0)
HEMOGLOBIN: 13.5 g/dL (ref 12.0–15.0)
MCH: 30.8 pg (ref 26.0–34.0)
MCHC: 33.7 g/dL (ref 30.0–36.0)
MCV: 91.3 fL (ref 78.0–100.0)
Platelets: 320 10*3/uL (ref 150–400)
RBC: 4.39 MIL/uL (ref 3.87–5.11)
RDW: 12.7 % (ref 11.5–15.5)
WBC: 7.9 10*3/uL (ref 4.0–10.5)

## 2016-12-08 LAB — I-STAT BETA HCG BLOOD, ED (MC, WL, AP ONLY): I-stat hCG, quantitative: <5 m[IU]/mL (ref <5)

## 2016-12-08 LAB — BASIC METABOLIC PANEL
ANION GAP: 9 (ref 5–15)
BUN: 8 mg/dL (ref 6–20)
CHLORIDE: 108 mmol/L (ref 101–111)
CO2: 20 mmol/L — ABNORMAL LOW (ref 22–32)
Calcium: 8.7 mg/dL — ABNORMAL LOW (ref 8.9–10.3)
Creatinine, Ser: 0.59 mg/dL (ref 0.44–1.00)
GFR calc Af Amer: >60 mL/min (ref >60)
GLUCOSE: 89 mg/dL (ref 65–99)
POTASSIUM: 4.6 mmol/L (ref 3.5–5.1)
Sodium: 137 mmol/L (ref 135–145)

## 2016-12-08 LAB — CBG MONITORING, ED: Glucose-Capillary: 88 mg/dL (ref 65–99)

## 2016-12-08 MED ORDER — PHENYTOIN SODIUM EXTENDED 100 MG PO CAPS: 100.0000 mg | ORAL_CAPSULE | Freq: Three times a day (TID) | ORAL | 0 refills | Status: DC

## 2016-12-08 MED ORDER — VALPROATE SODIUM 500 MG/5ML IV SOLN
1000.0000 mg | Freq: Once | INTRAVENOUS | Status: AC
  Administered 2016-12-08: 1000 mg via INTRAVENOUS
  Filled 2016-12-08: qty 10

## 2016-12-08 MED ORDER — VALPROATE SODIUM 500 MG/5ML IV SOLN: 500.0000 mg | Freq: Once | INTRAVENOUS | Status: DC

## 2016-12-08 MED ORDER — OXYCODONE HCL 5 MG PO TABS
5.0000 mg | ORAL_TABLET | Freq: Once | ORAL | Status: AC
  Administered 2016-12-08: 5 mg via ORAL
  Filled 2016-12-08: qty 1

## 2016-12-08 MED ORDER — VALPROATE SODIUM 500 MG/5ML IV SOLN
500.0000 mg | Freq: Two times a day (BID) | INTRAVENOUS | Status: DC
  Filled 2016-12-08: qty 5

## 2016-12-08 MED ORDER — SODIUM CHLORIDE 0.9 % IV SOLN
1500.0000 mg | Freq: Once | INTRAVENOUS | Status: AC
  Administered 2016-12-08: 1500 mg via INTRAVENOUS
  Filled 2016-12-08: qty 30

## 2016-12-08 MED ORDER — PHENYTOIN SODIUM 50 MG/ML IJ SOLN
100.0000 mg | Freq: Three times a day (TID) | INTRAMUSCULAR | Status: DC
  Filled 2016-12-08: qty 2

## 2016-12-08 MED ORDER — ACETAMINOPHEN 500 MG PO TABS
1000.0000 mg | ORAL_TABLET | Freq: Once | ORAL | Status: AC
  Administered 2016-12-08: 1000 mg via ORAL
  Filled 2016-12-08: qty 2

## 2016-12-08 MED ORDER — DIVALPROEX SODIUM 500 MG PO DR TAB: 1000.0000 mg | DELAYED_RELEASE_TABLET | Freq: Every day | ORAL | 0 refills | Status: DC

## 2016-12-08 MED ORDER — SODIUM CHLORIDE 0.9 % IV BOLUS (SEPSIS)
1000.0000 mL | Freq: Once | INTRAVENOUS | Status: AC
Start: 2016-12-08 — End: 2016-12-08
  Administered 2016-12-08: 1000 mL via INTRAVENOUS

## 2016-12-08 MED ORDER — CYCLOBENZAPRINE HCL 10 MG PO TABS: 10.0000 mg | ORAL_TABLET | Freq: Two times a day (BID) | ORAL | 0 refills | Status: DC | PRN

## 2016-12-08 NOTE — ED Notes (Signed)
Seizures pads placed.

## 2016-12-08 NOTE — Progress Notes (Signed)
MEDICATION RELATED CONSULT NOTE - INITIAL   Pharmacy Consult for phenytoin Indication: seizures  Allergies  Allergen Reactions  . Sulfa Antibiotics Other (See Comments)    Reaction:  Unknown     Patient Measurements: Height: 5\' 2"  (157.5 cm) Weight: 250 lb (113.4 kg) IBW/kg (Calculated) : 50.1  Vital Signs: Temp: 98.7 F (37.1 C) (06/26 1057) Temp Source: Oral (06/26 1057) BP: 117/77 (06/26 1300) Pulse Rate: 72 (06/26 1300)   Assessment: 31 yo female admitted with fall caused by seizures, she has had multiple seizures over the past 24 hours. She is on AEDs prior to admission but ran out of medications and has not had any phenytoin nor valproic acid in about 1 week. Will load the patient with IV and continue her PTA maintenance doses at the IV equivalent until she is able to PO. Did not feel that levels were necessary at this time given the patient has not taken any medicine in significant timeframe.  Plan:  Phenytoin 1500 mg IV x1 then 100 mg IV tid Valproic acid 1 g IV x1 then 500 mg IV bid When pt is able to take PO, change back to PTA regimen DPH level as indicated   Nakyla Bracco, Darl HouseholderAlison M 12/08/2016,1:50 PM

## 2016-12-08 NOTE — ED Notes (Signed)
ED Provider at bedside. 

## 2016-12-08 NOTE — ED Notes (Signed)
Pt felt nauseated and had 1 occurrence of emesis

## 2016-12-08 NOTE — ED Triage Notes (Signed)
Pt presents to the ed with ems for seizures. She has epilepsy and has been out of her medications for a week, depakote and dilantin. She has had 6 seizures in the last 24 hours the last two being grand mal. Patient fell down four steps with the last seizure and hit her head. Complaints of head and shoulder pain. Alert and oriented. Pt seems mildly post ictal.
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
## 2016-12-08 NOTE — ED Notes (Signed)
Gave pt bag lunch per pt request.  MD aware

## 2017-01-12 ENCOUNTER — Emergency Department (HOSPITAL_COMMUNITY)
Admission: EM | Admit: 2017-01-12 | Discharge: 2017-01-12 | Disposition: A | Discharge status: Home / Self Care | Payer: Self-pay | Attending: Emergency Medicine | Admitting: Emergency Medicine

## 2017-01-12 DIAGNOSIS — N3090 Cystitis, unspecified without hematuria: Secondary | ICD-10-CM | POA: Insufficient documentation

## 2017-01-12 DIAGNOSIS — R569 Unspecified convulsions: Secondary | ICD-10-CM | POA: Insufficient documentation

## 2017-01-12 DIAGNOSIS — N3000 Acute cystitis without hematuria: Secondary | ICD-10-CM

## 2017-01-12 DIAGNOSIS — Z79891 Long term (current) use of opiate analgesic: Secondary | ICD-10-CM | POA: Insufficient documentation

## 2017-01-12 DIAGNOSIS — Z87891 Personal history of nicotine dependence: Secondary | ICD-10-CM | POA: Insufficient documentation

## 2017-01-12 DIAGNOSIS — Z79899 Other long term (current) drug therapy: Secondary | ICD-10-CM | POA: Insufficient documentation

## 2017-01-12 LAB — URINALYSIS, ROUTINE W REFLEX MICROSCOPIC
BILIRUBIN URINE: NEGATIVE
Bacteria, UA: NONE SEEN
GLUCOSE, UA: NEGATIVE mg/dL
HGB URINE DIPSTICK: NEGATIVE
Ketones, ur: 20 mg/dL — AB
NITRITE: NEGATIVE
PROTEIN: 30 mg/dL — AB
RBC / HPF: NONE SEEN RBC/hpf (ref 0–5)
Specific Gravity, Urine: 1.03 (ref 1.005–1.030)
pH: 5 (ref 5.0–8.0)

## 2017-01-12 LAB — CBC WITH DIFFERENTIAL/PLATELET
BASOS PCT: 1 %
Basophils Absolute: 0 10*3/uL (ref 0.0–0.1)
EOS ABS: 0.3 10*3/uL (ref 0.0–0.7)
Eosinophils Relative: 4 %
HCT: 38.1 % (ref 36.0–46.0)
HEMOGLOBIN: 13.2 g/dL (ref 12.0–15.0)
Lymphocytes Relative: 29 %
Lymphs Abs: 2.2 10*3/uL (ref 0.7–4.0)
MCH: 30.8 pg (ref 26.0–34.0)
MCHC: 34.6 g/dL (ref 30.0–36.0)
MCV: 89 fL (ref 78.0–100.0)
Monocytes Absolute: 0.6 10*3/uL (ref 0.1–1.0)
Monocytes Relative: 8 %
Neutro Abs: 4.4 10*3/uL (ref 1.7–7.7)
Neutrophils Relative %: 58 %
PLATELETS: 358 10*3/uL (ref 150–400)
RBC: 4.28 MIL/uL (ref 3.87–5.11)
RDW: 12.3 % (ref 11.5–15.5)
WBC: 7.6 10*3/uL (ref 4.0–10.5)

## 2017-01-12 LAB — COMPREHENSIVE METABOLIC PANEL
ALBUMIN: 3.3 g/dL — AB (ref 3.5–5.0)
ALK PHOS: 71 U/L (ref 38–126)
ALT: 43 U/L (ref 14–54)
ANION GAP: 8 (ref 5–15)
AST: 37 U/L (ref 15–41)
BUN: 8 mg/dL (ref 6–20)
CHLORIDE: 107 mmol/L (ref 101–111)
CO2: 23 mmol/L (ref 22–32)
Calcium: 8.1 mg/dL — ABNORMAL LOW (ref 8.9–10.3)
Creatinine, Ser: 0.57 mg/dL (ref 0.44–1.00)
GFR calc Af Amer: >60 mL/min (ref >60)
GFR calc non Af Amer: >60 mL/min (ref >60)
GLUCOSE: 98 mg/dL (ref 65–99)
POTASSIUM: 3.7 mmol/L (ref 3.5–5.1)
SODIUM: 138 mmol/L (ref 135–145)
Total Bilirubin: 0.3 mg/dL (ref 0.3–1.2)
Total Protein: 7 g/dL (ref 6.5–8.1)

## 2017-01-12 LAB — I-STAT BETA HCG BLOOD, ED (MC, WL, AP ONLY)

## 2017-01-12 LAB — PHENYTOIN LEVEL, TOTAL

## 2017-01-12 LAB — LIPASE, BLOOD: Lipase: 20 U/L (ref 11–51)

## 2017-01-12 LAB — VALPROIC ACID LEVEL: Valproic Acid Lvl: 77 ug/mL (ref 50.0–100.0)

## 2017-01-12 MED ORDER — CEFTRIAXONE SODIUM 1 G IJ SOLR
1.0000 g | Freq: Once | INTRAMUSCULAR | Status: AC
  Administered 2017-01-12: 1 g via INTRAVENOUS
  Filled 2017-01-12: qty 10

## 2017-01-12 MED ORDER — LORAZEPAM 2 MG/ML IJ SOLN
0.5000 mg | Freq: Once | INTRAMUSCULAR | Status: AC
  Administered 2017-01-12: 0.5 mg via INTRAVENOUS
  Filled 2017-01-12: qty 1

## 2017-01-12 MED ORDER — SODIUM CHLORIDE 0.9 % IV SOLN
1000.0000 mg | Freq: Once | INTRAVENOUS | Status: AC
  Administered 2017-01-12: 1000 mg via INTRAVENOUS
  Filled 2017-01-12: qty 20

## 2017-01-12 MED ORDER — DICYCLOMINE HCL 10 MG PO CAPS
10.0000 mg | ORAL_CAPSULE | Freq: Once | ORAL | Status: AC
  Administered 2017-01-12: 10 mg via ORAL
  Filled 2017-01-12: qty 1

## 2017-01-12 MED ORDER — GI COCKTAIL ~~LOC~~
30.0000 mL | Freq: Once | ORAL | Status: AC
  Administered 2017-01-12: 30 mL via ORAL
  Filled 2017-01-12: qty 30

## 2017-01-12 MED ORDER — ONDANSETRON 4 MG PO TBDP: 4.0000 mg | ORAL_TABLET | Freq: Three times a day (TID) | ORAL | 0 refills | Status: DC | PRN

## 2017-01-12 MED ORDER — CEPHALEXIN 500 MG PO CAPS: 500.0000 mg | ORAL_CAPSULE | Freq: Two times a day (BID) | ORAL | 0 refills | Status: DC

## 2017-01-12 MED ORDER — ONDANSETRON HCL 4 MG/2ML IJ SOLN
4.0000 mg | Freq: Once | INTRAMUSCULAR | Status: AC
  Administered 2017-01-12: 4 mg via INTRAVENOUS
  Filled 2017-01-12: qty 2

## 2017-01-12 MED ORDER — SODIUM CHLORIDE 0.9 % IV BOLUS (SEPSIS)
1000.0000 mL | Freq: Once | INTRAVENOUS | Status: AC
Start: 2017-01-12 — End: 2017-01-12
  Administered 2017-01-12: 1000 mL via INTRAVENOUS

## 2017-01-12 NOTE — ED Notes (Signed)
PA at bedside.

## 2017-01-12 NOTE — Discharge Instructions (Signed)
Take Keflex as prescribed until all gone. Take Zofran as prescribed as needed for nausea and vomiting. Please make sure to take all of your seizure medications. Follow-up with family doctor as needed. Return if worsening.

## 2017-01-12 NOTE — ED Triage Notes (Signed)
Per EMS, pt from home, reports she started vomiting early this am, causing her to have 3 seizure episodes.  Pt is A&o x 4.  Pt walked to open the door for EMS.
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
## 2017-01-12 NOTE — ED Notes (Signed)
Bed: WA03 Expected date:  Expected time:  Means of arrival:  Comments: 

## 2017-01-12 NOTE — ED Notes (Addendum)
Patient c/o bilateral blurred vision after receiving Dilantin infusion. Reports "this normally happens." PA made aware.

## 2017-01-12 NOTE — ED Notes (Signed)
Patient observed falling while attempting to walk out of room. Assisted patient into wheelchair and back into bed. Patient denies any complaints. Denies head injury. No head injury observed. PA made aware.

## 2017-01-12 NOTE — ED Notes (Signed)
ED Provider at bedside. 

## 2017-01-12 NOTE — ED Triage Notes (Signed)
Pt reports that she had 4 seizures last night. Pt states she has a hx of seizures and is on seizure medication but had gone without it for a month. Pt states she just recently restarted her medication. Pt states she kept waking up in odd places and believes she was having seizures. Pt reports episodes of emesis, and semi-watery stool. Pt also reports abdominal pain in the left and right lower quadrants.  Pt states she was home alone last night so no one witnessed her seizures.

## 2017-01-13 LAB — URINE CULTURE

## 2017-01-21 ENCOUNTER — Encounter (HOSPITAL_COMMUNITY): Payer: Self-pay | Admitting: Nurse Practitioner

## 2017-01-21 ENCOUNTER — Emergency Department (HOSPITAL_COMMUNITY)
Admission: EM | Admit: 2017-01-21 | Discharge: 2017-01-22 | Disposition: A | Discharge status: Home / Self Care | Payer: Self-pay | Attending: Emergency Medicine | Admitting: Emergency Medicine

## 2017-01-21 ENCOUNTER — Emergency Department (HOSPITAL_COMMUNITY): Payer: Self-pay

## 2017-01-21 DIAGNOSIS — G40909 Epilepsy, unspecified, not intractable, without status epilepticus: Secondary | ICD-10-CM | POA: Insufficient documentation

## 2017-01-21 DIAGNOSIS — Z87891 Personal history of nicotine dependence: Secondary | ICD-10-CM | POA: Insufficient documentation

## 2017-01-21 DIAGNOSIS — Z79899 Other long term (current) drug therapy: Secondary | ICD-10-CM | POA: Insufficient documentation

## 2017-01-21 DIAGNOSIS — R569 Unspecified convulsions: Secondary | ICD-10-CM

## 2017-01-21 LAB — BASIC METABOLIC PANEL
ANION GAP: 8 (ref 5–15)
BUN: 9 mg/dL (ref 6–20)
CHLORIDE: 105 mmol/L (ref 101–111)
CO2: 24 mmol/L (ref 22–32)
Calcium: 8.9 mg/dL (ref 8.9–10.3)
Creatinine, Ser: 0.66 mg/dL (ref 0.44–1.00)
Glucose, Bld: 87 mg/dL (ref 65–99)
POTASSIUM: 3.9 mmol/L (ref 3.5–5.1)
SODIUM: 137 mmol/L (ref 135–145)

## 2017-01-21 LAB — URINALYSIS, ROUTINE W REFLEX MICROSCOPIC
BILIRUBIN URINE: NEGATIVE
Glucose, UA: NEGATIVE mg/dL
Hgb urine dipstick: NEGATIVE
KETONES UR: NEGATIVE mg/dL
Nitrite: NEGATIVE
PH: 5 (ref 5.0–8.0)
Protein, ur: NEGATIVE mg/dL
Specific Gravity, Urine: 1.024 (ref 1.005–1.030)

## 2017-01-21 LAB — CBC WITH DIFFERENTIAL/PLATELET
BASOS ABS: 0 10*3/uL (ref 0.0–0.1)
Basophils Relative: 0 %
EOS ABS: 0.5 10*3/uL (ref 0.0–0.7)
EOS PCT: 5 %
HCT: 43.3 % (ref 36.0–46.0)
HEMOGLOBIN: 15.4 g/dL — AB (ref 12.0–15.0)
LYMPHS ABS: 3 10*3/uL (ref 0.7–4.0)
LYMPHS PCT: 30 %
MCH: 32 pg (ref 26.0–34.0)
MCHC: 35.6 g/dL (ref 30.0–36.0)
MCV: 90 fL (ref 78.0–100.0)
Monocytes Absolute: 1 10*3/uL (ref 0.1–1.0)
Monocytes Relative: 10 %
NEUTROS PCT: 55 %
Neutro Abs: 5.5 10*3/uL (ref 1.7–7.7)
Platelets: 315 10*3/uL (ref 150–400)
RBC: 4.81 MIL/uL (ref 3.87–5.11)
RDW: 12.8 % (ref 11.5–15.5)
WBC: 9.9 10*3/uL (ref 4.0–10.5)

## 2017-01-21 LAB — CBG MONITORING, ED: GLUCOSE-CAPILLARY: 85 mg/dL (ref 65–99)

## 2017-01-21 LAB — VALPROIC ACID LEVEL

## 2017-01-21 LAB — PREGNANCY, URINE: PREG TEST UR: NEGATIVE

## 2017-01-21 LAB — PHENYTOIN LEVEL, TOTAL: Phenytoin Lvl: <2.5 ug/mL — ABNORMAL LOW (ref 10.0–20.0)

## 2017-01-21 MED ORDER — SODIUM CHLORIDE 0.9 % IV BOLUS (SEPSIS)
1000.0000 mL | Freq: Once | INTRAVENOUS | Status: AC
Start: 2017-01-21 — End: 2017-01-22
  Administered 2017-01-21: 1000 mL via INTRAVENOUS

## 2017-01-21 MED ORDER — PHENYTOIN SODIUM 50 MG/ML IJ SOLN: 100.0000 mg | Freq: Once | INTRAMUSCULAR | Status: DC

## 2017-01-21 MED ORDER — SODIUM CHLORIDE 0.9 % IV SOLN
1250.0000 mg | Freq: Once | INTRAVENOUS | Status: AC
  Administered 2017-01-21: 1250 mg via INTRAVENOUS
  Filled 2017-01-21: qty 25

## 2017-01-21 MED ORDER — CEPHALEXIN 500 MG PO CAPS: 500.0000 mg | ORAL_CAPSULE | Freq: Two times a day (BID) | ORAL | 0 refills | Status: AC

## 2017-01-21 MED ORDER — ACETAMINOPHEN 325 MG PO TABS
650.0000 mg | ORAL_TABLET | Freq: Once | ORAL | Status: AC
  Administered 2017-01-21: 650 mg via ORAL
  Filled 2017-01-21: qty 2

## 2017-01-21 MED ORDER — DIVALPROEX SODIUM 500 MG PO DR TAB: 1000.0000 mg | DELAYED_RELEASE_TABLET | Freq: Every day | ORAL | 0 refills | Status: DC

## 2017-01-21 MED ORDER — VALPROATE SODIUM 500 MG/5ML IV SOLN
500.0000 mg | Freq: Once | INTRAVENOUS | Status: AC
  Administered 2017-01-22: 500 mg via INTRAVENOUS
  Filled 2017-01-21: qty 5

## 2017-01-21 MED ORDER — VALPROATE SODIUM 500 MG/5ML IV SOLN
500.0000 mg | Freq: Once | INTRAVENOUS | Status: DC
  Filled 2017-01-21: qty 5

## 2017-01-21 MED ORDER — PHENYTOIN SODIUM EXTENDED 100 MG PO CAPS: 100.0000 mg | ORAL_CAPSULE | Freq: Three times a day (TID) | ORAL | 0 refills | Status: DC
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
## 2017-01-21 NOTE — ED Notes (Signed)
IV team at bedside 

## 2017-01-21 NOTE — ED Notes (Signed)
Attempted IV access x 2. Unsuccessful. 
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
## 2017-01-22 NOTE — ED Notes (Signed)
Patient given sprite.

## 2017-05-27 ENCOUNTER — Encounter (HOSPITAL_COMMUNITY): Payer: Self-pay | Admitting: Emergency Medicine

## 2017-05-27 DIAGNOSIS — Z87891 Personal history of nicotine dependence: Secondary | ICD-10-CM | POA: Insufficient documentation

## 2017-05-27 DIAGNOSIS — Y999 Unspecified external cause status: Secondary | ICD-10-CM | POA: Insufficient documentation

## 2017-05-27 DIAGNOSIS — S0083XA Contusion of other part of head, initial encounter: Secondary | ICD-10-CM | POA: Insufficient documentation

## 2017-05-27 DIAGNOSIS — R040 Epistaxis: Secondary | ICD-10-CM | POA: Insufficient documentation

## 2017-05-27 DIAGNOSIS — Z79899 Other long term (current) drug therapy: Secondary | ICD-10-CM | POA: Insufficient documentation

## 2017-05-27 DIAGNOSIS — Y929 Unspecified place or not applicable: Secondary | ICD-10-CM | POA: Insufficient documentation

## 2017-05-27 DIAGNOSIS — Y939 Activity, unspecified: Secondary | ICD-10-CM | POA: Insufficient documentation

## 2017-05-27 MED ORDER — OXYCODONE-ACETAMINOPHEN 5-325 MG PO TABS
1.0000 | ORAL_TABLET | Freq: Once | ORAL | Status: AC
  Administered 2017-05-27: 1 via ORAL
  Filled 2017-05-27: qty 1

## 2017-05-27 MED ORDER — ONDANSETRON 4 MG PO TBDP
4.0000 mg | ORAL_TABLET | Freq: Once | ORAL | Status: AC
  Administered 2017-05-27: 4 mg via ORAL
  Filled 2017-05-27: qty 1
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
## 2017-05-27 NOTE — ED Notes (Signed)
Patient given the choice on whether to wait in subwaiting or lobby.  Pt requested lobby to charge her phone

## 2017-05-28 ENCOUNTER — Emergency Department (HOSPITAL_COMMUNITY): Payer: Self-pay

## 2017-05-28 ENCOUNTER — Emergency Department (HOSPITAL_COMMUNITY)
Admission: EM | Admit: 2017-05-28 | Discharge: 2017-05-28 | Disposition: A | Discharge status: Home / Self Care | Payer: Self-pay | Attending: Emergency Medicine | Admitting: Emergency Medicine

## 2017-05-28 DIAGNOSIS — S0083XA Contusion of other part of head, initial encounter: Secondary | ICD-10-CM

## 2017-05-28 DIAGNOSIS — R040 Epistaxis: Secondary | ICD-10-CM

## 2017-05-28 MED ORDER — HYDROCODONE-ACETAMINOPHEN 5-325 MG PO TABS
2.0000 | ORAL_TABLET | Freq: Once | ORAL | Status: AC
  Administered 2017-05-28: 2 via ORAL
  Filled 2017-05-28: qty 2

## 2017-05-28 MED ORDER — IBUPROFEN 800 MG PO TABS
800.0000 mg | ORAL_TABLET | Freq: Once | ORAL | Status: AC
  Administered 2017-05-28: 800 mg via ORAL
  Filled 2017-05-28: qty 1

## 2017-05-28 NOTE — ED Notes (Signed)
Patient transported to CT 
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
## 2017-05-28 NOTE — ED Notes (Signed)
ED Provider at bedside. 

## 2017-07-28 ENCOUNTER — Other Ambulatory Visit: Payer: Self-pay | Admitting: *Deleted

## 2017-07-28 DIAGNOSIS — Z1231 Encounter for screening mammogram for malignant neoplasm of breast: Secondary | ICD-10-CM

## 2017-07-31 ENCOUNTER — Emergency Department (HOSPITAL_COMMUNITY)
Admission: EM | Admit: 2017-07-31 | Discharge: 2017-07-31 | Disposition: A | Discharge status: Home / Self Care | Payer: Self-pay | Attending: Emergency Medicine | Admitting: Emergency Medicine

## 2017-07-31 ENCOUNTER — Other Ambulatory Visit: Payer: Self-pay

## 2017-07-31 ENCOUNTER — Encounter (HOSPITAL_COMMUNITY): Payer: Self-pay | Admitting: *Deleted

## 2017-07-31 DIAGNOSIS — N390 Urinary tract infection, site not specified: Secondary | ICD-10-CM | POA: Insufficient documentation

## 2017-07-31 DIAGNOSIS — G40909 Epilepsy, unspecified, not intractable, without status epilepticus: Secondary | ICD-10-CM | POA: Insufficient documentation

## 2017-07-31 DIAGNOSIS — Z79899 Other long term (current) drug therapy: Secondary | ICD-10-CM | POA: Insufficient documentation

## 2017-07-31 DIAGNOSIS — Z87891 Personal history of nicotine dependence: Secondary | ICD-10-CM | POA: Insufficient documentation

## 2017-07-31 HISTORY — DX: Obesity, unspecified: E66.9

## 2017-07-31 LAB — COMPREHENSIVE METABOLIC PANEL
ALBUMIN: 3.4 g/dL — AB (ref 3.5–5.0)
ALK PHOS: 72 U/L (ref 38–126)
ALT: 79 U/L — AB (ref 14–54)
AST: 48 U/L — ABNORMAL HIGH (ref 15–41)
Anion gap: 8 (ref 5–15)
BUN: <5 mg/dL — ABNORMAL LOW (ref 6–20)
CALCIUM: 8.5 mg/dL — AB (ref 8.9–10.3)
CO2: 22 mmol/L (ref 22–32)
CREATININE: 0.63 mg/dL (ref 0.44–1.00)
Chloride: 108 mmol/L (ref 101–111)
GFR calc non Af Amer: >60 mL/min (ref >60)
GLUCOSE: 80 mg/dL (ref 65–99)
Potassium: 3.6 mmol/L (ref 3.5–5.1)
Sodium: 138 mmol/L (ref 135–145)
Total Bilirubin: 0.7 mg/dL (ref 0.3–1.2)
Total Protein: 6.7 g/dL (ref 6.5–8.1)

## 2017-07-31 LAB — CBC
HCT: 39.6 % (ref 36.0–46.0)
Hemoglobin: 13.3 g/dL (ref 12.0–15.0)
MCH: 31 pg (ref 26.0–34.0)
MCHC: 33.6 g/dL (ref 30.0–36.0)
MCV: 92.3 fL (ref 78.0–100.0)
PLATELETS: 382 10*3/uL (ref 150–400)
RBC: 4.29 MIL/uL (ref 3.87–5.11)
RDW: 12.6 % (ref 11.5–15.5)
WBC: 7.9 10*3/uL (ref 4.0–10.5)

## 2017-07-31 LAB — URINALYSIS, ROUTINE W REFLEX MICROSCOPIC
Bilirubin Urine: NEGATIVE
GLUCOSE, UA: NEGATIVE mg/dL
Ketones, ur: NEGATIVE mg/dL
Nitrite: NEGATIVE
PH: 7 (ref 5.0–8.0)
PROTEIN: NEGATIVE mg/dL
Specific Gravity, Urine: 1.025 (ref 1.005–1.030)

## 2017-07-31 LAB — PHENYTOIN LEVEL, TOTAL

## 2017-07-31 LAB — I-STAT BETA HCG BLOOD, ED (MC, WL, AP ONLY): I-stat hCG, quantitative: <5 m[IU]/mL (ref <5)

## 2017-07-31 LAB — LIPASE, BLOOD: Lipase: 21 U/L (ref 11–51)

## 2017-07-31 LAB — VALPROIC ACID LEVEL

## 2017-07-31 MED ORDER — CEPHALEXIN 500 MG PO CAPS: 500.0000 mg | ORAL_CAPSULE | Freq: Four times a day (QID) | ORAL | 0 refills | Status: DC

## 2017-07-31 MED ORDER — GI COCKTAIL ~~LOC~~
30.0000 mL | Freq: Once | ORAL | Status: AC
  Administered 2017-07-31: 30 mL via ORAL
  Filled 2017-07-31: qty 30

## 2017-07-31 MED ORDER — SODIUM CHLORIDE 0.9 % IV SOLN
1.0000 g | INTRAVENOUS | Status: DC
  Administered 2017-07-31: 1 g via INTRAVENOUS
  Filled 2017-07-31: qty 10

## 2017-07-31 MED ORDER — FAMOTIDINE 20 MG PO TABS
40.0000 mg | ORAL_TABLET | Freq: Once | ORAL | Status: AC
  Administered 2017-07-31: 40 mg via ORAL
  Filled 2017-07-31: qty 2

## 2017-07-31 MED ORDER — SUCRALFATE 1 G PO TABS
1.0000 g | ORAL_TABLET | Freq: Once | ORAL | Status: AC
  Administered 2017-07-31: 1 g via ORAL
  Filled 2017-07-31: qty 1

## 2017-07-31 MED ORDER — SODIUM CHLORIDE 0.9 % IV SOLN
1000.0000 mg | Freq: Once | INTRAVENOUS | Status: AC
  Administered 2017-07-31: 1000 mg via INTRAVENOUS
  Filled 2017-07-31: qty 20

## 2017-07-31 MED ORDER — VALPROATE SODIUM 500 MG/5ML IV SOLN
1000.0000 mg | Freq: Once | INTRAVENOUS | Status: AC
  Administered 2017-07-31: 1000 mg via INTRAVENOUS
  Filled 2017-07-31: qty 10

## 2017-07-31 NOTE — ED Triage Notes (Signed)
Pt reports upper abd pain since yesterday morning with diarrhea. States the abd pain is causing her to have seizures. Has hx of seizures and off her meds x 2 weeks.

## 2017-07-31 NOTE — ED Notes (Signed)
Seizure pads put on bed per Dr.
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
## 2017-07-31 NOTE — ED Notes (Signed)
Pt understood dc material. NAD noted. Script given at dc  

## 2017-09-21 ENCOUNTER — Encounter (HOSPITAL_COMMUNITY): Payer: Self-pay

## 2017-09-21 ENCOUNTER — Emergency Department (HOSPITAL_COMMUNITY)
Admission: EM | Admit: 2017-09-21 | Discharge: 2017-09-21 | Disposition: A | Discharge status: Home / Self Care | Payer: Self-pay | Attending: Emergency Medicine | Admitting: Emergency Medicine

## 2017-09-21 DIAGNOSIS — Z87891 Personal history of nicotine dependence: Secondary | ICD-10-CM | POA: Insufficient documentation

## 2017-09-21 DIAGNOSIS — R197 Diarrhea, unspecified: Secondary | ICD-10-CM | POA: Insufficient documentation

## 2017-09-21 DIAGNOSIS — R112 Nausea with vomiting, unspecified: Secondary | ICD-10-CM | POA: Insufficient documentation

## 2017-09-21 DIAGNOSIS — F1910 Other psychoactive substance abuse, uncomplicated: Secondary | ICD-10-CM | POA: Insufficient documentation

## 2017-09-21 DIAGNOSIS — R1013 Epigastric pain: Secondary | ICD-10-CM | POA: Insufficient documentation

## 2017-09-21 DIAGNOSIS — Z79899 Other long term (current) drug therapy: Secondary | ICD-10-CM | POA: Insufficient documentation

## 2017-09-21 LAB — URINALYSIS, ROUTINE W REFLEX MICROSCOPIC
BILIRUBIN URINE: NEGATIVE
GLUCOSE, UA: NEGATIVE mg/dL
HGB URINE DIPSTICK: NEGATIVE
Ketones, ur: NEGATIVE mg/dL
NITRITE: NEGATIVE
Protein, ur: NEGATIVE mg/dL
SPECIFIC GRAVITY, URINE: 1.023 (ref 1.005–1.030)
pH: 5 (ref 5.0–8.0)

## 2017-09-21 LAB — COMPREHENSIVE METABOLIC PANEL
ALT: 67 U/L — ABNORMAL HIGH (ref 14–54)
ANION GAP: 6 (ref 5–15)
AST: 39 U/L (ref 15–41)
Albumin: 3.6 g/dL (ref 3.5–5.0)
Alkaline Phosphatase: 83 U/L (ref 38–126)
BILIRUBIN TOTAL: 0.5 mg/dL (ref 0.3–1.2)
BUN: 6 mg/dL (ref 6–20)
CO2: 20 mmol/L — ABNORMAL LOW (ref 22–32)
Calcium: 8.8 mg/dL — ABNORMAL LOW (ref 8.9–10.3)
Chloride: 109 mmol/L (ref 101–111)
Creatinine, Ser: 0.61 mg/dL (ref 0.44–1.00)
GFR calc Af Amer: >60 mL/min (ref >60)
Glucose, Bld: 119 mg/dL — ABNORMAL HIGH (ref 65–99)
POTASSIUM: 3.9 mmol/L (ref 3.5–5.1)
Sodium: 135 mmol/L (ref 135–145)
TOTAL PROTEIN: 7.8 g/dL (ref 6.5–8.1)

## 2017-09-21 LAB — CBC
HEMATOCRIT: 44.2 % (ref 36.0–46.0)
Hemoglobin: 15.3 g/dL — ABNORMAL HIGH (ref 12.0–15.0)
MCH: 31.6 pg (ref 26.0–34.0)
MCHC: 34.6 g/dL (ref 30.0–36.0)
MCV: 91.3 fL (ref 78.0–100.0)
Platelets: 382 10*3/uL (ref 150–400)
RBC: 4.84 MIL/uL (ref 3.87–5.11)
RDW: 12.5 % (ref 11.5–15.5)
WBC: 9.9 10*3/uL (ref 4.0–10.5)

## 2017-09-21 LAB — RAPID URINE DRUG SCREEN, HOSP PERFORMED
Amphetamines: POSITIVE — AB
BENZODIAZEPINES: NOT DETECTED
Barbiturates: NOT DETECTED
Cocaine: POSITIVE — AB
Opiates: NOT DETECTED
TETRAHYDROCANNABINOL: POSITIVE — AB

## 2017-09-21 LAB — I-STAT BETA HCG BLOOD, ED (MC, WL, AP ONLY): I-stat hCG, quantitative: <5 m[IU]/mL (ref <5)

## 2017-09-21 LAB — LIPASE, BLOOD: Lipase: 23 U/L (ref 11–51)

## 2017-09-21 MED ORDER — PANTOPRAZOLE SODIUM 40 MG PO TBEC
40.0000 mg | DELAYED_RELEASE_TABLET | Freq: Once | ORAL | Status: AC
Start: 2017-09-21 — End: 2017-09-21
  Administered 2017-09-21: 40 mg via ORAL
  Filled 2017-09-21: qty 1

## 2017-09-21 MED ORDER — ONDANSETRON HCL 4 MG PO TABS
4.0000 mg | ORAL_TABLET | Freq: Once | ORAL | Status: DC
Start: 2017-09-21 — End: 2017-09-21

## 2017-09-21 MED ORDER — GI COCKTAIL ~~LOC~~
30.0000 mL | Freq: Once | ORAL | Status: AC
  Administered 2017-09-21: 30 mL via ORAL
  Filled 2017-09-21: qty 30

## 2017-09-21 MED ORDER — ONDANSETRON 4 MG PO TBDP: 4.0000 mg | ORAL_TABLET | Freq: Three times a day (TID) | ORAL | 0 refills | Status: DC | PRN

## 2017-09-21 MED ORDER — FAMOTIDINE 40 MG PO TABS: 40.0000 mg | ORAL_TABLET | Freq: Every day | ORAL | 0 refills | Status: DC

## 2017-09-21 NOTE — Discharge Instructions (Addendum)
Use Zofran as needed for nausea or vomiting. Take Pepcid daily for the next 2 weeks. Use Tylenol or ibuprofen as needed for pain. Make sure you are staying well-hydrated with water. Stop using drugs. Follow-up with your primary care doctor if your symptoms are not improving. Return to the emergency room if you develop persistent high fevers, persistent severe abdominal pain, difficulty breathing, or any new or concerning symptoms.
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
## 2017-09-21 NOTE — ED Triage Notes (Signed)
Pt arrived via EMS from . Pt is c/o generalized abdominal; pain that started at 2 am and has worsened. Pt reports associated n/v/d.  Pt reports that symptom started a week ago.  EMS v/s 126/80 HR 83, RR 98% RA CBG 101.

## 2017-10-12 ENCOUNTER — Encounter (HOSPITAL_COMMUNITY): Payer: Self-pay | Admitting: Emergency Medicine

## 2017-10-12 ENCOUNTER — Emergency Department (HOSPITAL_COMMUNITY)
Admission: EM | Admit: 2017-10-12 | Discharge: 2017-10-12 | Disposition: A | Discharge status: Home / Self Care | Payer: Self-pay | Attending: Emergency Medicine | Admitting: Emergency Medicine

## 2017-10-12 DIAGNOSIS — Z87891 Personal history of nicotine dependence: Secondary | ICD-10-CM | POA: Insufficient documentation

## 2017-10-12 DIAGNOSIS — R569 Unspecified convulsions: Secondary | ICD-10-CM | POA: Insufficient documentation

## 2017-10-12 DIAGNOSIS — Z9119 Patient's noncompliance with other medical treatment and regimen: Secondary | ICD-10-CM | POA: Insufficient documentation

## 2017-10-12 DIAGNOSIS — Z79899 Other long term (current) drug therapy: Secondary | ICD-10-CM | POA: Insufficient documentation

## 2017-10-12 DIAGNOSIS — Z91199 Patient's noncompliance with other medical treatment and regimen due to unspecified reason: Secondary | ICD-10-CM

## 2017-10-12 LAB — URINALYSIS, ROUTINE W REFLEX MICROSCOPIC
Bacteria, UA: NONE SEEN
Bilirubin Urine: NEGATIVE
GLUCOSE, UA: NEGATIVE mg/dL
HGB URINE DIPSTICK: NEGATIVE
Ketones, ur: NEGATIVE mg/dL
NITRITE: NEGATIVE
PH: 7 (ref 5.0–8.0)
PROTEIN: NEGATIVE mg/dL
Specific Gravity, Urine: 1.013 (ref 1.005–1.030)

## 2017-10-12 LAB — COMPREHENSIVE METABOLIC PANEL
ALK PHOS: 70 U/L (ref 38–126)
ALT: 65 U/L — ABNORMAL HIGH (ref 14–54)
ANION GAP: 9 (ref 5–15)
AST: 39 U/L (ref 15–41)
Albumin: 3.8 g/dL (ref 3.5–5.0)
BILIRUBIN TOTAL: 0.5 mg/dL (ref 0.3–1.2)
BUN: 6 mg/dL (ref 6–20)
CALCIUM: 9.1 mg/dL (ref 8.9–10.3)
CO2: 23 mmol/L (ref 22–32)
Chloride: 107 mmol/L (ref 101–111)
Creatinine, Ser: 0.58 mg/dL (ref 0.44–1.00)
Glucose, Bld: 101 mg/dL — ABNORMAL HIGH (ref 65–99)
Potassium: 3.8 mmol/L (ref 3.5–5.1)
SODIUM: 139 mmol/L (ref 135–145)
Total Protein: 8 g/dL (ref 6.5–8.1)

## 2017-10-12 LAB — CBC
HCT: 43.5 % (ref 36.0–46.0)
HEMOGLOBIN: 15.1 g/dL — AB (ref 12.0–15.0)
MCH: 31.7 pg (ref 26.0–34.0)
MCHC: 34.7 g/dL (ref 30.0–36.0)
MCV: 91.4 fL (ref 78.0–100.0)
Platelets: 386 10*3/uL (ref 150–400)
RBC: 4.76 MIL/uL (ref 3.87–5.11)
RDW: 12.5 % (ref 11.5–15.5)
WBC: 9.6 10*3/uL (ref 4.0–10.5)

## 2017-10-12 LAB — VALPROIC ACID LEVEL: VALPROIC ACID LVL: 52 ug/mL (ref 50.0–100.0)

## 2017-10-12 LAB — CBG MONITORING, ED: Glucose-Capillary: 114 mg/dL — ABNORMAL HIGH (ref 65–99)

## 2017-10-12 LAB — I-STAT BETA HCG BLOOD, ED (MC, WL, AP ONLY): I-stat hCG, quantitative: <5 m[IU]/mL (ref <5)

## 2017-10-12 LAB — LIPASE, BLOOD: Lipase: 20 U/L (ref 11–51)

## 2017-10-12 LAB — PHENYTOIN LEVEL, TOTAL
Phenytoin Lvl: <2.5 ug/mL — ABNORMAL LOW (ref 10.0–20.0)
Phenytoin Lvl: 17.5 ug/mL (ref 10.0–20.0)

## 2017-10-12 MED ORDER — ONDANSETRON HCL 4 MG/2ML IJ SOLN
INTRAMUSCULAR | Status: AC
  Administered 2017-10-12: 4 mg via INTRAVENOUS
  Filled 2017-10-12: qty 2

## 2017-10-12 MED ORDER — ONDANSETRON 4 MG PO TBDP: 4.0000 mg | ORAL_TABLET | Freq: Three times a day (TID) | ORAL | 0 refills | Status: DC | PRN

## 2017-10-12 MED ORDER — DIVALPROEX SODIUM 500 MG PO DR TAB: 1000.0000 mg | DELAYED_RELEASE_TABLET | Freq: Every day | ORAL | 0 refills | Status: DC

## 2017-10-12 MED ORDER — PHENYTOIN SODIUM EXTENDED 100 MG PO CAPS: 100.0000 mg | ORAL_CAPSULE | Freq: Three times a day (TID) | ORAL | 0 refills | Status: DC

## 2017-10-12 MED ORDER — VALPROATE SODIUM 500 MG/5ML IV SOLN: 1000.0000 mg | Freq: Once | INTRAVENOUS | Status: DC

## 2017-10-12 MED ORDER — ONDANSETRON HCL 4 MG/2ML IJ SOLN
4.0000 mg | Freq: Once | INTRAMUSCULAR | Status: AC
  Administered 2017-10-12: 4 mg via INTRAVENOUS
  Filled 2017-10-12: qty 2

## 2017-10-12 MED ORDER — SODIUM CHLORIDE 0.9 % IV SOLN
8.0000 mg | Freq: Once | INTRAVENOUS | Status: DC
  Filled 2017-10-12: qty 4

## 2017-10-12 MED ORDER — VALPROATE SODIUM 500 MG/5ML IV SOLN
1000.0000 mg | Freq: Once | INTRAVENOUS | Status: AC
  Administered 2017-10-12: 1000 mg via INTRAVENOUS
  Filled 2017-10-12: qty 10

## 2017-10-12 MED ORDER — SODIUM CHLORIDE 0.9 % IV BOLUS
1000.0000 mL | Freq: Once | INTRAVENOUS | Status: AC
  Administered 2017-10-12: 1000 mL via INTRAVENOUS

## 2017-10-12 MED ORDER — SODIUM CHLORIDE 0.9 % IV SOLN
2000.0000 mg | Freq: Once | INTRAVENOUS | Status: AC
  Administered 2017-10-12: 2000 mg via INTRAVENOUS
  Filled 2017-10-12: qty 40

## 2017-10-12 MED ORDER — ONDANSETRON HCL 4 MG/2ML IJ SOLN
4.0000 mg | Freq: Once | INTRAMUSCULAR | Status: AC
  Administered 2017-10-12: 4 mg via INTRAVENOUS

## 2017-10-12 NOTE — ED Notes (Signed)
Patient found vomiting. Alerted Claudia, PA. IV zofran given. Dilantin finished and stopped Depacon. Depacon was almost finished.

## 2017-10-12 NOTE — ED Notes (Signed)
Patient did not tolerate sprite. Patient threw up again. PA Claudia notified.

## 2017-10-12 NOTE — Discharge Instructions (Addendum)
Pick up your medicines that MAP as soon as possible.  Stay well-hydrated.  Zofran for nausea.  As we discussed, there was some signs of infection in your urine however you have no symptoms.  We will send urine for culture to confirm a urinary tract infection.  You will be notified via phone call in the next 48 hours if you need antibiotics.  Return for fevers, neck pain or stiffness, vision changes, one-sided numbness or weakness, abdominal pain, flank pain, burning with urination.

## 2017-10-12 NOTE — ED Notes (Signed)
Patient states she had 2 seizures today and 3 yesterday. Patient does not remember how long they were. Denies n/v and diarrhea.

## 2017-10-12 NOTE — ED Notes (Addendum)
Patient says she feels much better and requested sprite to drink. PA Claudia agreed. Will see how patient tolerates.
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
## 2017-10-12 NOTE — ED Triage Notes (Signed)
Per EMS, [place] bus [place], c/o focal seizure with concerns "she may have grand mal seizure." Reports she has been out of seizure meds x2 weeks. Also c/o abdominal pain with lower back pain x2 days. Ambulatory. Denies N/V/D.

## 2017-10-12 NOTE — ED Notes (Signed)
Pt ambulated from the room to the bathroom without any help and back to the room

## 2017-10-14 LAB — URINE CULTURE: Culture: <10000 — AB

## 2017-12-24 ENCOUNTER — Emergency Department (HOSPITAL_COMMUNITY)
Admission: EM | Admit: 2017-12-24 | Discharge: 2017-12-24 | Disposition: A | Discharge status: Home / Self Care | Payer: Self-pay | Attending: Emergency Medicine | Admitting: Emergency Medicine

## 2017-12-24 ENCOUNTER — Other Ambulatory Visit: Payer: Self-pay

## 2017-12-24 ENCOUNTER — Encounter (HOSPITAL_COMMUNITY): Payer: Self-pay

## 2017-12-24 DIAGNOSIS — Y999 Unspecified external cause status: Secondary | ICD-10-CM | POA: Insufficient documentation

## 2017-12-24 DIAGNOSIS — Z87891 Personal history of nicotine dependence: Secondary | ICD-10-CM | POA: Insufficient documentation

## 2017-12-24 DIAGNOSIS — S0012XA Contusion of left eyelid and periocular area, initial encounter: Secondary | ICD-10-CM | POA: Insufficient documentation

## 2017-12-24 DIAGNOSIS — Z9119 Patient's noncompliance with other medical treatment and regimen: Secondary | ICD-10-CM

## 2017-12-24 DIAGNOSIS — Z79899 Other long term (current) drug therapy: Secondary | ICD-10-CM | POA: Insufficient documentation

## 2017-12-24 DIAGNOSIS — W1839XA Other fall on same level, initial encounter: Secondary | ICD-10-CM | POA: Insufficient documentation

## 2017-12-24 DIAGNOSIS — R569 Unspecified convulsions: Secondary | ICD-10-CM | POA: Insufficient documentation

## 2017-12-24 DIAGNOSIS — Y9389 Activity, other specified: Secondary | ICD-10-CM | POA: Insufficient documentation

## 2017-12-24 DIAGNOSIS — Z91199 Patient's noncompliance with other medical treatment and regimen due to unspecified reason: Secondary | ICD-10-CM

## 2017-12-24 DIAGNOSIS — Y92009 Unspecified place in unspecified non-institutional (private) residence as the place of occurrence of the external cause: Secondary | ICD-10-CM | POA: Insufficient documentation

## 2017-12-24 MED ORDER — IBUPROFEN 800 MG PO TABS
800.0000 mg | ORAL_TABLET | Freq: Once | ORAL | Status: AC
  Administered 2017-12-24: 800 mg via ORAL
  Filled 2017-12-24: qty 1

## 2017-12-24 MED ORDER — DIVALPROEX SODIUM 500 MG PO DR TAB
1000.0000 mg | DELAYED_RELEASE_TABLET | Freq: Once | ORAL | Status: AC
  Administered 2017-12-24: 1000 mg via ORAL
  Filled 2017-12-24: qty 2

## 2017-12-24 MED ORDER — PHENYTOIN SODIUM EXTENDED 100 MG PO CAPS
100.0000 mg | ORAL_CAPSULE | Freq: Once | ORAL | Status: AC
  Administered 2017-12-24: 100 mg via ORAL
  Filled 2017-12-24: qty 1
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
## 2017-12-24 NOTE — ED Notes (Signed)
Bed: WA03 Expected date:  Expected time:  Means of arrival:  Comments: EMS-sz

## 2017-12-24 NOTE — Discharge Instructions (Addendum)
1.  If you do not take your seizure medications and if you have any problems with substance abuse, you will continue to have seizures.  This may result in serious injury or persisting seizures that could lead to permanent disability or death.  It is extremely important that you see your doctor for continuous care and monitoring.
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
## 2018-08-17 ENCOUNTER — Emergency Department (HOSPITAL_COMMUNITY)
Admission: EM | Admit: 2018-08-17 | Discharge: 2018-08-17 | Disposition: A | Discharge status: Home / Self Care | Payer: Self-pay | Attending: Emergency Medicine | Admitting: Emergency Medicine

## 2018-08-17 ENCOUNTER — Other Ambulatory Visit: Payer: Self-pay

## 2018-08-17 ENCOUNTER — Emergency Department (HOSPITAL_COMMUNITY): Payer: Self-pay

## 2018-08-17 DIAGNOSIS — S4992XA Unspecified injury of left shoulder and upper arm, initial encounter: Secondary | ICD-10-CM | POA: Insufficient documentation

## 2018-08-17 DIAGNOSIS — Y929 Unspecified place or not applicable: Secondary | ICD-10-CM | POA: Insufficient documentation

## 2018-08-17 DIAGNOSIS — X58XXXA Exposure to other specified factors, initial encounter: Secondary | ICD-10-CM | POA: Insufficient documentation

## 2018-08-17 DIAGNOSIS — F419 Anxiety disorder, unspecified: Secondary | ICD-10-CM | POA: Insufficient documentation

## 2018-08-17 DIAGNOSIS — Y998 Other external cause status: Secondary | ICD-10-CM | POA: Insufficient documentation

## 2018-08-17 DIAGNOSIS — F329 Major depressive disorder, single episode, unspecified: Secondary | ICD-10-CM | POA: Insufficient documentation

## 2018-08-17 DIAGNOSIS — R569 Unspecified convulsions: Secondary | ICD-10-CM | POA: Insufficient documentation

## 2018-08-17 DIAGNOSIS — Z87891 Personal history of nicotine dependence: Secondary | ICD-10-CM | POA: Insufficient documentation

## 2018-08-17 DIAGNOSIS — Z79899 Other long term (current) drug therapy: Secondary | ICD-10-CM | POA: Insufficient documentation

## 2018-08-17 DIAGNOSIS — Y9389 Activity, other specified: Secondary | ICD-10-CM | POA: Insufficient documentation

## 2018-08-17 LAB — CBC
HCT: 43.4 % (ref 36.0–46.0)
Hemoglobin: 14.3 g/dL (ref 12.0–15.0)
MCH: 30.7 pg (ref 26.0–34.0)
MCHC: 32.9 g/dL (ref 30.0–36.0)
MCV: 93.1 fL (ref 80.0–100.0)
Platelets: 395 10*3/uL (ref 150–400)
RBC: 4.66 MIL/uL (ref 3.87–5.11)
RDW: 12.2 % (ref 11.5–15.5)
WBC: 9 10*3/uL (ref 4.0–10.5)
nRBC: 0 % (ref 0.0–0.2)

## 2018-08-17 LAB — BASIC METABOLIC PANEL
Anion gap: 8 (ref 5–15)
BUN: 8 mg/dL (ref 6–20)
CO2: 24 mmol/L (ref 22–32)
Calcium: 9 mg/dL (ref 8.9–10.3)
Chloride: 103 mmol/L (ref 98–111)
Creatinine, Ser: 0.61 mg/dL (ref 0.44–1.00)
GFR calc Af Amer: >60 mL/min (ref >60)
Glucose, Bld: 93 mg/dL (ref 70–99)
Potassium: 3.5 mmol/L (ref 3.5–5.1)
Sodium: 135 mmol/L (ref 135–145)

## 2018-08-17 LAB — PHENYTOIN LEVEL, TOTAL

## 2018-08-17 LAB — VALPROIC ACID LEVEL: Valproic Acid Lvl: <10 ug/mL — ABNORMAL LOW (ref 50.0–100.0)

## 2018-08-17 MED ORDER — KETOROLAC TROMETHAMINE 15 MG/ML IJ SOLN
15.0000 mg | Freq: Once | INTRAMUSCULAR | Status: AC
  Administered 2018-08-17: 15 mg via INTRAVENOUS
  Filled 2018-08-17: qty 1

## 2018-08-17 MED ORDER — DIVALPROEX SODIUM 500 MG PO DR TAB
1000.0000 mg | DELAYED_RELEASE_TABLET | Freq: Once | ORAL | Status: AC
  Administered 2018-08-17: 1000 mg via ORAL
  Filled 2018-08-17: qty 2

## 2018-08-17 MED ORDER — PHENYTOIN SODIUM EXTENDED 100 MG PO CAPS: 100.0000 mg | ORAL_CAPSULE | Freq: Three times a day (TID) | ORAL | 0 refills | Status: DC

## 2018-08-17 MED ORDER — IBUPROFEN 800 MG PO TABS: 800.0000 mg | ORAL_TABLET | Freq: Three times a day (TID) | ORAL | 0 refills | Status: DC | PRN

## 2018-08-17 MED ORDER — DIVALPROEX SODIUM 500 MG PO DR TAB: 1000.0000 mg | DELAYED_RELEASE_TABLET | Freq: Every day | ORAL | 0 refills | Status: DC

## 2018-08-17 MED ORDER — PHENYTOIN SODIUM EXTENDED 100 MG PO CAPS
300.0000 mg | ORAL_CAPSULE | Freq: Once | ORAL | Status: AC
  Administered 2018-08-17: 300 mg via ORAL
  Filled 2018-08-17: qty 3

## 2018-08-17 MED ORDER — SODIUM CHLORIDE 0.9% FLUSH: 3.0000 mL | Freq: Once | INTRAVENOUS | Status: DC
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
## 2018-08-25 ENCOUNTER — Emergency Department (HOSPITAL_COMMUNITY)
Admission: EM | Admit: 2018-08-25 | Discharge: 2018-08-25 | Disposition: A | Discharge status: Home / Self Care | Payer: Self-pay | Attending: Emergency Medicine | Admitting: Emergency Medicine

## 2018-08-25 ENCOUNTER — Other Ambulatory Visit: Payer: Self-pay

## 2018-08-25 ENCOUNTER — Encounter (HOSPITAL_COMMUNITY): Payer: Self-pay

## 2018-08-25 DIAGNOSIS — Z87891 Personal history of nicotine dependence: Secondary | ICD-10-CM | POA: Insufficient documentation

## 2018-08-25 DIAGNOSIS — R569 Unspecified convulsions: Secondary | ICD-10-CM | POA: Insufficient documentation

## 2018-08-25 LAB — CBC WITH DIFFERENTIAL/PLATELET
Abs Immature Granulocytes: 0.02 10*3/uL (ref 0.00–0.07)
Basophils Absolute: 0.1 10*3/uL (ref 0.0–0.1)
Basophils Relative: 1 %
Eosinophils Absolute: 0.2 10*3/uL (ref 0.0–0.5)
Eosinophils Relative: 3 %
HCT: 43.3 % (ref 36.0–46.0)
Hemoglobin: 14.2 g/dL (ref 12.0–15.0)
Immature Granulocytes: 0 %
LYMPHS PCT: 26 %
Lymphs Abs: 1.9 10*3/uL (ref 0.7–4.0)
MCH: 30.7 pg (ref 26.0–34.0)
MCHC: 32.8 g/dL (ref 30.0–36.0)
MCV: 93.5 fL (ref 80.0–100.0)
Monocytes Absolute: 0.6 10*3/uL (ref 0.1–1.0)
Monocytes Relative: 8 %
Neutro Abs: 4.6 10*3/uL (ref 1.7–7.7)
Neutrophils Relative %: 62 %
Platelets: 422 10*3/uL — ABNORMAL HIGH (ref 150–400)
RBC: 4.63 MIL/uL (ref 3.87–5.11)
RDW: 12.1 % (ref 11.5–15.5)
WBC: 7.3 10*3/uL (ref 4.0–10.5)
nRBC: 0 % (ref 0.0–0.2)

## 2018-08-25 LAB — BASIC METABOLIC PANEL
Anion gap: 8 (ref 5–15)
BUN: 6 mg/dL (ref 6–20)
CO2: 24 mmol/L (ref 22–32)
Calcium: 8.8 mg/dL — ABNORMAL LOW (ref 8.9–10.3)
Chloride: 104 mmol/L (ref 98–111)
Creatinine, Ser: 0.63 mg/dL (ref 0.44–1.00)
Glucose, Bld: 89 mg/dL (ref 70–99)
Potassium: 3.8 mmol/L (ref 3.5–5.1)
SODIUM: 136 mmol/L (ref 135–145)

## 2018-08-25 MED ORDER — VALPROATE SODIUM 500 MG/5ML IV SOLN
1000.0000 mg | Freq: Once | INTRAVENOUS | Status: AC
  Administered 2018-08-25: 1000 mg via INTRAVENOUS
  Filled 2018-08-25: qty 10

## 2018-08-25 MED ORDER — DIVALPROEX SODIUM 500 MG PO DR TAB: DELAYED_RELEASE_TABLET | ORAL | 1 refills | Status: DC

## 2018-08-25 MED ORDER — SODIUM CHLORIDE 0.9 % IV SOLN
1000.0000 mg | Freq: Once | INTRAVENOUS | Status: AC
  Administered 2018-08-25: 1000 mg via INTRAVENOUS
  Filled 2018-08-25: qty 20

## 2018-08-25 MED ORDER — PHENYTOIN SODIUM EXTENDED 100 MG PO CAPS: 100.0000 mg | ORAL_CAPSULE | Freq: Three times a day (TID) | ORAL | 1 refills | Status: DC

## 2018-08-25 NOTE — TOC Transition Note (Signed)
Transition of Care [facility]) - CM/SW Discharge Note   Patient Details  Name: Patricia Drake MRN: [id number] Date of Birth: [birth year]  [facility]) CM/SW Contact:  Oletta Cohn, RN Phone Number: 08/25/2018, 3:42 PM   Clinical Narrative:    [facility] consulted regarding pt needing seizure Rx.   Final next level of care: Home/Self Care Barriers to Discharge: Barriers Resolved   Patient Goals and CMS Choice Patient states their goals for this hospitalization and ongoing recovery are:: get seizure meds      Discharge Placement                       Discharge Plan and Services Discharge Planning Services: CM Consult, MATCH Program                      Social Determinants of Health (SDOH) Interventions     Readmission Risk Interventions No flowsheet data found.

## 2018-08-25 NOTE — ED Notes (Signed)
Bed: [facility] Expected date:  Expected time:  Means of arrival:  Comments: Hold for room 8

## 2018-08-25 NOTE — Discharge Instructions (Addendum)
Get your medicines at [facility].  Follow-up with your provider in the next week or 2
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
## 2018-11-01 ENCOUNTER — Encounter (HOSPITAL_COMMUNITY): Payer: Self-pay

## 2018-11-01 ENCOUNTER — Emergency Department (HOSPITAL_COMMUNITY)
Admission: EM | Admit: 2018-11-01 | Discharge: 2018-11-01 | Disposition: A | Discharge status: Home / Self Care | Payer: Self-pay | Attending: Emergency Medicine | Admitting: Emergency Medicine

## 2018-11-01 ENCOUNTER — Other Ambulatory Visit: Payer: Self-pay

## 2018-11-01 DIAGNOSIS — R51 Headache: Secondary | ICD-10-CM | POA: Insufficient documentation

## 2018-11-01 DIAGNOSIS — Z8669 Personal history of other diseases of the nervous system and sense organs: Secondary | ICD-10-CM | POA: Insufficient documentation

## 2018-11-01 DIAGNOSIS — Y929 Unspecified place or not applicable: Secondary | ICD-10-CM | POA: Insufficient documentation

## 2018-11-01 DIAGNOSIS — Y939 Activity, unspecified: Secondary | ICD-10-CM | POA: Insufficient documentation

## 2018-11-01 DIAGNOSIS — Z79899 Other long term (current) drug therapy: Secondary | ICD-10-CM | POA: Insufficient documentation

## 2018-11-01 DIAGNOSIS — Z87891 Personal history of nicotine dependence: Secondary | ICD-10-CM | POA: Insufficient documentation

## 2018-11-01 DIAGNOSIS — Y999 Unspecified external cause status: Secondary | ICD-10-CM | POA: Insufficient documentation

## 2018-11-01 MED ORDER — ACETAMINOPHEN 325 MG PO TABS
650.0000 mg | ORAL_TABLET | Freq: Once | ORAL | Status: AC
  Administered 2018-11-01: 15:00:00 650 mg via ORAL
  Filled 2018-11-01: qty 2
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
## 2018-11-01 NOTE — Discharge Instructions (Addendum)
Please read attached information. If you experience any new or worsening signs or symptoms please return to the emergency room for evaluation. Please follow-up with your primary care provider or specialist as discussed. Please use tylenol as needed for pain.  °

## 2018-11-01 NOTE — ED Notes (Signed)
Up to BR given crackers and ginger ale [place]wich
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
## 2018-12-04 ENCOUNTER — Encounter (HOSPITAL_COMMUNITY): Payer: Self-pay | Admitting: Emergency Medicine

## 2018-12-04 ENCOUNTER — Other Ambulatory Visit: Payer: Self-pay

## 2018-12-04 ENCOUNTER — Emergency Department (HOSPITAL_COMMUNITY)
Admission: EM | Admit: 2018-12-04 | Discharge: 2018-12-04 | Disposition: A | Discharge status: Home / Self Care | Payer: Self-pay | Attending: Emergency Medicine | Admitting: Emergency Medicine

## 2018-12-04 DIAGNOSIS — Z79899 Other long term (current) drug therapy: Secondary | ICD-10-CM | POA: Insufficient documentation

## 2018-12-04 DIAGNOSIS — L309 Dermatitis, unspecified: Secondary | ICD-10-CM | POA: Insufficient documentation

## 2018-12-04 DIAGNOSIS — Z87891 Personal history of nicotine dependence: Secondary | ICD-10-CM | POA: Insufficient documentation

## 2018-12-04 DIAGNOSIS — E669 Obesity, unspecified: Secondary | ICD-10-CM | POA: Insufficient documentation

## 2018-12-04 MED ORDER — HYDROXYZINE HCL 10 MG PO TABS: 10.0000 mg | ORAL_TABLET | Freq: Four times a day (QID) | ORAL | 0 refills | Status: DC | PRN

## 2018-12-04 MED ORDER — DIPHENHYDRAMINE-ZINC ACETATE 2-0.1 % EX CREA
TOPICAL_CREAM | Freq: Two times a day (BID) | CUTANEOUS | Status: DC | PRN
  Filled 2018-12-04: qty 28

## 2018-12-04 MED ORDER — HYDROXYZINE HCL 10 MG PO TABS
10.0000 mg | ORAL_TABLET | Freq: Once | ORAL | Status: AC
  Administered 2018-12-04: 10 mg via ORAL
  Filled 2018-12-04: qty 1

## 2018-12-04 NOTE — ED Triage Notes (Signed)
Pt reports itching all over esp on neck for 2 days. Thinks has poison oak.

## 2018-12-04 NOTE — Discharge Instructions (Signed)
Please be sure to obtain your medication from the pharmacy, and use the provided cream up to 3 times daily for additional itching relief.  Return here for concerning changes in your condition.
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
## 2018-12-31 ENCOUNTER — Encounter (HOSPITAL_COMMUNITY): Payer: Self-pay

## 2018-12-31 ENCOUNTER — Other Ambulatory Visit: Payer: Self-pay

## 2018-12-31 ENCOUNTER — Emergency Department (HOSPITAL_COMMUNITY)
Admission: EM | Admit: 2018-12-31 | Discharge: 2019-01-01 | Disposition: A | Discharge status: Home / Self Care | Payer: Self-pay | Attending: Emergency Medicine | Admitting: Emergency Medicine

## 2018-12-31 DIAGNOSIS — F1590 Other stimulant use, unspecified, uncomplicated: Secondary | ICD-10-CM | POA: Insufficient documentation

## 2018-12-31 DIAGNOSIS — R569 Unspecified convulsions: Secondary | ICD-10-CM | POA: Insufficient documentation

## 2018-12-31 DIAGNOSIS — Z79899 Other long term (current) drug therapy: Secondary | ICD-10-CM | POA: Insufficient documentation

## 2018-12-31 DIAGNOSIS — F1290 Cannabis use, unspecified, uncomplicated: Secondary | ICD-10-CM | POA: Insufficient documentation

## 2018-12-31 DIAGNOSIS — Z87891 Personal history of nicotine dependence: Secondary | ICD-10-CM | POA: Insufficient documentation

## 2018-12-31 DIAGNOSIS — Z9114 Patient's other noncompliance with medication regimen: Secondary | ICD-10-CM | POA: Insufficient documentation
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
## 2019-01-01 DIAGNOSIS — Z9114 Patient's other noncompliance with medication regimen: Secondary | ICD-10-CM

## 2019-01-01 LAB — COMPREHENSIVE METABOLIC PANEL
ALT: 19 U/L (ref 0–44)
AST: 21 U/L (ref 15–41)
Albumin: 3.6 g/dL (ref 3.5–5.0)
Alkaline Phosphatase: 68 U/L (ref 38–126)
Anion gap: 9 (ref 5–15)
BUN: 11 mg/dL (ref 6–20)
CO2: 24 mmol/L (ref 22–32)
Calcium: 8.7 mg/dL — ABNORMAL LOW (ref 8.9–10.3)
Chloride: 107 mmol/L (ref 98–111)
Creatinine, Ser: 0.76 mg/dL (ref 0.44–1.00)
GFR calc Af Amer: >60 mL/min (ref >60)
GFR calc non Af Amer: >60 mL/min (ref >60)
Glucose, Bld: 94 mg/dL (ref 70–99)
Potassium: 3.6 mmol/L (ref 3.5–5.1)
Sodium: 140 mmol/L (ref 135–145)
Total Bilirubin: 0.3 mg/dL (ref 0.3–1.2)
Total Protein: 7.4 g/dL (ref 6.5–8.1)

## 2019-01-01 LAB — CBC WITH DIFFERENTIAL/PLATELET
Abs Immature Granulocytes: 0.01 10*3/uL (ref 0.00–0.07)
Basophils Absolute: 0.1 10*3/uL (ref 0.0–0.1)
Basophils Relative: 1 %
Eosinophils Absolute: 0.4 10*3/uL (ref 0.0–0.5)
Eosinophils Relative: 5 %
HCT: 41.1 % (ref 36.0–46.0)
Hemoglobin: 13.4 g/dL (ref 12.0–15.0)
Immature Granulocytes: 0 %
Lymphocytes Relative: 41 %
Lymphs Abs: 3.3 10*3/uL (ref 0.7–4.0)
MCH: 30.7 pg (ref 26.0–34.0)
MCHC: 32.6 g/dL (ref 30.0–36.0)
MCV: 94.1 fL (ref 80.0–100.0)
Monocytes Absolute: 0.7 10*3/uL (ref 0.1–1.0)
Monocytes Relative: 9 %
Neutro Abs: 3.7 10*3/uL (ref 1.7–7.7)
Neutrophils Relative %: 44 %
Platelets: 372 10*3/uL (ref 150–400)
RBC: 4.37 MIL/uL (ref 3.87–5.11)
RDW: 12.9 % (ref 11.5–15.5)
WBC: 8.2 10*3/uL (ref 4.0–10.5)
nRBC: 0 % (ref 0.0–0.2)

## 2019-01-01 LAB — RAPID URINE DRUG SCREEN, HOSP PERFORMED
Amphetamines: POSITIVE — AB
Barbiturates: NOT DETECTED
Benzodiazepines: NOT DETECTED
Cocaine: NOT DETECTED
Opiates: NOT DETECTED
Tetrahydrocannabinol: POSITIVE — AB

## 2019-01-01 LAB — VALPROIC ACID LEVEL: Valproic Acid Lvl: <10 ug/mL — ABNORMAL LOW (ref 50.0–100.0)

## 2019-01-01 LAB — ETHANOL: Alcohol, Ethyl (B): <10 mg/dL (ref <10)

## 2019-01-01 LAB — I-STAT BETA HCG BLOOD, ED (MC, WL, AP ONLY): I-stat hCG, quantitative: <5 m[IU]/mL (ref <5)

## 2019-01-01 LAB — PHENYTOIN LEVEL, TOTAL: Phenytoin Lvl: <2.5 ug/mL — ABNORMAL LOW (ref 10.0–20.0)

## 2019-01-01 MED ORDER — VALPROATE SODIUM 500 MG/5ML IV SOLN
500.0000 mg | Freq: Once | INTRAVENOUS | Status: AC
  Administered 2019-01-01: 500 mg via INTRAVENOUS
  Filled 2019-01-01: qty 5

## 2019-01-01 MED ORDER — DIVALPROEX SODIUM 500 MG PO DR TAB: DELAYED_RELEASE_TABLET | ORAL | 0 refills | Status: DC

## 2019-01-01 MED ORDER — KETOROLAC TROMETHAMINE 30 MG/ML IJ SOLN
30.0000 mg | Freq: Once | INTRAMUSCULAR | Status: AC
  Administered 2019-01-01: 30 mg via INTRAVENOUS
  Filled 2019-01-01: qty 1

## 2019-01-01 MED ORDER — SODIUM CHLORIDE 0.9 % IV SOLN
1000.0000 mg | Freq: Once | INTRAVENOUS | Status: AC
  Administered 2019-01-01: 1000 mg via INTRAVENOUS
  Filled 2019-01-01: qty 20

## 2019-01-01 MED ORDER — PHENYTOIN SODIUM EXTENDED 100 MG PO CAPS: 100.0000 mg | ORAL_CAPSULE | Freq: Three times a day (TID) | ORAL | 0 refills | Status: DC

## 2019-01-01 NOTE — Discharge Instructions (Addendum)
You must take your medication to prevent seizures.  Use GoodRx.com to get the best price for your medication.
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
## 2019-07-24 ENCOUNTER — Encounter (HOSPITAL_COMMUNITY): Payer: Self-pay | Admitting: Emergency Medicine

## 2019-07-24 ENCOUNTER — Other Ambulatory Visit: Payer: Self-pay

## 2019-07-24 ENCOUNTER — Emergency Department (HOSPITAL_COMMUNITY)
Admission: EM | Admit: 2019-07-24 | Discharge: 2019-07-24 | Disposition: A | Discharge status: Home / Self Care | Payer: Self-pay | Attending: Emergency Medicine | Admitting: Emergency Medicine

## 2019-07-24 DIAGNOSIS — I808 Phlebitis and thrombophlebitis of other sites: Secondary | ICD-10-CM | POA: Insufficient documentation

## 2019-07-24 DIAGNOSIS — I809 Phlebitis and thrombophlebitis of unspecified site: Secondary | ICD-10-CM

## 2019-07-24 MED ORDER — DOXYCYCLINE HYCLATE 100 MG PO CAPS: 100.0000 mg | ORAL_CAPSULE | Freq: Two times a day (BID) | ORAL | 0 refills | Status: DC

## 2019-07-24 MED ORDER — LIDOCAINE HCL (PF) 1 % IJ SOLN
5.0000 mL | Freq: Once | INTRAMUSCULAR | Status: DC
  Filled 2019-07-24: qty 5

## 2019-07-24 MED ORDER — KETOROLAC TROMETHAMINE 30 MG/ML IJ SOLN
30.0000 mg | Freq: Once | INTRAMUSCULAR | Status: AC
  Administered 2019-07-24: 10:00:00 30 mg via INTRAMUSCULAR
  Filled 2019-07-24: qty 1

## 2019-07-24 NOTE — Discharge Instructions (Signed)
There appears to been a clot and infection in your hand from injecting drugs.  Take the antibiotics.  Have the wound rechecked again in 2 to 3 days either in the ER or at your primary care doctor.  Return sooner for worsening redness or swelling.
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
## 2019-07-24 NOTE — ED Triage Notes (Signed)
Pt noticed some swelling to her left hand 3 days ago. Denies any trauma.

## 2019-09-27 ENCOUNTER — Other Ambulatory Visit: Payer: Self-pay

## 2019-09-27 ENCOUNTER — Emergency Department (HOSPITAL_COMMUNITY)
Admission: EM | Admit: 2019-09-27 | Discharge: 2019-09-28 | Disposition: A | Discharge status: Home / Self Care | Payer: Self-pay | Attending: Emergency Medicine | Admitting: Emergency Medicine

## 2019-09-27 DIAGNOSIS — Z9114 Patient's other noncompliance with medication regimen: Secondary | ICD-10-CM | POA: Insufficient documentation

## 2019-09-27 DIAGNOSIS — Z87891 Personal history of nicotine dependence: Secondary | ICD-10-CM | POA: Insufficient documentation

## 2019-09-27 DIAGNOSIS — R569 Unspecified convulsions: Secondary | ICD-10-CM | POA: Insufficient documentation

## 2019-09-27 LAB — CBG MONITORING, ED: Glucose-Capillary: 105 mg/dL — ABNORMAL HIGH (ref 70–99)

## 2019-09-27 MED ORDER — KETOROLAC TROMETHAMINE 30 MG/ML IJ SOLN
30.0000 mg | Freq: Once | INTRAMUSCULAR | Status: AC
  Administered 2019-09-27: 30 mg via INTRAVENOUS
  Filled 2019-09-27: qty 1

## 2019-09-27 MED ORDER — LEVETIRACETAM IN NACL 1000 MG/100ML IV SOLN
1000.0000 mg | Freq: Once | INTRAVENOUS | Status: AC
  Administered 2019-09-28: 1000 mg via INTRAVENOUS
  Filled 2019-09-27: qty 100

## 2019-09-27 NOTE — ED Triage Notes (Signed)
Patient in by EMS states call was for a seizure and upon arrival she didn't appear to be post-ictal, but was altered per ems, once she was moved she was aox4, states she was bitten on right shoulder blade by a spider,that she has been placed on abx and causing a lot of pain which is the main concern for this visit.
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
## 2019-09-28 LAB — RAPID URINE DRUG SCREEN, HOSP PERFORMED
Amphetamines: POSITIVE — AB
Barbiturates: NOT DETECTED
Benzodiazepines: NOT DETECTED
Cocaine: NOT DETECTED
Opiates: NOT DETECTED
Tetrahydrocannabinol: POSITIVE — AB

## 2019-09-28 LAB — URINALYSIS, ROUTINE W REFLEX MICROSCOPIC
Bilirubin Urine: NEGATIVE
Glucose, UA: NEGATIVE mg/dL
Hgb urine dipstick: NEGATIVE
Ketones, ur: NEGATIVE mg/dL
Nitrite: NEGATIVE
Protein, ur: 30 mg/dL — AB
Specific Gravity, Urine: 1.041 — ABNORMAL HIGH (ref 1.005–1.030)
pH: 5 (ref 5.0–8.0)

## 2019-09-28 LAB — CBC WITH DIFFERENTIAL/PLATELET
Abs Immature Granulocytes: 0.04 10*3/uL (ref 0.00–0.07)
Basophils Absolute: 0.1 10*3/uL (ref 0.0–0.1)
Basophils Relative: 1 %
Eosinophils Absolute: 0.4 10*3/uL (ref 0.0–0.5)
Eosinophils Relative: 4 %
HCT: 42.1 % (ref 36.0–46.0)
Hemoglobin: 14.4 g/dL (ref 12.0–15.0)
Immature Granulocytes: 0 %
Lymphocytes Relative: 28 %
Lymphs Abs: 2.6 10*3/uL (ref 0.7–4.0)
MCH: 31.6 pg (ref 26.0–34.0)
MCHC: 34.2 g/dL (ref 30.0–36.0)
MCV: 92.5 fL (ref 80.0–100.0)
Monocytes Absolute: 0.9 10*3/uL (ref 0.1–1.0)
Monocytes Relative: 10 %
Neutro Abs: 5.4 10*3/uL (ref 1.7–7.7)
Neutrophils Relative %: 57 %
Platelets: 403 10*3/uL — ABNORMAL HIGH (ref 150–400)
RBC: 4.55 MIL/uL (ref 3.87–5.11)
RDW: 12.6 % (ref 11.5–15.5)
WBC: 9.3 10*3/uL (ref 4.0–10.5)
nRBC: 0 % (ref 0.0–0.2)

## 2019-09-28 LAB — URINE CULTURE: Culture: NO GROWTH

## 2019-09-28 LAB — COMPREHENSIVE METABOLIC PANEL
ALT: 24 U/L (ref 0–44)
AST: 22 U/L (ref 15–41)
Albumin: 3.8 g/dL (ref 3.5–5.0)
Alkaline Phosphatase: 67 U/L (ref 38–126)
Anion gap: 8 (ref 5–15)
BUN: 14 mg/dL (ref 6–20)
CO2: 26 mmol/L (ref 22–32)
Calcium: 8.9 mg/dL (ref 8.9–10.3)
Chloride: 105 mmol/L (ref 98–111)
Creatinine, Ser: 0.67 mg/dL (ref 0.44–1.00)
GFR calc Af Amer: >60 mL/min (ref >60)
GFR calc non Af Amer: >60 mL/min (ref >60)
Glucose, Bld: 112 mg/dL — ABNORMAL HIGH (ref 70–99)
Potassium: 3.5 mmol/L (ref 3.5–5.1)
Sodium: 139 mmol/L (ref 135–145)
Total Bilirubin: 0.8 mg/dL (ref 0.3–1.2)
Total Protein: 7.7 g/dL (ref 6.5–8.1)

## 2019-09-28 LAB — I-STAT BETA HCG BLOOD, ED (MC, WL, AP ONLY): I-stat hCG, quantitative: <5 m[IU]/mL (ref <5)

## 2019-09-28 LAB — ETHANOL: Alcohol, Ethyl (B): <10 mg/dL (ref <10)

## 2019-09-28 MED ORDER — LEVETIRACETAM 500 MG PO TABS: 500.0000 mg | ORAL_TABLET | Freq: Two times a day (BID) | ORAL | 1 refills | Status: DC

## 2019-09-28 NOTE — Discharge Instructions (Signed)
Please begin taking your Keppra as prescribed.  Please follow-up with your outpatient neurologist.  You cannot drive for 6 months after your last seizure.  You will need to be seen and cleared by neurology before driving again.

## 2020-05-21 ENCOUNTER — Other Ambulatory Visit: Payer: Self-pay

## 2020-05-21 ENCOUNTER — Emergency Department (HOSPITAL_COMMUNITY)
Admission: EM | Admit: 2020-05-21 | Discharge: 2020-05-21 | Disposition: A | Discharge status: Home / Self Care | Payer: Self-pay | Attending: Emergency Medicine | Admitting: Emergency Medicine

## 2020-05-21 ENCOUNTER — Encounter (HOSPITAL_COMMUNITY): Payer: Self-pay | Admitting: Emergency Medicine

## 2020-05-21 ENCOUNTER — Emergency Department (HOSPITAL_COMMUNITY): Payer: Self-pay

## 2020-05-21 DIAGNOSIS — R109 Unspecified abdominal pain: Secondary | ICD-10-CM | POA: Insufficient documentation

## 2020-05-21 DIAGNOSIS — M25511 Pain in right shoulder: Secondary | ICD-10-CM | POA: Insufficient documentation

## 2020-05-21 DIAGNOSIS — Z5321 Procedure and treatment not carried out due to patient leaving prior to being seen by health care provider: Secondary | ICD-10-CM | POA: Insufficient documentation

## 2020-05-21 LAB — I-STAT BETA HCG BLOOD, ED (MC, WL, AP ONLY): I-stat hCG, quantitative: <5 m[IU]/mL (ref <5)

## 2020-05-21 LAB — BASIC METABOLIC PANEL
Anion gap: 10 (ref 5–15)
BUN: 10 mg/dL (ref 6–20)
CO2: 25 mmol/L (ref 22–32)
Calcium: 9.1 mg/dL (ref 8.9–10.3)
Chloride: 102 mmol/L (ref 98–111)
Creatinine, Ser: 0.69 mg/dL (ref 0.44–1.00)
GFR, Estimated: >60 mL/min (ref >60)
Glucose, Bld: 91 mg/dL (ref 70–99)
Potassium: 3.6 mmol/L (ref 3.5–5.1)
Sodium: 137 mmol/L (ref 135–145)

## 2020-05-21 LAB — CBC
HCT: 44 % (ref 36.0–46.0)
Hemoglobin: 14.5 g/dL (ref 12.0–15.0)
MCH: 30.7 pg (ref 26.0–34.0)
MCHC: 33 g/dL (ref 30.0–36.0)
MCV: 93 fL (ref 80.0–100.0)
Platelets: 461 10*3/uL — ABNORMAL HIGH (ref 150–400)
RBC: 4.73 MIL/uL (ref 3.87–5.11)
RDW: 12.4 % (ref 11.5–15.5)
WBC: 8.5 10*3/uL (ref 4.0–10.5)
nRBC: 0 % (ref 0.0–0.2)

## 2020-05-21 LAB — TROPONIN I (HIGH SENSITIVITY): Troponin I (High Sensitivity): 3 ng/L (ref <18)

## 2020-05-21 NOTE — ED Triage Notes (Signed)
Arrives via EMS from the [facility], C/C R shoulder and flank pain. 2 hours ago patient was working at [employer] and her R shoulder and R flank area started hurting, patient states it feels deep and maybe musculoskeletal.

## 2020-09-14 ENCOUNTER — Encounter (HOSPITAL_COMMUNITY): Payer: Self-pay

## 2020-09-14 ENCOUNTER — Emergency Department (HOSPITAL_COMMUNITY)
Admission: EM | Admit: 2020-09-14 | Discharge: 2020-09-14 | Disposition: A | Discharge status: Home / Self Care | Payer: Self-pay | Attending: Emergency Medicine | Admitting: Emergency Medicine

## 2020-09-14 ENCOUNTER — Other Ambulatory Visit: Payer: Self-pay

## 2020-09-14 DIAGNOSIS — R5383 Other fatigue: Secondary | ICD-10-CM | POA: Insufficient documentation

## 2020-09-14 DIAGNOSIS — Z87891 Personal history of nicotine dependence: Secondary | ICD-10-CM | POA: Insufficient documentation

## 2020-09-14 DIAGNOSIS — R42 Dizziness and giddiness: Secondary | ICD-10-CM | POA: Insufficient documentation

## 2020-09-14 DIAGNOSIS — Z79899 Other long term (current) drug therapy: Secondary | ICD-10-CM | POA: Insufficient documentation

## 2020-09-14 DIAGNOSIS — G40909 Epilepsy, unspecified, not intractable, without status epilepticus: Secondary | ICD-10-CM | POA: Insufficient documentation

## 2020-09-14 LAB — CBC WITH DIFFERENTIAL/PLATELET
Abs Immature Granulocytes: 0.01 10*3/uL (ref 0.00–0.07)
Basophils Absolute: 0.1 10*3/uL (ref 0.0–0.1)
Basophils Relative: 1 %
Eosinophils Absolute: 0.2 10*3/uL (ref 0.0–0.5)
Eosinophils Relative: 3 %
HCT: 42.9 % (ref 36.0–46.0)
Hemoglobin: 14.6 g/dL (ref 12.0–15.0)
Immature Granulocytes: 0 %
Lymphocytes Relative: 31 %
Lymphs Abs: 2.6 10*3/uL (ref 0.7–4.0)
MCH: 31.5 pg (ref 26.0–34.0)
MCHC: 34 g/dL (ref 30.0–36.0)
MCV: 92.5 fL (ref 80.0–100.0)
Monocytes Absolute: 0.7 10*3/uL (ref 0.1–1.0)
Monocytes Relative: 9 %
Neutro Abs: 4.8 10*3/uL (ref 1.7–7.7)
Neutrophils Relative %: 56 %
Platelets: 433 10*3/uL — ABNORMAL HIGH (ref 150–400)
RBC: 4.64 MIL/uL (ref 3.87–5.11)
RDW: 12.6 % (ref 11.5–15.5)
WBC: 8.3 10*3/uL (ref 4.0–10.5)
nRBC: 0 % (ref 0.0–0.2)

## 2020-09-14 LAB — BASIC METABOLIC PANEL
Anion gap: 9 (ref 5–15)
BUN: 9 mg/dL (ref 6–20)
CO2: 22 mmol/L (ref 22–32)
Calcium: 9 mg/dL (ref 8.9–10.3)
Chloride: 106 mmol/L (ref 98–111)
Creatinine, Ser: 0.75 mg/dL (ref 0.44–1.00)
GFR, Estimated: >60 mL/min (ref >60)
Glucose, Bld: 92 mg/dL (ref 70–99)
Potassium: 4.2 mmol/L (ref 3.5–5.1)
Sodium: 137 mmol/L (ref 135–145)

## 2020-09-14 MED ORDER — LEVETIRACETAM 500 MG PO TABS
500.0000 mg | ORAL_TABLET | Freq: Once | ORAL | Status: AC
  Administered 2020-09-14: 500 mg via ORAL
  Filled 2020-09-14: qty 1

## 2020-09-14 NOTE — Discharge Instructions (Signed)
Follow-up with your primary doctor.  Take Keppra as prescribed.  Return if you have any new or worsening symptoms.

## 2020-09-14 NOTE — ED Triage Notes (Signed)
Pt BIB EMS from work. Pt reports not taking Depakote and Keppra for a few weeks. Pt reports having seizures yesterday, and states that she feels like she is going to have another seizure. Pt appears to be under the influence the drugs.

## 2020-09-14 NOTE — ED Notes (Signed)
Provider at bedside to assess.

## 2020-09-14 NOTE — ED Notes (Signed)
Pt is a difficult stick. Unable to obtain Keppra and Depakote levels at this time. Provider aware.

## 2020-09-14 NOTE — ED Notes (Signed)
Provider at bedside to assess. Per Provider, will d/c Keppra and Depakote level at this time.
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
## 2020-10-05 ENCOUNTER — Emergency Department (HOSPITAL_COMMUNITY)
Admission: EM | Admit: 2020-10-05 | Discharge: 2020-10-05 | Disposition: A | Discharge status: Home / Self Care | Payer: Self-pay | Attending: Emergency Medicine | Admitting: Emergency Medicine

## 2020-10-05 ENCOUNTER — Encounter (HOSPITAL_COMMUNITY): Payer: Self-pay

## 2020-10-05 ENCOUNTER — Other Ambulatory Visit: Payer: Self-pay

## 2020-10-05 DIAGNOSIS — N939 Abnormal uterine and vaginal bleeding, unspecified: Secondary | ICD-10-CM | POA: Insufficient documentation

## 2020-10-05 DIAGNOSIS — M545 Low back pain, unspecified: Secondary | ICD-10-CM | POA: Insufficient documentation

## 2020-10-05 DIAGNOSIS — N938 Other specified abnormal uterine and vaginal bleeding: Secondary | ICD-10-CM

## 2020-10-05 DIAGNOSIS — Z87891 Personal history of nicotine dependence: Secondary | ICD-10-CM | POA: Insufficient documentation

## 2020-10-05 LAB — COMPREHENSIVE METABOLIC PANEL
ALT: 15 U/L (ref 0–44)
AST: 15 U/L (ref 15–41)
Albumin: 3.2 g/dL — ABNORMAL LOW (ref 3.5–5.0)
Alkaline Phosphatase: 65 U/L (ref 38–126)
Anion gap: 6 (ref 5–15)
BUN: 5 mg/dL — ABNORMAL LOW (ref 6–20)
CO2: 24 mmol/L (ref 22–32)
Calcium: 8.3 mg/dL — ABNORMAL LOW (ref 8.9–10.3)
Chloride: 106 mmol/L (ref 98–111)
Creatinine, Ser: 0.74 mg/dL (ref 0.44–1.00)
GFR, Estimated: >60 mL/min (ref >60)
Glucose, Bld: 130 mg/dL — ABNORMAL HIGH (ref 70–99)
Potassium: 3.7 mmol/L (ref 3.5–5.1)
Sodium: 136 mmol/L (ref 135–145)
Total Bilirubin: 0.4 mg/dL (ref 0.3–1.2)
Total Protein: 6.4 g/dL — ABNORMAL LOW (ref 6.5–8.1)

## 2020-10-05 LAB — CBC WITH DIFFERENTIAL/PLATELET
Abs Immature Granulocytes: 0.02 10*3/uL (ref 0.00–0.07)
Basophils Absolute: 0.1 10*3/uL (ref 0.0–0.1)
Basophils Relative: 1 %
Eosinophils Absolute: 0.3 10*3/uL (ref 0.0–0.5)
Eosinophils Relative: 5 %
HCT: 40.5 % (ref 36.0–46.0)
Hemoglobin: 13.5 g/dL (ref 12.0–15.0)
Immature Granulocytes: 0 %
Lymphocytes Relative: 29 %
Lymphs Abs: 1.7 10*3/uL (ref 0.7–4.0)
MCH: 31.3 pg (ref 26.0–34.0)
MCHC: 33.3 g/dL (ref 30.0–36.0)
MCV: 93.8 fL (ref 80.0–100.0)
Monocytes Absolute: 0.4 10*3/uL (ref 0.1–1.0)
Monocytes Relative: 7 %
Neutro Abs: 3.4 10*3/uL (ref 1.7–7.7)
Neutrophils Relative %: 58 %
Platelets: 382 10*3/uL (ref 150–400)
RBC: 4.32 MIL/uL (ref 3.87–5.11)
RDW: 12.2 % (ref 11.5–15.5)
WBC: 5.9 10*3/uL (ref 4.0–10.5)
nRBC: 0 % (ref 0.0–0.2)

## 2020-10-05 LAB — I-STAT BETA HCG BLOOD, ED (MC, WL, AP ONLY): I-stat hCG, quantitative: <5 m[IU]/mL (ref <5)

## 2020-10-05 MED ORDER — NAPROXEN 375 MG PO TABS: 375.0000 mg | ORAL_TABLET | Freq: Two times a day (BID) | ORAL | 0 refills | Status: DC

## 2020-10-05 MED ORDER — CYCLOBENZAPRINE HCL 10 MG PO TABS: 10.0000 mg | ORAL_TABLET | Freq: Three times a day (TID) | ORAL | 0 refills | Status: DC | PRN

## 2020-10-05 MED ORDER — KETOROLAC TROMETHAMINE 15 MG/ML IJ SOLN
15.0000 mg | Freq: Once | INTRAMUSCULAR | Status: AC
  Administered 2020-10-05: 15 mg via INTRAVENOUS
  Filled 2020-10-05: qty 1

## 2020-10-05 NOTE — Discharge Instructions (Addendum)
If you develop worsening, recurrent, or continued back pain, numbness or weakness in the legs, incontinence of your bowels or bladders, numbness of your buttocks, fever, abdominal pain, or any other new/concerning symptoms then return to the ER for evaluation.   You are being prescribed naproxen. Do not take Ibuprofen/Advil/Aleve/Motrin/Goody Powders/BC powders/Meloxicam/Diclofenac/Indomethacin and other Nonsteroidal anti-inflammatory medications   
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
## 2020-10-05 NOTE — ED Notes (Signed)
Post-void residual 0mL

## 2020-10-05 NOTE — ED Triage Notes (Signed)
Per PT: Third time bleeding this month, bleeding heavy with more than normal clots. Back pain/pressure. HX of cysts on ovaries

## 2020-10-16 ENCOUNTER — Emergency Department (HOSPITAL_COMMUNITY)
Admission: EM | Admit: 2020-10-16 | Discharge: 2020-10-16 | Disposition: A | Discharge status: Home / Self Care | Payer: 59 | Attending: Emergency Medicine | Admitting: Emergency Medicine

## 2020-10-16 ENCOUNTER — Encounter (HOSPITAL_COMMUNITY): Payer: Self-pay

## 2020-10-16 DIAGNOSIS — R569 Unspecified convulsions: Secondary | ICD-10-CM

## 2020-10-16 DIAGNOSIS — Z87891 Personal history of nicotine dependence: Secondary | ICD-10-CM | POA: Diagnosis not present

## 2020-10-16 DIAGNOSIS — A599 Trichomoniasis, unspecified: Secondary | ICD-10-CM | POA: Diagnosis not present

## 2020-10-16 LAB — CBC
HCT: 43.6 % (ref 36.0–46.0)
Hemoglobin: 14.8 g/dL (ref 12.0–15.0)
MCH: 31.2 pg (ref 26.0–34.0)
MCHC: 33.9 g/dL (ref 30.0–36.0)
MCV: 91.8 fL (ref 80.0–100.0)
Platelets: 424 10*3/uL — ABNORMAL HIGH (ref 150–400)
RBC: 4.75 MIL/uL (ref 3.87–5.11)
RDW: 12.3 % (ref 11.5–15.5)
WBC: 8 10*3/uL (ref 4.0–10.5)
nRBC: 0 % (ref 0.0–0.2)

## 2020-10-16 LAB — I-STAT BETA HCG BLOOD, ED (MC, WL, AP ONLY): I-stat hCG, quantitative: <5 m[IU]/mL (ref <5)

## 2020-10-16 LAB — URINALYSIS, ROUTINE W REFLEX MICROSCOPIC
Bilirubin Urine: NEGATIVE
Glucose, UA: NEGATIVE mg/dL
Hgb urine dipstick: NEGATIVE
Ketones, ur: NEGATIVE mg/dL
Nitrite: NEGATIVE
Protein, ur: NEGATIVE mg/dL
Specific Gravity, Urine: 1.014 (ref 1.005–1.030)
pH: 6 (ref 5.0–8.0)

## 2020-10-16 LAB — BASIC METABOLIC PANEL
Anion gap: 7 (ref 5–15)
BUN: 5 mg/dL — ABNORMAL LOW (ref 6–20)
CO2: 20 mmol/L — ABNORMAL LOW (ref 22–32)
Calcium: 9.1 mg/dL (ref 8.9–10.3)
Chloride: 107 mmol/L (ref 98–111)
Creatinine, Ser: 0.57 mg/dL (ref 0.44–1.00)
GFR, Estimated: >60 mL/min (ref >60)
Glucose, Bld: 116 mg/dL — ABNORMAL HIGH (ref 70–99)
Potassium: 4.1 mmol/L (ref 3.5–5.1)
Sodium: 134 mmol/L — ABNORMAL LOW (ref 135–145)

## 2020-10-16 LAB — RAPID URINE DRUG SCREEN, HOSP PERFORMED
Amphetamines: POSITIVE — AB
Barbiturates: NOT DETECTED
Benzodiazepines: NOT DETECTED
Cocaine: NOT DETECTED
Opiates: NOT DETECTED
Tetrahydrocannabinol: POSITIVE — AB

## 2020-10-16 MED ORDER — LEVETIRACETAM IN NACL 1000 MG/100ML IV SOLN
1000.0000 mg | Freq: Once | INTRAVENOUS | Status: AC
  Administered 2020-10-16: 1000 mg via INTRAVENOUS
  Filled 2020-10-16: qty 100

## 2020-10-16 MED ORDER — METRONIDAZOLE 500 MG PO TABS: 500.0000 mg | ORAL_TABLET | Freq: Two times a day (BID) | ORAL | 0 refills | Status: DC
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
## 2020-10-16 NOTE — Discharge Instructions (Addendum)
This morning, please call your neurologist to discuss your seizure medication regimen.  Please return to the emergency room if you have any additional seizures, or develop other new concerning symptom.  The urine sample showed trichomonas.  To clear this infection.  You should take Flagyl.  Do not drink any alcohol while taking Flagyl.  Please notify all sexual partners that you have trichomonas.  They should receive a prescription as well for treatment.

## 2020-10-16 NOTE — ED Notes (Signed)
Food and drink provided to pt with permission from MD [facility]. Pt tolerating PO. Ambulatory, A&OX4.

## 2020-10-16 NOTE — ED Notes (Signed)
Seizure pads placed on pt bed. Pt A&Ox4.

## 2020-10-16 NOTE — ED Notes (Signed)
Safe transport set up for pt.

## 2020-10-22 LAB — LEVETIRACETAM LEVEL: Levetiracetam Lvl: <1 ug/mL — ABNORMAL LOW (ref 10.0–40.0)

## 2021-01-25 ENCOUNTER — Emergency Department (HOSPITAL_COMMUNITY): Payer: 59

## 2021-01-25 ENCOUNTER — Encounter (HOSPITAL_COMMUNITY): Payer: Self-pay

## 2021-01-25 ENCOUNTER — Emergency Department (HOSPITAL_COMMUNITY)
Admission: EM | Admit: 2021-01-25 | Discharge: 2021-01-25 | Disposition: A | Discharge status: Home / Self Care | Payer: 59 | Attending: Emergency Medicine | Admitting: Emergency Medicine

## 2021-01-25 ENCOUNTER — Other Ambulatory Visit: Payer: Self-pay

## 2021-01-25 DIAGNOSIS — F17210 Nicotine dependence, cigarettes, uncomplicated: Secondary | ICD-10-CM | POA: Diagnosis not present

## 2021-01-25 DIAGNOSIS — Z79899 Other long term (current) drug therapy: Secondary | ICD-10-CM | POA: Diagnosis not present

## 2021-01-25 DIAGNOSIS — R52 Pain, unspecified: Secondary | ICD-10-CM

## 2021-01-25 DIAGNOSIS — R569 Unspecified convulsions: Secondary | ICD-10-CM | POA: Diagnosis not present

## 2021-01-25 LAB — CBC WITH DIFFERENTIAL/PLATELET
Abs Immature Granulocytes: 0.03 10*3/uL (ref 0.00–0.07)
Basophils Absolute: 0.1 10*3/uL (ref 0.0–0.1)
Basophils Relative: 1 %
Eosinophils Absolute: 0.3 10*3/uL (ref 0.0–0.5)
Eosinophils Relative: 3 %
HCT: 41 % (ref 36.0–46.0)
Hemoglobin: 13.7 g/dL (ref 12.0–15.0)
Immature Granulocytes: 0 %
Lymphocytes Relative: 18 %
Lymphs Abs: 1.8 10*3/uL (ref 0.7–4.0)
MCH: 30.9 pg (ref 26.0–34.0)
MCHC: 33.4 g/dL (ref 30.0–36.0)
MCV: 92.3 fL (ref 80.0–100.0)
Monocytes Absolute: 0.7 10*3/uL (ref 0.1–1.0)
Monocytes Relative: 7 %
Neutro Abs: 7.3 10*3/uL (ref 1.7–7.7)
Neutrophils Relative %: 71 %
Platelets: 401 10*3/uL — ABNORMAL HIGH (ref 150–400)
RBC: 4.44 MIL/uL (ref 3.87–5.11)
RDW: 12.5 % (ref 11.5–15.5)
WBC: 10.2 10*3/uL (ref 4.0–10.5)
nRBC: 0 % (ref 0.0–0.2)

## 2021-01-25 LAB — COMPREHENSIVE METABOLIC PANEL
ALT: 17 U/L (ref 0–44)
AST: 19 U/L (ref 15–41)
Albumin: 3.4 g/dL — ABNORMAL LOW (ref 3.5–5.0)
Alkaline Phosphatase: 63 U/L (ref 38–126)
Anion gap: 6 (ref 5–15)
BUN: 5 mg/dL — ABNORMAL LOW (ref 6–20)
CO2: 26 mmol/L (ref 22–32)
Calcium: 8.8 mg/dL — ABNORMAL LOW (ref 8.9–10.3)
Chloride: 106 mmol/L (ref 98–111)
Creatinine, Ser: 0.79 mg/dL (ref 0.44–1.00)
GFR, Estimated: >60 mL/min (ref >60)
Glucose, Bld: 87 mg/dL (ref 70–99)
Potassium: 3.7 mmol/L (ref 3.5–5.1)
Sodium: 138 mmol/L (ref 135–145)
Total Bilirubin: 0.9 mg/dL (ref 0.3–1.2)
Total Protein: 6.8 g/dL (ref 6.5–8.1)

## 2021-01-25 LAB — I-STAT BETA HCG BLOOD, ED (MC, WL, AP ONLY): I-stat hCG, quantitative: <5 m[IU]/mL (ref <5)

## 2021-01-25 MED ORDER — FENTANYL CITRATE PF 50 MCG/ML IJ SOSY
50.0000 ug | PREFILLED_SYRINGE | Freq: Once | INTRAMUSCULAR | Status: AC
  Administered 2021-01-25: 50 ug via INTRAVENOUS
  Filled 2021-01-25: qty 1

## 2021-01-25 MED ORDER — GADOBUTROL 1 MMOL/ML IV SOLN
10.0000 mL | Freq: Once | INTRAVENOUS | Status: AC | PRN
  Administered 2021-01-25: 10 mL via INTRAVENOUS

## 2021-01-25 MED ORDER — IOHEXOL 350 MG/ML SOLN
80.0000 mL | Freq: Once | INTRAVENOUS | Status: AC | PRN
  Administered 2021-01-25: 80 mL via INTRAVENOUS

## 2021-01-25 MED ORDER — LORAZEPAM 2 MG/ML IJ SOLN
0.5000 mg | Freq: Once | INTRAMUSCULAR | Status: AC
  Administered 2021-01-25: 0.5 mg via INTRAVENOUS
  Filled 2021-01-25: qty 1

## 2021-01-25 MED ORDER — LEVETIRACETAM IN NACL 1000 MG/100ML IV SOLN
1000.0000 mg | Freq: Once | INTRAVENOUS | Status: AC
  Administered 2021-01-25: 1000 mg via INTRAVENOUS
  Filled 2021-01-25: qty 100

## 2021-01-25 MED ORDER — LEVETIRACETAM 500 MG PO TABS: 500.0000 mg | ORAL_TABLET | Freq: Two times a day (BID) | ORAL | 3 refills | Status: DC

## 2021-01-25 MED ORDER — SODIUM CHLORIDE 0.9 % IV BOLUS
1000.0000 mL | Freq: Once | INTRAVENOUS | Status: AC
  Administered 2021-01-25: 1000 mL via INTRAVENOUS

## 2021-01-25 MED ORDER — CYCLOBENZAPRINE HCL 10 MG PO TABS: 10.0000 mg | ORAL_TABLET | Freq: Once | ORAL | Status: DC

## 2021-01-25 MED ORDER — FENTANYL CITRATE PF 50 MCG/ML IJ SOSY
50.0000 ug | PREFILLED_SYRINGE | Freq: Once | INTRAMUSCULAR | Status: AC
  Administered 2021-01-25: 50 ug via INTRAVENOUS

## 2021-01-25 MED ORDER — FENTANYL CITRATE PF 50 MCG/ML IJ SOSY
100.0000 ug | PREFILLED_SYRINGE | Freq: Once | INTRAMUSCULAR | Status: DC
  Filled 2021-01-25: qty 2

## 2021-01-25 NOTE — ED Notes (Signed)
PT is not sleeping with lights dim, pure wick in place and monitor on

## 2021-01-25 NOTE — ED Notes (Signed)
PT was to be transported to MRI. When staff came in the room and woke her up pt could be heard screaming "Patricia Drake dont give a fuck about me, I hate this place I just want to get out of here." PT could eventually be talked in to going to MRI with staff
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
## 2021-01-25 NOTE — ED Notes (Signed)
Patient transported to MRI 

## 2021-01-25 NOTE — ED Notes (Signed)
Pt refusing vital recheck before MRI

## 2021-01-25 NOTE — ED Notes (Signed)
Pt back from MRI refusing vitals
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
## 2021-02-03 ENCOUNTER — Emergency Department (HOSPITAL_COMMUNITY)
Admission: EM | Admit: 2021-02-03 | Discharge: 2021-02-03 | Disposition: A | Discharge status: Home / Self Care | Payer: 59 | Attending: Emergency Medicine | Admitting: Emergency Medicine

## 2021-02-03 ENCOUNTER — Other Ambulatory Visit: Payer: Self-pay

## 2021-02-03 DIAGNOSIS — F446 Conversion disorder with sensory symptom or deficit: Secondary | ICD-10-CM | POA: Diagnosis not present

## 2021-02-03 DIAGNOSIS — R531 Weakness: Secondary | ICD-10-CM | POA: Insufficient documentation

## 2021-02-03 DIAGNOSIS — M2550 Pain in unspecified joint: Secondary | ICD-10-CM | POA: Insufficient documentation

## 2021-02-03 DIAGNOSIS — F17210 Nicotine dependence, cigarettes, uncomplicated: Secondary | ICD-10-CM | POA: Insufficient documentation

## 2021-02-03 DIAGNOSIS — R42 Dizziness and giddiness: Secondary | ICD-10-CM | POA: Diagnosis not present

## 2021-02-03 DIAGNOSIS — G40909 Epilepsy, unspecified, not intractable, without status epilepticus: Secondary | ICD-10-CM | POA: Insufficient documentation

## 2021-02-03 DIAGNOSIS — R6889 Other general symptoms and signs: Secondary | ICD-10-CM
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
## 2021-02-03 NOTE — ED Notes (Signed)
Pt intermittently saying she cannot see, she is totally numb and can't move, then to putting cell phone to ear trying to make a phone call, then laying back rolling eyes all around looking around, then tremoring. Pt VSS at this time, airway is intact, RN asking EDP to assess at this time.

## 2021-02-03 NOTE — ED Notes (Signed)
Pt was seen ambulating towards exit with significant other. RN intercepted and attempted to get pt to return to room to get labs and further test results, pt states "No, I know they're just going to say the same thing they said the other day, I just wanna go home, I have to work in the morning." Pt was amenable to signing AMA form, risks discussed and pt still insisted on leaving. Pt ambulated with steady gait to facility exit with significant other and all belongings

## 2021-02-03 NOTE — ED Triage Notes (Signed)
Pt BIB EMS for episode of weakness/numbness and possible syncope. Per EMS pt was in a park in [place] when she became numb and weak on her right side, which then proceeded to total body numbness and weakness. Pt laid down on the ground, is uncertain if she had a syncopal event while laying down but did not fall or hit head. Pt reports having a similar episode "a couple days ago." Pt does endorse marijuana use today and IV methamphetamine use yesterday. States these symptoms have happened without regard to timing of drug use.

## 2021-02-17 ENCOUNTER — Encounter (HOSPITAL_COMMUNITY): Payer: Self-pay | Admitting: Emergency Medicine

## 2021-02-17 ENCOUNTER — Other Ambulatory Visit: Payer: Self-pay

## 2021-02-17 ENCOUNTER — Emergency Department (HOSPITAL_COMMUNITY)
Admission: EM | Admit: 2021-02-17 | Discharge: 2021-02-18 | Disposition: A | Discharge status: Home / Self Care | Payer: 59 | Attending: Emergency Medicine | Admitting: Emergency Medicine

## 2021-02-17 DIAGNOSIS — F445 Conversion disorder with seizures or convulsions: Secondary | ICD-10-CM

## 2021-02-17 DIAGNOSIS — R569 Unspecified convulsions: Secondary | ICD-10-CM

## 2021-02-17 DIAGNOSIS — G40909 Epilepsy, unspecified, not intractable, without status epilepticus: Secondary | ICD-10-CM | POA: Diagnosis not present

## 2021-02-17 DIAGNOSIS — F17210 Nicotine dependence, cigarettes, uncomplicated: Secondary | ICD-10-CM | POA: Insufficient documentation

## 2021-02-17 DIAGNOSIS — Y900 Blood alcohol level of less than 20 mg/100 ml: Secondary | ICD-10-CM | POA: Insufficient documentation

## 2021-02-17 DIAGNOSIS — R45851 Suicidal ideations: Secondary | ICD-10-CM | POA: Diagnosis not present

## 2021-02-17 DIAGNOSIS — Z20822 Contact with and (suspected) exposure to covid-19: Secondary | ICD-10-CM | POA: Diagnosis not present

## 2021-02-17 DIAGNOSIS — R4689 Other symptoms and signs involving appearance and behavior: Secondary | ICD-10-CM

## 2021-02-17 DIAGNOSIS — R443 Hallucinations, unspecified: Secondary | ICD-10-CM

## 2021-02-17 LAB — RAPID URINE DRUG SCREEN, HOSP PERFORMED
Amphetamines: POSITIVE — AB
Barbiturates: NOT DETECTED
Benzodiazepines: POSITIVE — AB
Cocaine: NOT DETECTED
Opiates: NOT DETECTED
Tetrahydrocannabinol: POSITIVE — AB

## 2021-02-17 LAB — COMPREHENSIVE METABOLIC PANEL
ALT: 18 U/L (ref 0–44)
AST: 20 U/L (ref 15–41)
Albumin: 3.5 g/dL (ref 3.5–5.0)
Alkaline Phosphatase: 64 U/L (ref 38–126)
Anion gap: 7 (ref 5–15)
BUN: 8 mg/dL (ref 6–20)
CO2: 23 mmol/L (ref 22–32)
Calcium: 8.4 mg/dL — ABNORMAL LOW (ref 8.9–10.3)
Chloride: 107 mmol/L (ref 98–111)
Creatinine, Ser: 0.51 mg/dL (ref 0.44–1.00)
GFR, Estimated: >60 mL/min (ref >60)
Glucose, Bld: 101 mg/dL — ABNORMAL HIGH (ref 70–99)
Potassium: 3.1 mmol/L — ABNORMAL LOW (ref 3.5–5.1)
Sodium: 137 mmol/L (ref 135–145)
Total Bilirubin: 0.2 mg/dL — ABNORMAL LOW (ref 0.3–1.2)
Total Protein: 7.5 g/dL (ref 6.5–8.1)

## 2021-02-17 LAB — CBC WITH DIFFERENTIAL/PLATELET
Abs Immature Granulocytes: 0.02 10*3/uL (ref 0.00–0.07)
Basophils Absolute: 0.1 10*3/uL (ref 0.0–0.1)
Basophils Relative: 1 %
Eosinophils Absolute: 0.5 10*3/uL (ref 0.0–0.5)
Eosinophils Relative: 7 %
HCT: 40.1 % (ref 36.0–46.0)
Hemoglobin: 13.6 g/dL (ref 12.0–15.0)
Immature Granulocytes: 0 %
Lymphocytes Relative: 29 %
Lymphs Abs: 2 10*3/uL (ref 0.7–4.0)
MCH: 31.1 pg (ref 26.0–34.0)
MCHC: 33.9 g/dL (ref 30.0–36.0)
MCV: 91.8 fL (ref 80.0–100.0)
Monocytes Absolute: 0.7 10*3/uL (ref 0.1–1.0)
Monocytes Relative: 11 %
Neutro Abs: 3.6 10*3/uL (ref 1.7–7.7)
Neutrophils Relative %: 52 %
Platelets: 382 10*3/uL (ref 150–400)
RBC: 4.37 MIL/uL (ref 3.87–5.11)
RDW: 12.7 % (ref 11.5–15.5)
WBC: 6.9 10*3/uL (ref 4.0–10.5)
nRBC: 0 % (ref 0.0–0.2)

## 2021-02-17 LAB — URINALYSIS, ROUTINE W REFLEX MICROSCOPIC
Bilirubin Urine: NEGATIVE
Glucose, UA: NEGATIVE mg/dL
Hgb urine dipstick: NEGATIVE
Leukocytes,Ua: NEGATIVE
Nitrite: NEGATIVE
Specific Gravity, Urine: 1.025 (ref 1.005–1.030)
pH: 6.5 (ref 5.0–8.0)

## 2021-02-17 LAB — I-STAT BETA HCG BLOOD, ED (MC, WL, AP ONLY): I-stat hCG, quantitative: <5 m[IU]/mL (ref <5)

## 2021-02-17 LAB — RESP PANEL BY RT-PCR (FLU A&B, COVID) ARPGX2
Influenza A by PCR: NEGATIVE
Influenza B by PCR: NEGATIVE
SARS Coronavirus 2 by RT PCR: NEGATIVE

## 2021-02-17 LAB — VALPROIC ACID LEVEL: Valproic Acid Lvl: <10 ug/mL — ABNORMAL LOW (ref 50.0–100.0)

## 2021-02-17 LAB — ETHANOL: Alcohol, Ethyl (B): <10 mg/dL (ref <10)

## 2021-02-17 LAB — PREGNANCY, URINE: Preg Test, Ur: NEGATIVE

## 2021-02-17 MED ORDER — SODIUM CHLORIDE 0.9 % IV SOLN
2000.0000 mg | Freq: Once | INTRAVENOUS | Status: AC
  Administered 2021-02-17: 2000 mg via INTRAVENOUS
  Filled 2021-02-17 (×2): qty 20

## 2021-02-17 NOTE — ED Notes (Signed)
Patients sister would like an update at : Patricia Drake [phone]

## 2021-02-17 NOTE — BH Assessment (Signed)
Clinician messaged Westley Foots, RN: "Hey it's Thurston Pounds with TTS. Is the pt able to be seen, also is the pt under IVC?"   Clinician awaiting response.    Redmond Pulling, MS, [facility], [facility] Triage Specialist [phone]

## 2021-02-17 NOTE — ED Notes (Signed)
Pt was able to ambulate without any assistance to restroom and back to room.

## 2021-02-17 NOTE — ED Notes (Addendum)
Pt has a bag behind nurse station of room 17. The bag contains a dress, bra, panties, lighter, and sandals.

## 2021-02-17 NOTE — ED Notes (Signed)
TTS in process now.  

## 2021-02-17 NOTE — ED Notes (Addendum)
Pt attempted to get out of bed and use the phone in the room. [employer] intervened and would not allow her to use the phone d/t pt being in police custody at this time. Pt began yelling and verbally abusing [employer] and staff stating "nobody gives a fuck about me" "ya'll don't understand""I just need to call my fucking boss"
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
## 2021-02-17 NOTE — ED Notes (Signed)
Secretary stated patient's sister wanted an update. Patient is here for Behavioral health eval and is in police custody. Per Consulting civil engineer no information can be given out.

## 2021-02-17 NOTE — ED Triage Notes (Signed)
Pt BIB EMS. [place]. When pt found out she was going to jail, She had two witnessed seizures. Hx of seizures, takes Depakote and has not taken medication today. Also endorses SI and audible hallucinations.   BP 110/73 HR 82 RR 18 98% room CBG 100

## 2021-02-18 NOTE — ED Provider Notes (Signed)
The patient was told that she would be taken to jail and she immediately began throwing a "tantrum", yelling threats at her arresting officers.  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  She can be taken to jail and placed on suicide watch.     Patricia Drake, Jonny Ruiz, MD 02/18/21 [phone]
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
## 2021-02-18 NOTE — ED Notes (Signed)
Per Nira Conn, PHMNP recommends inpatient treatment. AC to review. If no beds are available at [facility] Disposition CSW to seek placement.

## 2021-03-15 ENCOUNTER — Emergency Department (HOSPITAL_COMMUNITY): Payer: 59

## 2021-03-15 ENCOUNTER — Encounter (HOSPITAL_COMMUNITY): Payer: Self-pay | Admitting: Emergency Medicine

## 2021-03-15 ENCOUNTER — Other Ambulatory Visit: Payer: Self-pay

## 2021-03-15 ENCOUNTER — Emergency Department (HOSPITAL_COMMUNITY)
Admission: EM | Admit: 2021-03-15 | Discharge: 2021-03-15 | Disposition: A | Discharge status: Home / Self Care | Payer: 59 | Attending: Emergency Medicine | Admitting: Emergency Medicine

## 2021-03-15 DIAGNOSIS — F17210 Nicotine dependence, cigarettes, uncomplicated: Secondary | ICD-10-CM | POA: Insufficient documentation

## 2021-03-15 DIAGNOSIS — M545 Low back pain, unspecified: Secondary | ICD-10-CM | POA: Diagnosis present

## 2021-03-15 DIAGNOSIS — M5441 Lumbago with sciatica, right side: Secondary | ICD-10-CM | POA: Insufficient documentation

## 2021-03-15 DIAGNOSIS — M62838 Other muscle spasm: Secondary | ICD-10-CM | POA: Insufficient documentation

## 2021-03-15 DIAGNOSIS — M5431 Sciatica, right side: Secondary | ICD-10-CM

## 2021-03-15 LAB — CBC WITH DIFFERENTIAL/PLATELET
Abs Immature Granulocytes: 0.05 10*3/uL (ref 0.00–0.07)
Basophils Absolute: 0.1 10*3/uL (ref 0.0–0.1)
Basophils Relative: 1 %
Eosinophils Absolute: 0.4 10*3/uL (ref 0.0–0.5)
Eosinophils Relative: 5 %
HCT: 47.9 % — ABNORMAL HIGH (ref 36.0–46.0)
Hemoglobin: 16.4 g/dL — ABNORMAL HIGH (ref 12.0–15.0)
Immature Granulocytes: 1 %
Lymphocytes Relative: 30 %
Lymphs Abs: 2.6 10*3/uL (ref 0.7–4.0)
MCH: 31.4 pg (ref 26.0–34.0)
MCHC: 34.2 g/dL (ref 30.0–36.0)
MCV: 91.6 fL (ref 80.0–100.0)
Monocytes Absolute: 0.6 10*3/uL (ref 0.1–1.0)
Monocytes Relative: 6 %
Neutro Abs: 4.9 10*3/uL (ref 1.7–7.7)
Neutrophils Relative %: 57 %
Platelets: UNDETERMINED 10*3/uL (ref 150–400)
RBC: 5.23 MIL/uL — ABNORMAL HIGH (ref 3.87–5.11)
RDW: 12.7 % (ref 11.5–15.5)
WBC: 8.5 10*3/uL (ref 4.0–10.5)
nRBC: 0 % (ref 0.0–0.2)

## 2021-03-15 LAB — HCG, SERUM, QUALITATIVE: Preg, Serum: NEGATIVE

## 2021-03-15 LAB — SEDIMENTATION RATE: Sed Rate: 15 mm/hr (ref 0–22)

## 2021-03-15 LAB — C-REACTIVE PROTEIN: CRP: 0.9 mg/dL (ref <1.0)

## 2021-03-15 MED ORDER — OXYCODONE HCL 5 MG PO TABS
5.0000 mg | ORAL_TABLET | Freq: Once | ORAL | Status: AC
  Administered 2021-03-15: 5 mg via ORAL
  Filled 2021-03-15: qty 1

## 2021-03-15 MED ORDER — DIAZEPAM 2 MG PO TABS
2.0000 mg | ORAL_TABLET | Freq: Once | ORAL | Status: AC
  Administered 2021-03-15: 2 mg via ORAL
  Filled 2021-03-15: qty 1

## 2021-03-15 MED ORDER — PREDNISONE 20 MG PO TABS: 40.0000 mg | ORAL_TABLET | Freq: Every day | ORAL | 0 refills | Status: AC

## 2021-03-15 MED ORDER — CYCLOBENZAPRINE HCL 10 MG PO TABS: 10.0000 mg | ORAL_TABLET | Freq: Two times a day (BID) | ORAL | 0 refills | Status: AC | PRN

## 2021-03-15 MED ORDER — ACETAMINOPHEN 500 MG PO TABS
1000.0000 mg | ORAL_TABLET | Freq: Once | ORAL | Status: AC
  Administered 2021-03-15: 1000 mg via ORAL
  Filled 2021-03-15: qty 2

## 2021-03-15 MED ORDER — KETOROLAC TROMETHAMINE 15 MG/ML IJ SOLN
15.0000 mg | Freq: Once | INTRAMUSCULAR | Status: AC
  Administered 2021-03-15: 15 mg via INTRAMUSCULAR
  Filled 2021-03-15: qty 1

## 2021-03-15 NOTE — ED Provider Notes (Signed)
Emergency Medicine Provider Triage Evaluation Note  Patricia Drake , a 35 y.o. female  was evaluated in triage.  Pt complains of right lower back pain over the last month.  He has been constant and worsening.  She states that she feels a "knot" in the area.  Pain radiates down her right leg with associated numbness.  She does endorse IV methamphetamine use.  Last used 2 days ago.  Denies any fever or chills.  Review of Systems  Positive:  Negative: See above   Physical Exam  There were no vitals taken for this visit. Gen:   Awake, no distress   Resp:  Normal effort  MSK:   Moves extremities without difficulty  Other:    Medical Decision Making  Medically screening exam initiated at 2:02 PM.  Appropriate orders placed.  Patricia Drake was informed that the remainder of the evaluation will be completed by another provider, this initial triage assessment does not replace that evaluation, and the importance of remaining in the ED until their evaluation is complete.     Honor Loh McGregor, PA-C 03/15/21 1403    Derwood Kaplan, MD 03/16/21 1349

## 2021-03-15 NOTE — ED Triage Notes (Signed)
Pt reports R lower back pain x 1 1/2 months.  States she feels a knot.  Pain radiates down R leg with numbness.
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
## 2021-03-15 NOTE — Discharge Instructions (Addendum)
Your MRI on today's visit was within normal limits.  Your laboratory results were also normal.  I have provided a prescription for steroids to help with your pain.  Please take 2 tablets daily for the next 5 days.  Please be aware this medication can cause flushness, attack changes, insomnia.  The second medication or muscle relaxers,

## 2021-03-22 ENCOUNTER — Encounter (HOSPITAL_COMMUNITY): Payer: Self-pay | Admitting: Emergency Medicine

## 2021-03-22 ENCOUNTER — Emergency Department (HOSPITAL_COMMUNITY)
Admission: EM | Admit: 2021-03-22 | Discharge: 2021-03-22 | Disposition: A | Discharge status: Home / Self Care | Payer: 59 | Attending: Emergency Medicine | Admitting: Emergency Medicine

## 2021-03-22 ENCOUNTER — Other Ambulatory Visit: Payer: Self-pay

## 2021-03-22 DIAGNOSIS — R059 Cough, unspecified: Secondary | ICD-10-CM | POA: Diagnosis not present

## 2021-03-22 DIAGNOSIS — R1110 Vomiting, unspecified: Secondary | ICD-10-CM | POA: Diagnosis not present

## 2021-03-22 DIAGNOSIS — R197 Diarrhea, unspecified: Secondary | ICD-10-CM | POA: Diagnosis not present

## 2021-03-22 DIAGNOSIS — Z5321 Procedure and treatment not carried out due to patient leaving prior to being seen by health care provider: Secondary | ICD-10-CM | POA: Diagnosis not present

## 2021-03-22 DIAGNOSIS — R300 Dysuria: Secondary | ICD-10-CM | POA: Diagnosis not present

## 2021-03-22 DIAGNOSIS — R6883 Chills (without fever): Secondary | ICD-10-CM | POA: Diagnosis not present

## 2021-03-22 NOTE — ED Notes (Signed)
Pt called to take vital signs with no response x1

## 2021-03-22 NOTE — ED Notes (Signed)
Pt called with no response x3 

## 2021-03-22 NOTE — ED Notes (Signed)
Pt called with no response x2
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
## 2021-07-15 ENCOUNTER — Encounter (HOSPITAL_COMMUNITY): Payer: Self-pay | Admitting: Emergency Medicine

## 2021-07-15 ENCOUNTER — Emergency Department (HOSPITAL_COMMUNITY)
Admission: EM | Admit: 2021-07-15 | Discharge: 2021-07-19 | Payer: Self-pay | Attending: Emergency Medicine | Admitting: Emergency Medicine

## 2021-07-15 DIAGNOSIS — Z20822 Contact with and (suspected) exposure to covid-19: Secondary | ICD-10-CM | POA: Insufficient documentation

## 2021-07-15 DIAGNOSIS — Z046 Encounter for general psychiatric examination, requested by authority: Secondary | ICD-10-CM | POA: Insufficient documentation

## 2021-07-15 DIAGNOSIS — D72829 Elevated white blood cell count, unspecified: Secondary | ICD-10-CM | POA: Insufficient documentation

## 2021-07-15 DIAGNOSIS — F339 Major depressive disorder, recurrent, unspecified: Secondary | ICD-10-CM | POA: Insufficient documentation

## 2021-07-15 DIAGNOSIS — T50902A Poisoning by unspecified drugs, medicaments and biological substances, intentional self-harm, initial encounter: Secondary | ICD-10-CM | POA: Insufficient documentation

## 2021-07-15 DIAGNOSIS — T1491XA Suicide attempt, initial encounter: Secondary | ICD-10-CM

## 2021-07-15 DIAGNOSIS — F1220 Cannabis dependence, uncomplicated: Secondary | ICD-10-CM | POA: Insufficient documentation

## 2021-07-15 MED ORDER — RISPERIDONE 1 MG PO TBDP
2.0000 mg | ORAL_TABLET | Freq: Three times a day (TID) | ORAL | Status: DC | PRN
  Filled 2021-07-15: qty 2

## 2021-07-15 MED ORDER — STERILE WATER FOR INJECTION IJ SOLN
INTRAMUSCULAR | Status: AC
  Administered 2021-07-15: 1 mL
  Filled 2021-07-15: qty 10

## 2021-07-15 MED ORDER — ACETAMINOPHEN 325 MG PO TABS: 650.0000 mg | ORAL_TABLET | ORAL | Status: DC | PRN

## 2021-07-15 MED ORDER — ZIPRASIDONE MESYLATE 20 MG IM SOLR
20.0000 mg | INTRAMUSCULAR | Status: AC | PRN
  Administered 2021-07-15: 20 mg via INTRAMUSCULAR
  Filled 2021-07-15: qty 20

## 2021-07-15 MED ORDER — ONDANSETRON HCL 4 MG PO TABS: 4.0000 mg | ORAL_TABLET | Freq: Three times a day (TID) | ORAL | Status: DC | PRN

## 2021-07-15 MED ORDER — NICOTINE 21 MG/24HR TD PT24
21.0000 mg | MEDICATED_PATCH | Freq: Every day | TRANSDERMAL | Status: DC
  Administered 2021-07-16: 21 mg via TRANSDERMAL
  Filled 2021-07-15 (×2): qty 1

## 2021-07-15 MED ORDER — ALUM & MAG HYDROXIDE-SIMETH 200-200-20 MG/5ML PO SUSP: 30.0000 mL | Freq: Four times a day (QID) | ORAL | Status: DC | PRN

## 2021-07-15 MED ORDER — LORAZEPAM 1 MG PO TABS: 1.0000 mg | ORAL_TABLET | ORAL | Status: DC | PRN

## 2021-07-15 MED ORDER — ZOLPIDEM TARTRATE 5 MG PO TABS: 5.0000 mg | ORAL_TABLET | Freq: Every evening | ORAL | Status: DC | PRN

## 2021-07-15 NOTE — ED Provider Notes (Signed)
[facility] EMERGENCY DEPARTMENT Provider Note   CSN: [id number] Arrival date & time: 07/15/21  2218     History  Chief Complaint  Patient presents with   Drug Overdose   Suicide Attempt    Patricia Drake is a 36 y.o. female.  The history is provided by the EMS personnel, the police and medical records. No language interpreter was used.  Drug Overdose   This is a 36 year old female significant history of seizures, depression, PTSD, prior intentional overdose, brought here via EMS accompanied by [employer] for suicide attempt.  History was obtained through [employer] at bedside.  They receive a call from patient's friend for a well check earlier tonight.  Patient apparently has been sending text message to a friend stating that she is planning to kill herself by negative taken off her medications.  When they arrived, they saw several empty bottles of medication next to her and patient was having seizure-like activities.  EMS was called and patient was noted to be agitated on arrival.  The remainder of history was limited as I am unable to obtain much history from patient.  Antibiotics include prednisone, cyclobenzaprine, buspirone, and mirtazapine.   Home Medications Prior to Admission medications   Medication Sig Start Date End Date Taking? Authorizing Provider  albuterol (VENTOLIN HFA) 108 (90 Base) MCG/ACT inhaler Inhale 2 puffs into the lungs 4 (four) times daily as needed for wheezing or shortness of breath. 01/28/21   [provider]  amoxicillin (AMOXIL) 500 MG capsule Take 500 mg by mouth 3 (three) times daily. 7 day supply 01/28/21   [provider]  busPIRone (BUSPAR) 15 MG tablet Take 15 mg by mouth 3 (three) times daily. 01/28/21   [provider]  divalproex (DEPAKOTE) 500 MG DR tablet Take 500 mg by mouth 2 (two) times daily.    [provider]  fluticasone-salmeterol (ADVAIR) 100-50 MCG/ACT AEPB Inhale 1 puff into the lungs 2 (two)  times daily.    [provider]  ibuprofen (ADVIL) 800 MG tablet Take 800 mg by mouth 3 (three) times daily as needed for headache (pain). 11/28/20   [provider]  levETIRAcetam (KEPPRA) 500 MG tablet Take 1 tablet (500 mg total) by mouth 2 (two) times daily. 01/26/21 02/25/21  Terald Sleeper, MD  naproxen (NAPROSYN) 375 MG tablet Take 1 tablet (375 mg total) by mouth 2 (two) times daily. Patient not taking: No sig reported 10/05/20   Pricilla Loveless, MD  norgestimate-ethinyl estradiol (ORTHO-CYCLEN) 0.25-35 MG-MCG tablet Take 1 tablet by mouth daily.    [provider]  omeprazole (PRILOSEC) 20 MG capsule Take 20 mg by mouth daily. 01/28/21   [provider]  traZODone (DESYREL) 50 MG tablet Take 50 mg by mouth at bedtime. 01/28/21   [provider]  phenytoin (DILANTIN) 100 MG ER capsule Take 1 capsule (100 mg total) by mouth 3 (three) times daily. Patient not taking: Reported on 09/27/2019 01/01/19 09/28/19  Dione Booze, MD      Allergies    Sulfa antibiotics    Review of Systems   Review of Systems  Unable to perform ROS: Mental status change   Physical Exam Updated Vital Signs BP 108/69    Pulse 90    Temp (!) 97 F (36.1 C) (Temporal)    Resp 18    SpO2 100%  Physical Exam Vitals and nursing note reviewed.  Constitutional:      General: She is not in acute distress.  Appearance: She is well-developed. She is obese.     Comments: Obese female, sleeping, arousable to painful stimuli and appears to be in no acute distress.  HENT:     Head: Atraumatic.  Eyes:     Conjunctiva/sclera: Conjunctivae normal.     Comments: Pupils are 2 mm bilaterally and reactive  Cardiovascular:     Rate and Rhythm: Normal rate.     Pulses: Normal pulses.     Heart sounds: Normal heart sounds.  Pulmonary:     Effort: Pulmonary effort is normal.     Breath sounds: No wheezing, rhonchi or rales.  Abdominal:     Palpations: Abdomen is soft.   Musculoskeletal:     Cervical back: Normal range of motion and neck supple. No rigidity.     Comments: Moving all 4 extremities with painful stimuli  Skin:    Findings: No rash.  Neurological:     Mental Status: She is lethargic.     GCS: GCS eye subscore is 2. GCS verbal subscore is 4. GCS motor subscore is 5.  Psychiatric:        Mood and Affect: Mood normal.        Thought Content: Thought content includes suicidal ideation.    ED Results / Procedures / Treatments   Labs (all labs ordered are listed, but only abnormal results are displayed) Labs Reviewed  CBC WITH DIFFERENTIAL/PLATELET - Abnormal; Notable for the following components:      Result Value   WBC 12.8 (*)    Hemoglobin 15.5 (*)    HCT 47.8 (*)    Platelets 496 (*)    Neutro Abs 8.0 (*)    All other components within normal limits  COMPREHENSIVE METABOLIC PANEL - Abnormal; Notable for the following components:   CO2 20 (*)    Glucose, Bld 113 (*)    Total Protein 8.6 (*)    Anion gap 16 (*)    All other components within normal limits  SALICYLATE LEVEL - Abnormal; Notable for the following components:   Salicylate Lvl <7.0 (*)    All other components within normal limits  ACETAMINOPHEN LEVEL - Abnormal; Notable for the following components:   Acetaminophen (Tylenol), Serum <10 (*)    All other components within normal limits  RESP PANEL BY RT-PCR (FLU A&B, COVID) ARPGX2  ETHANOL  MAGNESIUM  RAPID URINE DRUG SCREEN, HOSP PERFORMED  ACETAMINOPHEN LEVEL  ACETAMINOPHEN LEVEL  ACETAMINOPHEN LEVEL  ACETAMINOPHEN LEVEL  ACETAMINOPHEN LEVEL  ACETAMINOPHEN LEVEL  I-STAT BETA HCG BLOOD, ED ([facility], [facility], [facility] ONLY)    EKG None ED ECG REPORT   Date: 07/16/2021  Rate: 109  Rhythm: sinus tachycardia  QRS Axis: normal  Intervals:  borderline prolonged QT  ST/T Wave abnormalities: normal  Conduction Disutrbances:none  Narrative Interpretation:   Old EKG Reviewed: unchanged  I have personally reviewed the EKG  tracing and agree with the computerized printout as noted.   Radiology No results found.  Procedures Procedures    Medications Ordered in ED Medications  risperiDONE (RISPERDAL M-TABS) disintegrating tablet 2 mg (has no administration in time range)    And  LORazepam (ATIVAN) tablet 1 mg (has no administration in time range)    And  ziprasidone (GEODON) injection 20 mg (has no administration in time range)  acetaminophen (TYLENOL) tablet 650 mg (has no administration in time range)  zolpidem (AMBIEN) tablet 5 mg (has no administration in time range)  ondansetron (ZOFRAN) tablet 4 mg (has no administration in time  range)  alum & mag hydroxide-simeth (MAALOX/MYLANTA) 200-200-20 MG/5ML suspension 30 mL (has no administration in time range)  nicotine (NICODERM CQ - dosed in mg/24 hours) patch 21 mg (has no administration in time range)    ED Course/ Medical Decision Making/ A&P                           Medical Decision Making Problems Addressed: Intentional drug overdose, initial encounter [facility](HCC): acute illness or injury    Details: -labs obtained, pt monitored and is medically cleared -psych to evaluate Suicide attempt [facility](HCC): acute illness or injury that poses a threat to life or bodily functions    Details: -intentional drug overdose -needs to be manage further by psych -IVC paper filed  Amount and/or Complexity of Data Reviewed Independent Historian: EMS External Data Reviewed: notes. Labs: ordered. Decision-making details documented in ED Course. ECG/medicine tests: ordered and independent interpretation performed. Decision-making details documented in ED Course.  Risk OTC drugs. Prescription drug management. Drug therapy requiring intensive monitoring for toxicity. Decision regarding hospitalization.   BP 108/69    Pulse 90    Temp (!) 97 F (36.1 C) (Temporal)    Resp 18    SpO2 100%   10:47 PM This is a 36 year old female significant history of depression,  prior suicide attempt, brought here by EMS accompanied by [employer] due to concerns of intentional drug overdose.  Patient did send message to her friend tonight stating that she plan on killing herself by taking off her medication.  There was several empty bottles that were found next to patient at her place when [employer] came in for a wellness check.  The patient apparently was agitated and combative on arrival.  At this time she is sleeping, responds to painful stimuli and does not appear to be in any acute discomfort.  We have contacted [facility] who recommend q4-6 hr EKGs, q4 tylenol level, add a mag level, and if she seizes give benzos first, then barbiturates, watch for vtach/vfib.  Observation for 6-8 hrs.   I have also initiated IVC paper and first exam.  Care discussed with Dr. Silverio LayYao.   I have reviewed patient's prior notes and noted the patient has been evaluated multiple times for seizure-like disorder.   2:34 AM Labs was independently reviewed interpreted by me.  Pregnancy test is negative, white count is elevated at 12.8 likely stress-induced, electrolyte panels are reassuring, alcohol level undetectable, aspirin level and Tylenol level undetectable, magnesium level is normal, screening viral respiratory panel was negative  5:37 AM At this time pt is medically cleared and can be assess further by TTS and psych.          Final Clinical Impression(s) / ED Diagnoses Final diagnoses:  Suicide attempt [facility](HCC)  Intentional drug overdose, initial encounter [facility](HCC)    Rx / DC Orders ED Discharge Orders     None         Fayrene Helperran, Oryon Gary, PA-C 07/16/21 0541    Charlynne PanderYao, David Hsienta, MD 07/16/21 [phone]

## 2021-07-15 NOTE — ED Triage Notes (Signed)
Pt BIB [facility] for suicide attempt. EMS found empty bottles of prednisone, cyclobenzaprine, buspirone, and mirtazapine. Pt's friend called [employer] after pt sent her a text that she was going to take all her pills. Agitated on arrival, alert to voice.

## 2021-07-15 NOTE — ED Notes (Signed)
Pt became increasingly agitated, yelling at staff, not allowing for blood draw. Security and [employer] at bedside.

## 2021-07-15 NOTE — ED Notes (Signed)
Unable to obtain blood pressure, pt agitated and continuously ripping of blood pressure cuff.

## 2021-07-16 LAB — CBC WITH DIFFERENTIAL/PLATELET
Abs Immature Granulocytes: 0.05 10*3/uL (ref 0.00–0.07)
Basophils Absolute: 0.1 10*3/uL (ref 0.0–0.1)
Basophils Relative: 1 %
Eosinophils Absolute: 0.2 10*3/uL (ref 0.0–0.5)
Eosinophils Relative: 2 %
HCT: 47.8 % — ABNORMAL HIGH (ref 36.0–46.0)
Hemoglobin: 15.5 g/dL — ABNORMAL HIGH (ref 12.0–15.0)
Immature Granulocytes: 0 %
Lymphocytes Relative: 30 %
Lymphs Abs: 3.9 10*3/uL (ref 0.7–4.0)
MCH: 30.8 pg (ref 26.0–34.0)
MCHC: 32.4 g/dL (ref 30.0–36.0)
MCV: 95 fL (ref 80.0–100.0)
Monocytes Absolute: 0.5 10*3/uL (ref 0.1–1.0)
Monocytes Relative: 4 %
Neutro Abs: 8 10*3/uL — ABNORMAL HIGH (ref 1.7–7.7)
Neutrophils Relative %: 63 %
Platelets: 496 10*3/uL — ABNORMAL HIGH (ref 150–400)
RBC: 5.03 MIL/uL (ref 3.87–5.11)
RDW: 12.8 % (ref 11.5–15.5)
WBC: 12.8 10*3/uL — ABNORMAL HIGH (ref 4.0–10.5)
nRBC: 0 % (ref 0.0–0.2)

## 2021-07-16 LAB — I-STAT BETA HCG BLOOD, ED (MC, WL, AP ONLY): I-stat hCG, quantitative: <5 m[IU]/mL (ref <5)

## 2021-07-16 LAB — MAGNESIUM: Magnesium: 2.3 mg/dL (ref 1.7–2.4)

## 2021-07-16 LAB — COMPREHENSIVE METABOLIC PANEL
ALT: 17 U/L (ref 0–44)
AST: 21 U/L (ref 15–41)
Albumin: 4.1 g/dL (ref 3.5–5.0)
Alkaline Phosphatase: 86 U/L (ref 38–126)
Anion gap: 16 — ABNORMAL HIGH (ref 5–15)
BUN: 10 mg/dL (ref 6–20)
CO2: 20 mmol/L — ABNORMAL LOW (ref 22–32)
Calcium: 9.6 mg/dL (ref 8.9–10.3)
Chloride: 108 mmol/L (ref 98–111)
Creatinine, Ser: 0.91 mg/dL (ref 0.44–1.00)
GFR, Estimated: >60 mL/min (ref >60)
Glucose, Bld: 113 mg/dL — ABNORMAL HIGH (ref 70–99)
Potassium: 4.5 mmol/L (ref 3.5–5.1)
Sodium: 144 mmol/L (ref 135–145)
Total Bilirubin: 0.3 mg/dL (ref 0.3–1.2)
Total Protein: 8.6 g/dL — ABNORMAL HIGH (ref 6.5–8.1)

## 2021-07-16 LAB — ACETAMINOPHEN LEVEL
Acetaminophen (Tylenol), Serum: <10 ug/mL — ABNORMAL LOW (ref 10–30)
Acetaminophen (Tylenol), Serum: <10 ug/mL — ABNORMAL LOW (ref 10–30)
Acetaminophen (Tylenol), Serum: <10 ug/mL — ABNORMAL LOW (ref 10–30)
Acetaminophen (Tylenol), Serum: <10 ug/mL — ABNORMAL LOW (ref 10–30)

## 2021-07-16 LAB — RESP PANEL BY RT-PCR (FLU A&B, COVID) ARPGX2
Influenza A by PCR: NEGATIVE
Influenza B by PCR: NEGATIVE
SARS Coronavirus 2 by RT PCR: NEGATIVE

## 2021-07-16 LAB — SALICYLATE LEVEL: Salicylate Lvl: <7 mg/dL — ABNORMAL LOW (ref 7.0–30.0)

## 2021-07-16 LAB — ETHANOL: Alcohol, Ethyl (B): <10 mg/dL (ref <10)

## 2021-07-16 MED ORDER — LORAZEPAM 2 MG/ML IJ SOLN
2.0000 mg | Freq: Once | INTRAMUSCULAR | Status: AC
  Administered 2021-07-16: 2 mg via INTRAVENOUS
  Filled 2021-07-16: qty 1

## 2021-07-16 MED ORDER — STERILE WATER FOR INJECTION IJ SOLN
INTRAMUSCULAR | Status: AC
  Filled 2021-07-16: qty 10

## 2021-07-16 MED ORDER — ZIPRASIDONE MESYLATE 20 MG IM SOLR: 20.0000 mg | Freq: Once | INTRAMUSCULAR | Status: AC

## 2021-07-16 MED ORDER — ZIPRASIDONE MESYLATE 20 MG IM SOLR
20.0000 mg | Freq: Once | INTRAMUSCULAR | Status: AC
  Administered 2021-07-16: 20 mg via INTRAMUSCULAR
  Filled 2021-07-16: qty 20

## 2021-07-16 MED ORDER — ZIPRASIDONE MESYLATE 20 MG IM SOLR
INTRAMUSCULAR | Status: AC
  Administered 2021-07-16: 20 mg via INTRAMUSCULAR
  Filled 2021-07-16: qty 20

## 2021-07-16 MED ORDER — LEVETIRACETAM 500 MG PO TABS
500.0000 mg | ORAL_TABLET | Freq: Two times a day (BID) | ORAL | Status: DC
  Administered 2021-07-16 – 2021-07-19 (×7): 500 mg via ORAL
  Filled 2021-07-16 (×7): qty 1

## 2021-07-16 MED ORDER — DIVALPROEX SODIUM 500 MG PO DR TAB
500.0000 mg | DELAYED_RELEASE_TABLET | Freq: Two times a day (BID) | ORAL | Status: DC
  Administered 2021-07-16 – 2021-07-19 (×7): 500 mg via ORAL
  Filled 2021-07-16: qty 1
  Filled 2021-07-16 (×2): qty 2
  Filled 2021-07-16: qty 1
  Filled 2021-07-16: qty 2
  Filled 2021-07-16 (×2): qty 1

## 2021-07-16 MED ORDER — ALBUTEROL SULFATE HFA 108 (90 BASE) MCG/ACT IN AERS: 2.0000 | INHALATION_SPRAY | Freq: Four times a day (QID) | RESPIRATORY_TRACT | Status: DC | PRN

## 2021-07-16 MED ORDER — PRAZOSIN HCL 1 MG PO CAPS
1.0000 mg | ORAL_CAPSULE | Freq: Every day | ORAL | Status: DC
  Administered 2021-07-18: 1 mg via ORAL
  Filled 2021-07-16 (×4): qty 1
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
## 2021-07-16 NOTE — ED Notes (Signed)
The pt is asleep since I arrived at 1500

## 2021-07-16 NOTE — ED Notes (Signed)
The pt had 3 meds counted and given to pharmacy amoxicillin 500mg  trazadone 100mg    and divayprodex 250mg  240mg        4 other emptu bottles placed in her purple locker

## 2021-07-16 NOTE — BH Assessment (Signed)
TTS attempted to assess patient, was informed to call back. RN Luanna Salk, is currently taken care urgent matter. Will call back later.

## 2021-07-16 NOTE — ED Notes (Signed)
Spoke with Denny Peon, RN at poison control who recommended q4hr tylenol levels, a mag level, q4-6 EKGs and 6-8hour obs on pt.

## 2021-07-16 NOTE — ED Notes (Signed)
Patient was able to walk to bathroom x2 .patient is scrubbs  and resting at this time

## 2021-07-16 NOTE — ED Notes (Signed)
Some house mate maled called in demanding to talk with this pt.  This pt has been medicated and has only to eat  she has eaten then went right back to sleep  he wants someone to hand his number to her in the am som he can talk to her  he is also upset about the pt still being in the hallway  spmeone told him that when he called earl;ier.  Very unhappy

## 2021-07-16 NOTE — ED Notes (Signed)
Pt removed all tele-monitoring, out of bed, peeing in the trashcan, pt then walking around room trying to unplug vent set-up, easily agitated, redirected back to bed.

## 2021-07-16 NOTE — ED Notes (Signed)
The pt continues to sleep  she appears very tired

## 2021-07-16 NOTE — ED Notes (Signed)
The pt alert co-operative  lucid at present  she ate some of her food  no distress

## 2021-07-17 ENCOUNTER — Other Ambulatory Visit: Payer: Self-pay

## 2021-07-17 LAB — RAPID URINE DRUG SCREEN, HOSP PERFORMED
Amphetamines: POSITIVE — AB
Barbiturates: NOT DETECTED
Benzodiazepines: NOT DETECTED
Cocaine: NOT DETECTED
Opiates: NOT DETECTED
Tetrahydrocannabinol: POSITIVE — AB

## 2021-07-17 LAB — POC SARS CORONAVIRUS 2 AG -  ED: SARSCOV2ONAVIRUS 2 AG: NEGATIVE

## 2021-07-17 MED ORDER — ZIPRASIDONE MESYLATE 20 MG IM SOLR
20.0000 mg | Freq: Once | INTRAMUSCULAR | Status: AC
Start: 2021-07-17 — End: 2021-07-17
  Administered 2021-07-17: 20 mg via INTRAMUSCULAR
  Filled 2021-07-17: qty 20

## 2021-07-17 MED ORDER — STERILE WATER FOR INJECTION IJ SOLN
INTRAMUSCULAR | Status: AC
  Filled 2021-07-17: qty 10

## 2021-07-17 NOTE — Progress Notes (Signed)
Identified barrier pt has no payor source at this time and there are no open charity beds within [place] area at this time. CSW will follow to assist with inpatient behavioral health placement.   Benjaman Kindler, MSW, LCSWA 07/17/2021 11:18 PM

## 2021-07-17 NOTE — ED Provider Notes (Addendum)
Emergency Medicine Observation Re-evaluation Note  Patricia Drake is a 36 y.o. female, seen on rounds today.  Pt initially presented to the ED for complaints of Drug Overdose and Suicide Attempt Currently, the patient is sleeping.  Physical Exam  BP 108/62 (BP Location: Left Arm)    Pulse 95    Temp (!) 97 F (36.1 C) (Temporal)    Resp 18    SpO2 93%  Physical Exam General: No acute distress Cardiac: Well-perfused Lungs: Nonlabored Psych: Cooperative  ED Course / MDM  EKG:EKG Interpretation  Date/Time:  Tuesday July 15 2021 22:25:57 EST Ventricular Rate:  91 PR Interval:  127 QRS Duration: 103 QT Interval:  398 QTC Calculation: 490 R Axis:   5 Text Interpretation: Sinus rhythm Low voltage, precordial leads Borderline prolonged QT interval Confirmed by Cathren Laine ([id number]) on 07/16/2021 5:26:37 PM  I have reviewed the labs performed to date as well as medications administered while in observation.  Recent changes in the last 24 hours include evaluation by TTS.  Plan  Current plan is for inpatient placement. Patricia Drake is under involuntary commitment.      Terrilee Files, MD 07/17/21 [phone] 12 PM.  Patient becoming very loud and agitated.  Unable to redirect.  [place] ordered.   Terrilee Files, MD 07/17/21 2022

## 2021-07-17 NOTE — Progress Notes (Signed)
Patient has been denied by [facility] due to no appropriate beds available. Patient meets [facility] inpatient per Ophelia Shoulder, NP. Patient has been faxed out to the following facilities:   [facility]  [address] [place] [address] [phone]  [facility] [address]  [address] [place] [address] [phone]  [facility]  [address] [place], [place] [address] [phone]  [facility]  [address] [place] [address] [phone]  [facility]  [address] [place] [address] [phone]  [facility]  [phone] [address] [place] [address] [phone]  [facility]  [address] [place] [address] [phone]  [facility]  [address], [place] [address] [phone]  [facility]  [address], [place] [address] [phone]  [facility]  [address] [place] [address] [phone]  [facility]  [address], [place] [address] [phone]  [facility]  [address], [place] [address] [phone]  [facility]  [address] [place] [address] [phone]  [facility]  [address] [place], [place] [address] [phone]  [facility]  [address] [place] [address] [phone]   Damita Dunnings, MSW, LCSW-A  3:49 PM 07/17/2021

## 2021-07-17 NOTE — ED Notes (Signed)
[name] [phone] requesting to speak to the patient

## 2021-07-17 NOTE — Progress Notes (Signed)
Inpatient Behavioral Health Placement  Pt meets inpatient criteria per Merlyn Lot, NP CSW sent a secure chat requesting that day shift [facility] AC review. Referral was sent to the following facilities;   Destination Service Provider Address Phone Fax  [address] [place] [address] [phone]  [place], [place] [phone]  [facility]  [place], [place] [address] [place]  [facility]  [address] [place] [address] [phone]  [facility]  [place], [place] [address] [phone] [facility]  [id number] [place] [address] [phone] [facility]  [address] [place] [address] [phone]  [place] [address] [phone] [place]  [address], [place] [address] [phone] [facility]  [address] [place] [address] [phone]  [facility]  [address] [place] [address] [phone] [facility]  [address], [place] [address] [phone]  [facility]  [place], [place] [address] [phone]  [facility]  [address] [place], [place] [address] [place]  [facility]  [address] [place] [address] [phone]    Situation ongoing,  CSW will follow up.   Benjaman Kindler, MSW, LCSWA 07/17/2021  @ 12:51 AM
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
## 2021-07-18 NOTE — ED Notes (Signed)
Patient made her first phone call of the day to call Manuela Schwartz. She became tearful and agitated, telling Manuela Schwartz that staff is "treating her like shit here" and "not telling her shit." The patient was instructed to lower her voice as to not disturb the other patients. The patient continued to yell during the phone call and started to exceed the 5 minute limit. The patient was instructed to end the phone call because she exceeded the time limit. The patient started screaming at staff and handed the phone to this RN to speak to Manuela Schwartz. The patient returned to her room to continue shouting and crying with sitter while this RN spoke to [place]. Manuela Schwartz discussed that the patient would benefit from speaking with her boss later this evening since they have a relationship that would calm her down. Phone number written down and provided to patient. Patient informed of plan to speak to boss later this evening since she has one more phone call. Patient informed about plan of care. Patient able to calm down and agreed to shower shortly.

## 2021-07-18 NOTE — ED Provider Notes (Signed)
Emergency Medicine Observation Re-evaluation Note  Patricia Drake is a 36 y.o. female, seen on rounds today.  Pt initially presented to the ED for complaints of Drug Overdose and Suicide Attempt Currently, the patient is sleeping.  Physical Exam  BP 98/68 (BP Location: Left Arm)    Pulse 78    Temp 98.4 F (36.9 C) (Oral)    Resp 18    SpO2 100%  Physical Exam General: No distress Cardiac: Regular rate and rhythm Lungs: No increased work of breathing Psych: Calm when awake  ED Course / MDM  EKG:EKG Interpretation  Date/Time:  Wednesday July 16 2021 13:03:03 EST Ventricular Rate:  90 PR Interval:  123 QRS Duration: 91 QT Interval:  396 QTC Calculation: 485 R Axis:   61 Text Interpretation: Sinus rhythm Low voltage, precordial leads Borderline prolonged QT interval Confirmed by Alona Bene [phone]) on 07/17/2021 3:48:39 PM  I have reviewed the labs performed to date as well as medications administered while in observation.  Recent changes in the last 24 hours include none.  Plan  Current plan is for placement. Patricia Drake is under involuntary commitment.      Gerhard Munch, MD 07/18/21 [phone]

## 2021-07-18 NOTE — ED Notes (Signed)
Patient reports IV site pain. This RN removed IV catheter at this time since the patient is medically clear and there is no indication for an IV at this time.

## 2021-07-18 NOTE — ED Notes (Signed)
Pt ambulated to restroom without any difficulties.  

## 2021-07-18 NOTE — Progress Notes (Signed)
Patient has been denied by [facility] due to no appropriate beds available. Patient meets [facility] inpatient criteria per Merlyn Lot, NP. Patient has been faxed out to the following facilities:    [employer] [place] [address] [phone] [place], [place] [phone] [place]  [facility]  [place], [place] [address] [place]  [facility]  [address] [place] [address] [phone] [place]  [place], [place] [address] [phone] [facility]  [phone] [address] [place] [address] [phone] [facility]  [place] [place] [address] [phone]  [place]  [place] [address] [phone] [place]  [address], [place] [address] [phone] [facility]  [address], [place] [address] [phone]  [facility]  [address], [place] [address] [phone] [facility]  [address], [place] [address] [phone]  [facility]  [place], [place] [address] [phone]  [facility]  [address] [place], [place] [address] [place]  [facility]  [address], [place]  [address] [phone] [place], MSW, LCSW-A  3:44 PM 07/18/2021

## 2021-07-18 NOTE — ED Notes (Signed)
Pt is currently sleeping at this time. No distress noted.

## 2021-07-18 NOTE — ED Notes (Signed)
Patient showering at this time. 

## 2021-07-18 NOTE — ED Notes (Signed)
Patient resting at this time. Patient asked what the plan is. This RN informed the patient that we are waiting to hear back from psychiatry on plan for placement and that someone named Herbie Baltimore will be visiting at some point today per comment in chart. Patient did not reply. Patient informed of medications due at this time. Patient asked for sprite. Patient provided with sprite and given medications. Patient refusing nicotine patch at this time.

## 2021-07-19 ENCOUNTER — Inpatient Hospital Stay (HOSPITAL_COMMUNITY)
Admission: AD | Admit: 2021-07-19 | Discharge: 2021-07-20 | DRG: 897 | Disposition: A | Discharge status: Other Acute Inpatient Hospital | Payer: Federal, State, Local not specified - Other | Source: Intra-hospital | Attending: Emergency Medicine | Admitting: Emergency Medicine

## 2021-07-19 ENCOUNTER — Encounter (HOSPITAL_COMMUNITY): Payer: Self-pay | Admitting: Emergency Medicine

## 2021-07-19 ENCOUNTER — Other Ambulatory Visit: Payer: Self-pay

## 2021-07-19 DIAGNOSIS — Z9151 Personal history of suicidal behavior: Secondary | ICD-10-CM

## 2021-07-19 DIAGNOSIS — E669 Obesity, unspecified: Secondary | ICD-10-CM | POA: Diagnosis present

## 2021-07-19 DIAGNOSIS — R569 Unspecified convulsions: Secondary | ICD-10-CM | POA: Diagnosis present

## 2021-07-19 DIAGNOSIS — F1590 Other stimulant use, unspecified, uncomplicated: Secondary | ICD-10-CM | POA: Diagnosis present

## 2021-07-19 DIAGNOSIS — F32A Depression, unspecified: Secondary | ICD-10-CM | POA: Diagnosis present

## 2021-07-19 DIAGNOSIS — F1994 Other psychoactive substance use, unspecified with psychoactive substance-induced mood disorder: Secondary | ICD-10-CM | POA: Diagnosis present

## 2021-07-19 DIAGNOSIS — Z882 Allergy status to sulfonamides status: Secondary | ICD-10-CM

## 2021-07-19 DIAGNOSIS — Z6841 Body Mass Index (BMI) 40.0 and over, adult: Secondary | ICD-10-CM | POA: Diagnosis not present

## 2021-07-19 DIAGNOSIS — F1290 Cannabis use, unspecified, uncomplicated: Secondary | ICD-10-CM | POA: Diagnosis present

## 2021-07-19 DIAGNOSIS — Z79899 Other long term (current) drug therapy: Secondary | ICD-10-CM | POA: Diagnosis not present

## 2021-07-19 DIAGNOSIS — R45851 Suicidal ideations: Secondary | ICD-10-CM | POA: Diagnosis present

## 2021-07-19 LAB — RESP PANEL BY RT-PCR (FLU A&B, COVID) ARPGX2
Influenza A by PCR: NEGATIVE
Influenza B by PCR: NEGATIVE
SARS Coronavirus 2 by RT PCR: NEGATIVE

## 2021-07-19 MED ORDER — ACETAMINOPHEN 325 MG PO TABS: 650.0000 mg | ORAL_TABLET | ORAL | Status: DC | PRN

## 2021-07-19 MED ORDER — PRAZOSIN HCL 1 MG PO CAPS
1.0000 mg | ORAL_CAPSULE | Freq: Every day | ORAL | Status: DC
  Administered 2021-07-19: 1 mg via ORAL
  Filled 2021-07-19 (×4): qty 1

## 2021-07-19 MED ORDER — OLANZAPINE 5 MG PO TBDP: 5.0000 mg | ORAL_TABLET | Freq: Three times a day (TID) | ORAL | Status: DC | PRN

## 2021-07-19 MED ORDER — LEVETIRACETAM 500 MG PO TABS
500.0000 mg | ORAL_TABLET | Freq: Two times a day (BID) | ORAL | Status: DC
  Administered 2021-07-19 – 2021-07-20 (×2): 500 mg via ORAL
  Filled 2021-07-19 (×7): qty 1

## 2021-07-19 MED ORDER — DIVALPROEX SODIUM 500 MG PO DR TAB
500.0000 mg | DELAYED_RELEASE_TABLET | Freq: Two times a day (BID) | ORAL | Status: DC
Start: 2021-07-19 — End: 2021-07-20
  Administered 2021-07-19 – 2021-07-20 (×2): 500 mg via ORAL
  Filled 2021-07-19 (×7): qty 1

## 2021-07-19 MED ORDER — ALBUTEROL SULFATE HFA 108 (90 BASE) MCG/ACT IN AERS: 2.0000 | INHALATION_SPRAY | Freq: Four times a day (QID) | RESPIRATORY_TRACT | Status: DC | PRN

## 2021-07-19 MED ORDER — NICOTINE 21 MG/24HR TD PT24
21.0000 mg | MEDICATED_PATCH | Freq: Every day | TRANSDERMAL | Status: DC
  Filled 2021-07-19 (×3): qty 1

## 2021-07-19 MED ORDER — ONDANSETRON HCL 4 MG PO TABS: 4.0000 mg | ORAL_TABLET | Freq: Three times a day (TID) | ORAL | Status: DC | PRN

## 2021-07-19 MED ORDER — ALUM & MAG HYDROXIDE-SIMETH 200-200-20 MG/5ML PO SUSP: 30.0000 mL | Freq: Four times a day (QID) | ORAL | Status: DC | PRN

## 2021-07-19 MED ORDER — LORAZEPAM 1 MG PO TABS: 1.0000 mg | ORAL_TABLET | ORAL | Status: DC | PRN

## 2021-07-19 MED ORDER — ZIPRASIDONE MESYLATE 20 MG IM SOLR: 20.0000 mg | INTRAMUSCULAR | Status: DC | PRN

## 2021-07-19 MED ORDER — RISPERIDONE 2 MG PO TBDP: 2.0000 mg | ORAL_TABLET | Freq: Three times a day (TID) | ORAL | Status: DC | PRN

## 2021-07-19 NOTE — ED Notes (Signed)
[facility] arrived to take patient to [facility]. She is asking about a laptop computer and "a lot" of money on me. Patient was brought in by EMS for drug overdose and described as lethargic.

## 2021-07-19 NOTE — ED Notes (Signed)
MD notified of need for new COVID PCR swab, bed available at [facility].

## 2021-07-19 NOTE — Progress Notes (Signed)
Patient is a 36 year old female who presented under IVC from [facility] ED for an intentional overdose of her home medications. Per report, pt was found by [employer] lethargic with empty pill bottles beside her. Pt has a hx of depression , cannabis use disorder, and pseudoseizures. Initially upon admission, pt presented as tearful, loud, and agitated voicing concern about not having her cell phone and debit card. Pt stated that she lives in a boarding house with "crack heads", and was afraid of her room mates stealing her money. Pt kept crying, "I just want to go home. I have a life!" Pt denied that she was suicidal, stating, "If I wanted to commit suicide I would have used heroin." Pt also voiced concern about her job, reporting that she worked at [employer]. Pt calmed down after finding her list of phone numbers, and completed admission paperwork.  VS monitored and recorded. Skin check revealed multiple bruising on bilateral legs and left arm. Belongings secured in locker.  Patient was oriented to unit and schedule. Pt denied SI/HI/AVH at this time. Q 15 min checks initiated for safety. Marland Kitchen

## 2021-07-19 NOTE — ED Notes (Signed)
Pt ambulated to BR with steady gait.

## 2021-07-19 NOTE — ED Notes (Signed)
[facility] to request transport to [facility].

## 2021-07-19 NOTE — ED Notes (Signed)
Breakfast tray delivered to pt at this time.  

## 2021-07-19 NOTE — ED Notes (Addendum)
Report called to Mateo Flow, RN at [facility]

## 2021-07-19 NOTE — ED Provider Notes (Addendum)
Emergency Medicine Observation Re-evaluation Note  Patricia Drake is a 36 y.o. female, seen on rounds today.  Pt initially presented to the ED for complaints of Drug Overdose and Suicide Attempt Currently, the patient is sleeping.  Physical Exam  BP 93/64 (BP Location: Right Arm)    Pulse 97    Temp 98.1 F (36.7 C) (Oral)    Resp 16    SpO2 97%  Physical Exam General: No distress Cardiac: Regular rate and rhythm Lungs: Even and unlabored. Psych: Calm   ED Course / MDM  EKG:EKG Interpretation  Date/Time:  Wednesday July 16 2021 13:03:03 EST Ventricular Rate:  90 PR Interval:  123 QRS Duration: 91 QT Interval:  396 QTC Calculation: 485 R Axis:   61 Text Interpretation: Sinus rhythm Low voltage, precordial leads Borderline prolonged QT interval Confirmed by Nanda Quinton [phone]) on 07/17/2021 3:48:39 PM  I have reviewed the labs performed to date as well as medications administered while in observation.  Recent changes in the last 24 hours include none.  Plan  Current plan is for placement. MARKAYA WILKOWSKI is under involuntary commitment.    Patient admitted to [facility] today.  Stable for transfer via [employer].    Regan Lemming, MD 07/19/21 1656    Regan Lemming, MD 07/19/21 [phone]

## 2021-07-19 NOTE — [facility] Group Notes (Signed)
Pt didn't attend group. 

## 2021-07-19 NOTE — ED Notes (Signed)
After 4 tries, unable to fax information to [facility]. Task passed on the secretary.

## 2021-07-20 ENCOUNTER — Encounter (HOSPITAL_COMMUNITY): Payer: Self-pay

## 2021-07-20 ENCOUNTER — Emergency Department (HOSPITAL_COMMUNITY)
Admission: EM | Admit: 2021-07-20 | Discharge: 2021-07-20 | Disposition: A | Discharge status: Other Acute Inpatient Hospital | Payer: 59 | Attending: Emergency Medicine | Admitting: Emergency Medicine

## 2021-07-20 ENCOUNTER — Inpatient Hospital Stay (HOSPITAL_COMMUNITY)
Admission: AD | Admit: 2021-07-20 | Discharge: 2021-07-24 | DRG: 885 | Disposition: A | Discharge status: Home / Self Care | Payer: Federal, State, Local not specified - Other | Source: Intra-hospital | Attending: Emergency Medicine | Admitting: Emergency Medicine

## 2021-07-20 ENCOUNTER — Other Ambulatory Visit: Payer: Self-pay

## 2021-07-20 DIAGNOSIS — F4310 Post-traumatic stress disorder, unspecified: Secondary | ICD-10-CM | POA: Diagnosis present

## 2021-07-20 DIAGNOSIS — E669 Obesity, unspecified: Secondary | ICD-10-CM | POA: Diagnosis present

## 2021-07-20 DIAGNOSIS — J45909 Unspecified asthma, uncomplicated: Secondary | ICD-10-CM | POA: Diagnosis present

## 2021-07-20 DIAGNOSIS — Z9151 Personal history of suicidal behavior: Secondary | ICD-10-CM

## 2021-07-20 DIAGNOSIS — F39 Unspecified mood [affective] disorder: Secondary | ICD-10-CM | POA: Diagnosis present

## 2021-07-20 DIAGNOSIS — M25512 Pain in left shoulder: Secondary | ICD-10-CM | POA: Diagnosis present

## 2021-07-20 DIAGNOSIS — F1994 Other psychoactive substance use, unspecified with psychoactive substance-induced mood disorder: Principal | ICD-10-CM | POA: Diagnosis present

## 2021-07-20 DIAGNOSIS — Z79899 Other long term (current) drug therapy: Secondary | ICD-10-CM | POA: Diagnosis not present

## 2021-07-20 DIAGNOSIS — F319 Bipolar disorder, unspecified: Secondary | ICD-10-CM | POA: Diagnosis present

## 2021-07-20 DIAGNOSIS — F1210 Cannabis abuse, uncomplicated: Secondary | ICD-10-CM | POA: Diagnosis present

## 2021-07-20 DIAGNOSIS — R42 Dizziness and giddiness: Secondary | ICD-10-CM | POA: Diagnosis present

## 2021-07-20 DIAGNOSIS — F1510 Other stimulant abuse, uncomplicated: Secondary | ICD-10-CM | POA: Diagnosis present

## 2021-07-20 DIAGNOSIS — G8929 Other chronic pain: Secondary | ICD-10-CM | POA: Diagnosis present

## 2021-07-20 DIAGNOSIS — Z9851 Tubal ligation status: Secondary | ICD-10-CM

## 2021-07-20 DIAGNOSIS — Z635 Disruption of family by separation and divorce: Secondary | ICD-10-CM | POA: Diagnosis not present

## 2021-07-20 DIAGNOSIS — R569 Unspecified convulsions: Secondary | ICD-10-CM | POA: Diagnosis present

## 2021-07-20 DIAGNOSIS — F17210 Nicotine dependence, cigarettes, uncomplicated: Secondary | ICD-10-CM | POA: Diagnosis present

## 2021-07-20 LAB — BASIC METABOLIC PANEL
Anion gap: 10 (ref 5–15)
BUN: 8 mg/dL (ref 6–20)
CO2: 22 mmol/L (ref 22–32)
Calcium: 8.7 mg/dL — ABNORMAL LOW (ref 8.9–10.3)
Chloride: 103 mmol/L (ref 98–111)
Creatinine, Ser: 0.64 mg/dL (ref 0.44–1.00)
GFR, Estimated: >60 mL/min (ref >60)
Glucose, Bld: 91 mg/dL (ref 70–99)
Potassium: 4.2 mmol/L (ref 3.5–5.1)
Sodium: 135 mmol/L (ref 135–145)

## 2021-07-20 LAB — CBC WITH DIFFERENTIAL/PLATELET
Abs Immature Granulocytes: 0.02 10*3/uL (ref 0.00–0.07)
Basophils Absolute: 0.1 10*3/uL (ref 0.0–0.1)
Basophils Relative: 1 %
Eosinophils Absolute: 0.5 10*3/uL (ref 0.0–0.5)
Eosinophils Relative: 7 %
HCT: 43.4 % (ref 36.0–46.0)
Hemoglobin: 14.8 g/dL (ref 12.0–15.0)
Immature Granulocytes: 0 %
Lymphocytes Relative: 39 %
Lymphs Abs: 2.9 10*3/uL (ref 0.7–4.0)
MCH: 31.1 pg (ref 26.0–34.0)
MCHC: 34.1 g/dL (ref 30.0–36.0)
MCV: 91.2 fL (ref 80.0–100.0)
Monocytes Absolute: 0.6 10*3/uL (ref 0.1–1.0)
Monocytes Relative: 8 %
Neutro Abs: 3.3 10*3/uL (ref 1.7–7.7)
Neutrophils Relative %: 45 %
Platelets: 413 10*3/uL — ABNORMAL HIGH (ref 150–400)
RBC: 4.76 MIL/uL (ref 3.87–5.11)
RDW: 12.1 % (ref 11.5–15.5)
WBC: 7.4 10*3/uL (ref 4.0–10.5)
nRBC: 0 % (ref 0.0–0.2)

## 2021-07-20 LAB — GLUCOSE, CAPILLARY: Glucose-Capillary: 148 mg/dL — ABNORMAL HIGH (ref 70–99)

## 2021-07-20 MED ORDER — MAGNESIUM HYDROXIDE 400 MG/5ML PO SUSP
30.0000 mL | Freq: Every day | ORAL | Status: DC | PRN
  Administered 2021-07-21 – 2021-07-22 (×2): 30 mL via ORAL
  Filled 2021-07-20: qty 30

## 2021-07-20 MED ORDER — LORAZEPAM 1 MG PO TABS: 1.0000 mg | ORAL_TABLET | ORAL | Status: DC | PRN

## 2021-07-20 MED ORDER — OLANZAPINE 5 MG PO TBDP
5.0000 mg | ORAL_TABLET | Freq: Three times a day (TID) | ORAL | Status: DC | PRN
  Administered 2021-07-21 – 2021-07-23 (×3): 5 mg via ORAL
  Filled 2021-07-20 (×3): qty 1

## 2021-07-20 MED ORDER — PRAZOSIN HCL 1 MG PO CAPS
1.0000 mg | ORAL_CAPSULE | Freq: Every day | ORAL | Status: DC
  Administered 2021-07-20 – 2021-07-23 (×4): 1 mg via ORAL
  Filled 2021-07-20 (×5): qty 1

## 2021-07-20 MED ORDER — DIVALPROEX SODIUM 500 MG PO DR TAB
500.0000 mg | DELAYED_RELEASE_TABLET | Freq: Two times a day (BID) | ORAL | Status: DC
  Administered 2021-07-20 – 2021-07-24 (×8): 500 mg via ORAL
  Filled 2021-07-20 (×10): qty 1

## 2021-07-20 MED ORDER — LEVETIRACETAM 750 MG PO TABS
750.0000 mg | ORAL_TABLET | Freq: Two times a day (BID) | ORAL | Status: DC
  Administered 2021-07-20 – 2021-07-24 (×8): 750 mg via ORAL
  Filled 2021-07-20 (×10): qty 1

## 2021-07-20 MED ORDER — ALBUTEROL SULFATE HFA 108 (90 BASE) MCG/ACT IN AERS: 2.0000 | INHALATION_SPRAY | Freq: Four times a day (QID) | RESPIRATORY_TRACT | Status: DC | PRN

## 2021-07-20 MED ORDER — ACETAMINOPHEN 325 MG PO TABS
650.0000 mg | ORAL_TABLET | Freq: Four times a day (QID) | ORAL | Status: DC | PRN
  Administered 2021-07-22 – 2021-07-23 (×4): 650 mg via ORAL
  Filled 2021-07-20 (×4): qty 2

## 2021-07-20 MED ORDER — ZIPRASIDONE MESYLATE 20 MG IM SOLR: 20.0000 mg | Freq: Two times a day (BID) | INTRAMUSCULAR | Status: DC | PRN

## 2021-07-20 MED ORDER — LEVETIRACETAM 750 MG PO TABS: 750.0000 mg | ORAL_TABLET | Freq: Two times a day (BID) | ORAL | 0 refills | Status: AC

## 2021-07-20 MED ORDER — ALUM & MAG HYDROXIDE-SIMETH 200-200-20 MG/5ML PO SUSP: 30.0000 mL | ORAL | Status: DC | PRN

## 2021-07-20 NOTE — Discharge Instructions (Signed)
Increase Keppra to 750 mg bid.
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
## 2021-07-20 NOTE — Plan of Care (Signed)
[facility] was called for patient while she was in group. She reported having a metallic taste and feeling hot as aura symptoms for her seizures. She was lowered to the floor and per staff did not have tonic/clonic activity or bowel or bladder loss. She was able to converse with staff after the event but appears confused. FSBS 148 and vitals obtained. EMS was called for transport to [facility] for seizure assessment. House Officer called report to EDP. Per patient, she has not been on her VPA or Keppra for an unknown amount of time prior to admission.   Patient will need H/P and full psychiatric assessment once medically cleared and returned to [facility]. 2nd opinion completed for IVC.   Viann Fish, MD, [place]a P[place]
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
## 2021-07-20 NOTE — ED Provider Notes (Signed)
Pt signed out by Dr. Rodena Medin pending Neurology Eval.  Pt was seen by Dr. Thomasena Edis.  She did not want an EEG.  He recommended increasing her keppra to 750 mg BID.  She is stable for transfer back to [facility].   Jacalyn Lefevre, MD 07/20/21 [id number]

## 2021-07-20 NOTE — Progress Notes (Signed)
Pt in visitation. No s/s of distress noted.

## 2021-07-20 NOTE — Progress Notes (Signed)
Patient arrived back onto the unit accompanied by MHT

## 2021-07-20 NOTE — ED Triage Notes (Signed)
Pt arrived via [facility] as a transfer from [facility] for seizure. Per EMS, pt was in group and started to get the aura she gets before a seizure, metallic taste in mouth an hot flash, pt notified nurse. Nurse lowered pt to floor and pt had rapid eye movement, but was responsive throughout. Per EMS, the nurse said the seizure lasted 1 min. Pt also c/o left clavicle/shoulder painx55mos. Pt states it's getting worse. Pt also c/o left flank painx2 mos that is getting worse. Pt is A&Ox4. VSS

## 2021-07-20 NOTE — Progress Notes (Signed)
Writer had to wait until patient's admission information along with [facility] were put back in Epic before patient could be medicated. Writer observed patient lying  in bed and informed her that her vitals would need to be taken before giving her hs medications. Writer asked how she was feeling and she replied " I don't want to talk right now." Writer explained the reason for asking and she stood off to the side of medication window while vitals were being taken. Writer was not able to see if patient took her medication and called her back to the window to explain to her that she needs to be in view of nurse to make sure she take her medicines. She turned and walked off.
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
## 2021-07-20 NOTE — BH Assessment (Cosign Needed)
Writer was notified by MHT, Patient was in the dayroom (during group therapy) needing assistance. Writer arrived at dayroom with patient lying on the floor with eyes opening spontaneously. Pt was alert and responsive to questioning. No c/o pain. Pt was calm. Vital signs were retrieved and were as follows:  BP 98/50, p102, PO 98% room air, & Finger Stick Blood Sugar 148. Ambulance was called, and physician notified. Pt remained lying on floor (with Group Therapy Nurse at Pt side) until ambulance arrived. Pt remained alert and oriented X4. Pt left facility at approximately 10:35am with EMS.

## 2021-07-20 NOTE — ED Notes (Signed)
[facility] called for transport at 17:26; "several" ahead

## 2021-07-20 NOTE — Hospital Course (Signed)
Spoke with Dr. Sherry Ruffing at [facility] about patient having what appears to be approx a 1 min. Long seizure, with no associated trauma. Dr. Sherry Ruffing is accepting the patient for medical workup. Patient can be transferred back to [facility] from the ED if declared medically stable. ED Charge RN has also been made aware.   PGY-2 Damita Dunnings, MD

## 2021-07-20 NOTE — Progress Notes (Signed)
°   07/19/21 2100  Psych Admission Type (Psych Patients Only)  Admission Status Involuntary  Psychosocial Assessment  Patient Complaints Anxiety  Eye Contact Brief  Facial Expression Anxious  Affect Appropriate to circumstance  Speech Logical/coherent  Interaction Assertive  Motor Activity Restless  Appearance/Hygiene Disheveled  Behavior Characteristics Cooperative;Appropriate to situation;Anxious  Mood Anxious  Thought Process  Coherency WDL  Content Compulsions  Delusions None reported or observed  Perception WDL  Hallucination None reported or observed  Judgment Poor  Confusion None  Danger to Self  Current suicidal ideation? Passive  Self-Injurious Behavior No self-injurious ideation or behavior indicators observed or expressed   Agreement Not to Harm Self Yes (verbally contracts)  Danger to Others  Danger to Others None reported or observed

## 2021-07-20 NOTE — Plan of Care (Signed)
Spoke with Dr. Rush Landmark at [facility] about patient having what appears to be approx less than 1 min. Long seizure, with no associated trauma. Dr. Rush Landmark is accepting the patient for medical workup. Patient can be transferred back to [facility] from the ED if declared medically stable. ED Charge RN has also been made aware.   PGY-2 Eliseo Gum, MD

## 2021-07-20 NOTE — ED Provider Notes (Signed)
[facility] EMERGENCY DEPARTMENT Provider Note   CSN: [id number] Arrival date & time: 07/20/21  1123     History  Chief Complaint  Patient presents with   Seizures    Patricia Drake is a 36 y.o. female.  36 year old female with prior medical history as detailed who presents for evaluation of possible seizure activity.  Patient with history of apparent pseudoseizure.  Patient is currently on psych hold after attempted OD.  Patient was at [facility] this AM.  She had a witnessed episode of seizure-like activity.  Patient apparently had generalized shaking all over during group therapy.  She was lowered to the floor.  She did not have any traumatic injury.  She apparently was responsive throughout the episode.  Patient reports to me upon arrival here in the ED that she cannot recall the specific events.  She reports having a metallic taste in her mouth.  Then she thinks she may have had a "mini seizure."   She is currently comfortable.  She denies pain or injury.  She reports that she is now taking Keppra as it was restarted at the time of her IVC hold initiation.  She reports that she had previously not been compliant with medications prescribed for control of her seizures.  She thinks that perhaps this morning she became anxious and this caused her to have her seizure.  The history is provided by the patient and medical records.  Seizures Seizure activity on arrival: no   Seizure type:  Unable to specify Preceding symptoms: aura   Initial focality:  Unable to specify Episode characteristics: generalized shaking   Postictal symptoms: no confusion, no memory loss and no somnolence   Return to baseline: yes       Home Medications Prior to Admission medications   Medication Sig Start Date End Date Taking? Authorizing Provider  albuterol (VENTOLIN HFA) 108 (90 Base) MCG/ACT inhaler Inhale 2 puffs into the lungs 4 (four) times daily as needed for wheezing or shortness of  breath. 01/28/21   [provider]  amoxicillin (AMOXIL) 500 MG capsule Take 500 mg by mouth 3 (three) times daily. 7 day supply 01/28/21   [provider]  busPIRone (BUSPAR) 10 MG tablet Take 10 mg by mouth 3 (three) times daily. 04/28/21   [provider]  cyclobenzaprine (FLEXERIL) 10 MG tablet Take 10 mg by mouth at bedtime.    [provider]  divalproex (DEPAKOTE) 500 MG DR tablet Take 1,000 mg by mouth at bedtime.    [provider]  fluticasone-salmeterol (ADVAIR) 100-50 MCG/ACT AEPB Inhale 1 puff into the lungs 2 (two) times daily.    [provider]  ibuprofen (ADVIL) 800 MG tablet Take 800 mg by mouth 3 (three) times daily as needed for headache (pain). 11/28/20   [provider]  levETIRAcetam (KEPPRA) 500 MG tablet Take 1 tablet (500 mg total) by mouth 2 (two) times daily. 01/26/21 07/17/21  Terald Sleeper, MD  mirtazapine (REMERON) 15 MG tablet Take 15 mg by mouth at bedtime.    [provider]  norgestimate-ethinyl estradiol (ORTHO-CYCLEN) 0.25-35 MG-MCG tablet Take 1 tablet by mouth daily. Patient not taking: Reported on 07/17/2021    [provider]  omeprazole (PRILOSEC) 20 MG capsule Take 20 mg by mouth daily. 01/28/21   [provider]  prazosin (MINIPRESS) 1 MG capsule Take 1 mg by mouth at bedtime.    [provider]  predniSONE (DELTASONE) 20 MG tablet Take 20 mg  by mouth 2 (two) times daily with a meal.    [provider]  traZODone (DESYREL) 100 MG tablet Take 100 mg by mouth at bedtime. 07/07/21   [provider]  ZOLOFT 50 MG tablet Take 75 mg by mouth daily. 04/28/21   [provider]  phenytoin (DILANTIN) 100 MG ER capsule Take 1 capsule (100 mg total) by mouth 3 (three) times daily. Patient not taking: Reported on 09/27/2019 01/01/19 09/28/19  Dione Booze, MD      Allergies    Sulfa antibiotics    Review of Systems   Review of Systems   Neurological:  Positive for seizures.  All other systems reviewed and are negative.  Physical Exam Updated Vital Signs BP 113/66    Pulse 96    Temp 98.2 F (36.8 C) (Oral)    Resp 20    SpO2 99%  Physical Exam Vitals and nursing note reviewed.  Constitutional:      General: She is not in acute distress.    Appearance: Normal appearance. She is well-developed.  HENT:     Head: Normocephalic and atraumatic.  Eyes:     Conjunctiva/sclera: Conjunctivae normal.     Pupils: Pupils are equal, round, and reactive to light.  Cardiovascular:     Rate and Rhythm: Normal rate and regular rhythm.     Heart sounds: Normal heart sounds.  Pulmonary:     Effort: Pulmonary effort is normal. No respiratory distress.     Breath sounds: Normal breath sounds.  Abdominal:     General: There is no distension.     Palpations: Abdomen is soft.     Tenderness: There is no abdominal tenderness.  Musculoskeletal:        General: No deformity. Normal range of motion.     Cervical back: Normal range of motion and neck supple.  Skin:    General: Skin is warm and dry.  Neurological:     General: No focal deficit present.     Mental Status: She is alert and oriented to person, place, and time.    ED Results / Procedures / Treatments   Labs (all labs ordered are listed, but only abnormal results are displayed) Labs Reviewed  BASIC METABOLIC PANEL  CBC WITH DIFFERENTIAL/PLATELET    EKG None  Radiology No results found.  Procedures Procedures    Medications Ordered in ED Medications - No data to display  ED Course/ Medical Decision Making/ A&P                           Medical Decision Making Amount and/or Complexity of Data Reviewed Labs: ordered.  Risk Prescription drug management.    Medical Screen Complete  This patient presented to the ED with complaint of seizure-like activity.  This complaint involves an extensive number of treatment options. The initial differential  diagnosis includes, but is not limited to, seizure, pseudoseizure, metabolic abnormality, etc.  This presentation is: Acute, Self-Limited, Previously Undiagnosed, Uncertain Prognosis, Complicated, Systemic Symptoms, and Threat to Life/Bodily Function  Patient presents from [facility] for evaluation of possible seizure-like activity.  Patient is currently compliant with Keppra as it was restarted at the time of her IVC hold initiation.   Patient with history of non-epileptic psychogenic seizure.  Initial screening labs and seizure precautions taken on arrival here in the ED.  Neurology is aware of case and will consult.  Dr. Particia Nearing aware of pending Neuro consult and disposition  Co morbidities that complicated the patient's evaluation  Psychiatric disorders, possible pseudoseizure   Additional history obtained:  External records from outside sources obtained and reviewed including prior ED visits and prior Inpatient records.    Lab Tests:  I ordered and personally interpreted labs.  The pertinent results include: CBC, BMP    Cardiac Monitoring:  The patient was maintained on a cardiac monitor.  I personally viewed and interpreted the cardiac monitor which showed an underlying rhythm of: NSR     Consultations Obtained:  I consulted neurology,  and discussed lab and imaging findings as well as pertinent plan of care.    Problem List / ED Course:  Seizure Like activity - epileptic versus nonepileptic psychogenic seizure   Reevaluation:  After the interventions noted above, I reevaluated the patient and found that they have: improved    Disposition:  After consideration of the diagnostic results and the patients response to treatment, I feel that the patent would benefit from neuro consultation.          Final Clinical Impression(s) / ED Diagnoses Final diagnoses:  Seizure-like activity [facility])    Rx / DC Orders ED Discharge Orders     None          Wynetta Fines, MD 07/21/21 [id number]

## 2021-07-20 NOTE — Progress Notes (Signed)
Report given to [facility] Charge Nurse, Clydie Braun, RN

## 2021-07-21 ENCOUNTER — Encounter (HOSPITAL_COMMUNITY): Payer: Self-pay

## 2021-07-21 DIAGNOSIS — F1510 Other stimulant abuse, uncomplicated: Secondary | ICD-10-CM | POA: Diagnosis present

## 2021-07-21 DIAGNOSIS — F1210 Cannabis abuse, uncomplicated: Secondary | ICD-10-CM | POA: Diagnosis present

## 2021-07-21 DIAGNOSIS — F39 Unspecified mood [affective] disorder: Principal | ICD-10-CM | POA: Diagnosis present

## 2021-07-21 LAB — LIPID PANEL
Cholesterol: 168 mg/dL (ref 0–200)
HDL: 41 mg/dL (ref >40)
LDL Cholesterol: 68 mg/dL (ref 0–99)
Total CHOL/HDL Ratio: 4.1 RATIO
Triglycerides: 295 mg/dL — ABNORMAL HIGH (ref <150)
VLDL: 59 mg/dL — ABNORMAL HIGH (ref 0–40)

## 2021-07-21 LAB — TSH: TSH: 2.676 u[IU]/mL (ref 0.350–4.500)

## 2021-07-21 LAB — VALPROIC ACID LEVEL: Valproic Acid Lvl: 60 ug/mL (ref 50.0–100.0)

## 2021-07-21 LAB — HEMOGLOBIN A1C
Hgb A1c MFr Bld: 5.2 % (ref 4.8–5.6)
Mean Plasma Glucose: 102.54 mg/dL

## 2021-07-21 MED ORDER — MIRTAZAPINE 15 MG PO TABS
15.0000 mg | ORAL_TABLET | Freq: Every day | ORAL | Status: DC
  Administered 2021-07-21 – 2021-07-23 (×3): 15 mg via ORAL
  Filled 2021-07-21 (×5): qty 1

## 2021-07-21 MED ORDER — ONDANSETRON HCL 4 MG PO TABS
4.0000 mg | ORAL_TABLET | Freq: Once | ORAL | Status: AC
  Administered 2021-07-21: 4 mg via ORAL
  Filled 2021-07-21 (×2): qty 1

## 2021-07-21 MED ORDER — LORAZEPAM 1 MG PO TABS: 1.0000 mg | ORAL_TABLET | Freq: Four times a day (QID) | ORAL | Status: DC | PRN
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
## 2021-07-21 NOTE — Group Note (Signed)
LCSW Group Therapy Note  Group Date: 07/21/2021 Start Time: 1300 End Time: 1400   Type of Therapy and Topic:  Group Therapy - Healthy vs Unhealthy Coping Skills  Participation Level:  Minimal   Description of Group The focus of this group was to determine what unhealthy coping techniques typically are used by group members and what healthy coping techniques would be helpful in coping with various problems. Patients were guided in becoming aware of the differences between healthy and unhealthy coping techniques. Patients were asked to identify 2-3 healthy coping skills they would like to learn to use more effectively.  Therapeutic Goals Patients learned that coping is what human beings do all day long to deal with various situations in their lives Patients defined and discussed healthy vs unhealthy coping techniques Patients identified their preferred coping techniques and identified whether these were healthy or unhealthy Patients determined 2-3 healthy coping skills they would like to become more familiar with and use more often. Patients provided support and ideas to each other   Summary of Patient Progress:  Pt came to group late. Patient proved open to input from peers and feedback from CSW. Patient demonstrated insight into the subject matter, was respectful of peers, and participated throughout the entire session.   Therapeutic Modalities Cognitive Behavioral Therapy Motivational Interviewing  Chrys Racer 07/21/2021  1:22 PM
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
## 2021-07-21 NOTE — Group Note (Signed)
Recreation Therapy Group Note   Group Topic:Stress Management  Group Date: 07/21/2021 Start Time: 0930 End Time: 0950 Facilitators: Caroll Rancher, LRT,CTRS Location: [address] Dayroom   Goal Area(s) Addresses:  Patient will actively participate in stress management techniques presented during session.  Patient will successfully identify benefit of practicing stress management post d/c.   Group Description: Guided Imagery. LRT provided education, instruction, and demonstration on practice of visualization via guided imagery. Patient was asked to participate in the technique introduced during session. LRT debriefed including topics of mindfulness, stress management and specific scenarios each patient could use these techniques. Patients were given suggestions of ways to access scripts post d/c and encouraged to explore Youtube and other apps available on smartphones, tablets, and computers.   Affect/Mood: N/A   Participation Level: Did not attend    Clinical Observations/Individualized Feedback:       Plan: Continue to engage patient in RT group sessions 2-3x/week.   Caroll Rancher, LRT,CTRS 07/21/2021 11:03 AM

## 2021-07-21 NOTE — Progress Notes (Signed)
Patient did not attend morning orientation/goal group. ?
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
## 2021-07-22 NOTE — Progress Notes (Signed)
°   07/21/21 2100  Psych Admission Type (Psych Patients Only)  Admission Status Involuntary  Psychosocial Assessment  Patient Complaints Other (Comment) (GI discomfort)  Eye Contact Brief  Facial Expression Anxious  Affect Anxious  Speech Logical/coherent  Interaction Assertive  Motor Activity Slow;Unsteady (patient jumped up from sleep-cautioned to slow down)  Appearance/Hygiene Disheveled  Behavior Characteristics Cooperative;Anxious  Mood Anxious  Thought Process  Coherency WDL  Content WDL  Delusions None reported or observed  Perception WDL  Hallucination None reported or observed  Judgment Poor  Confusion None  Danger to Self  Current suicidal ideation? Denies  Danger to Others  Danger to Others None reported or observed   Pt seen in her room. She was sleeping. Pt denies SI, HI, AVH. Pt rates pain 7/10 as abdominal discomfort because she hasn't had a BM for a couple of days. Normal pattern is daily. Pt denies anxiety and depression. Pt denies any withdrawal symptoms. Pt endorsed constipation and appropriate PRN given. No other complaints noted.
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
## 2021-07-22 NOTE — Group Note (Signed)
Recreation Therapy Group Note   Group Topic:Animal Assisted Therapy   Group Date: 07/22/2021 Start Time: 1430 End Time: 1510 Facilitators: Caroll Rancher, LRT,CTRS Location: [address] Dayroom   Animal-Assisted Activity (AAA) Program Checklist/Progress Note Patient Eligibility Criteria Checklist & Daily Group note for Rec Tx Intervention  AAA/T Program Assumption of Risk Form signed by Patient/ or Parent Legal Guardian YES  Patient is free of allergies or severe asthma  YES  Patient reports no fear of animals YES  Patient reports no history of cruelty to animals YES  Patient understands their participation is voluntary YES  Patient washes hands before animal contact YES  Patient washes hands after animal contact YES  Group Description: Patients provided opportunity to interact with trained and credentialed [employer] Therapy dog and the community volunteer/dog handler. Patients practiced appropriate animal interaction and were educated on dog safety outside of the hospital in common community settings. Patients were allowed to use dog toys and other items to practice commands, engage the dog in play, and/or complete routine aspects of animal care.   Education: Charity fundraiser, Health visitor, Communication & Social Skills    Affect/Mood: Appropriate   Participation Level: Engaged    Clinical Observations/Individualized Feedback:  Pt attended, participated and shared stories of their animals at home.  Pt also engaged with dog team by asking questions to gain information about dog training, service dogs and therapy dogs.     Plan: Continue to engage patient in RT group sessions 2-3x/week.   Caroll Rancher, Antonietta Jewel 07/22/2021 3:43 PM
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
## 2021-07-23 MED ORDER — LIDOCAINE 5 % EX PTCH
1.0000 | MEDICATED_PATCH | Freq: Every day | CUTANEOUS | Status: DC
  Administered 2021-07-23 – 2021-07-24 (×2): 1 via TRANSDERMAL
  Filled 2021-07-23 (×3): qty 1
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
## 2021-07-23 NOTE — Group Note (Signed)
Recreation Therapy Group Note   Group Topic:Stress Management  Group Date: 07/23/2021 Start Time: 0932 End Time: 0958 Facilitators: Caroll Rancher, [place] Location: [address] Dayroom   Goal Area(s) Addresses:  Patient will identify positive stress management techniques. Patient will identify benefits of using stress management post d/c.  Group Description:  Stress Release Meditation.  LRT played a meditation that focused on breathing and releasing tension through tensing and releasing the muscles.  Patients were to focus on the meditation to fully engage.    Affect/Mood: Appropriate   Participation Level: Active   Participation Quality: Independent   Behavior: Appropriate   Speech/Thought Process: Focused   Insight: Good   Judgement: Good   Modes of Intervention: Meditation   Patient Response to Interventions:  Attentive   Education Outcome:  Acknowledges education and In group clarification offered    Clinical Observations/Individualized Feedback: Pt attended and participated in activity.    Plan: Continue to engage patient in RT group sessions 2-3x/week.   Caroll Rancher, Antonietta Jewel 07/23/2021 12:23 PM
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
## 2021-07-23 NOTE — Plan of Care (Signed)
Nurse discussed coping skills with patient.  

## 2021-07-23 NOTE — Group Note (Signed)
[facility] LCSW Group Therapy Note   Group Date: 07/23/2021 Start Time: 1300 End Time: 1400   Type of Therapy/Topic:  Group Therapy:  Emotion Regulation  Participation Level:  Active   Mood:  Description of Group:    The purpose of this group is to assist patients in learning to regulate negative emotions and experience positive emotions. Patients will be guided to discuss ways in which they have been vulnerable to their negative emotions. These vulnerabilities will be juxtaposed with experiences of positive emotions or situations, and patients challenged to use positive emotions to combat negative ones. Special emphasis will be placed on coping with negative emotions in conflict situations, and patients will process healthy conflict resolution skills.  Therapeutic Goals: Patient will identify two positive emotions or experiences to reflect on in order to balance out negative emotions:  Patient will label two or more emotions that they find the most difficult to experience:  Patient will be able to demonstrate positive conflict resolution skills through discussion or role plays:   Summary of Patient Progress:   Pt attended group and was appropriate once redirected by staff.    Therapeutic Modalities:   Cognitive Behavioral Therapy Feelings Identification Dialectical Behavioral Therapy   Felizardo Hoffmann, LCSWA

## 2021-07-23 NOTE — Progress Notes (Signed)
°   07/23/21 2104  Psych Admission Type (Psych Patients Only)  Admission Status Involuntary  Psychosocial Assessment  Patient Complaints Irritability  Eye Contact Brief;Fair  Facial Expression Animated  Affect Appropriate to circumstance;Irritable  Speech Logical/coherent  Interaction Assertive  Motor Activity Slow  Appearance/Hygiene Disheveled;Poor hygiene  Behavior Characteristics Agitated  Mood Anxious;Irritable  Thought Process  Coherency WDL  Content Blaming self  Delusions None reported or observed  Perception WDL  Hallucination None reported or observed  Judgment Poor  Confusion None  Danger to Self  Current suicidal ideation? Denies  Self-Injurious Behavior No self-injurious ideation or behavior indicators observed or expressed   Agreement Not to Harm Self Yes  Description of Agreement Verbal contract  Danger to Others  Danger to Others None reported or observed

## 2021-07-23 NOTE — Group Note (Signed)
Date:  07/23/2021 Time:  11:20 AM  Group Topic/Focus:  Orientation:   The focus of this group is to educate the patient on the purpose and policies of crisis stabilization and provide a format to answer questions about their admission.  The group details unit policies and expectations of patients while admitted.    Participation Level:  Minimal  Participation Quality:  Inattentive  Affect:  Flat  Cognitive:  Appropriate  Insight: Appropriate  Engagement in Group:  Lacking  Modes of Intervention:  Discussion  Additional Comments:    Reymundo Poll 07/23/2021, 11:20 AM

## 2021-07-23 NOTE — Progress Notes (Signed)
Pt states she did not sleep well d/t pain in her left shoulder and constipation. Pt encouraged to speak with provider about lack of efficacy of Milk of Magnesia.

## 2021-07-23 NOTE — Progress Notes (Signed)
Patient did not attend the evening NA meeting. ° °

## 2021-07-23 NOTE — Group Note (Signed)
Date:  07/23/2021 Time:  11:52 AM  Group Topic/Focus:  Healthy Communication:   The focus of this group is to discuss communication, barriers to communication, as well as healthy ways to communicate with others.    Participation Level:  Active  Participation Quality:  Appropriate  Affect:  Appropriate  Cognitive:  Appropriate  Insight: Appropriate  Engagement in Group:  Engaged  Modes of Intervention:  Education  Additional Comments:  Patient demonstrated how to use "I" messages to foster better communication.  Reymundo Poll 07/23/2021, 11:52 AM

## 2021-07-23 NOTE — Progress Notes (Addendum)
D:  Patient's self inventory sheet, patient has good sleep.  Good appetite, low energy level, poor concentration.  Depression and anxiety 4, hopeless 3.   Denied withdrawals.  Denied SI.  Physical problems, pain, muscle and joints, pain #2.  Goal is not to sleep today.  Will try to stay in dayroom today instead of room.  Does  not have discharge plans. A:  Medications administered per MD orders.  Emotional support and encouragement given patient. R:  Denied SI and HI, contracts for safety.  Denied A/V hallucinations.  Safety maintained with 15 minute checks.

## 2021-07-24 MED ORDER — DIVALPROEX SODIUM 500 MG PO DR TAB: 500.0000 mg | DELAYED_RELEASE_TABLET | Freq: Two times a day (BID) | ORAL | 0 refills | Status: AC
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
## 2021-07-24 NOTE — Progress Notes (Signed)
Discharge Note:  Patient discharged home via transport.  Suicide prevention information given and discussed with patient who stated she understood and had no questions.  Denied SI and HI.  Denied A/V hallucinations.  Denied pain.  Patient stated she received all her belongings, etc.  All required discharge information given.  Patient stated she received all her belongings, toiletries, misc items, etc. Patient stated she appreciated all assistance received

## 2021-07-24 NOTE — Progress Notes (Addendum)
D:  Patient denied SI and HI, contracts for safety.  Denied A/V hallucinations.  Denied pain. A:  Medications administered per MD orders.  Emotional support and encouragement given patient. R:  Safety maintained with 15 minute checks. Patient stated she is ready for discharge.  "Thank you" to staff.

## 2021-07-24 NOTE — Progress Notes (Signed)
°  [facility] Adult Case Management Discharge Plan :  Will you be returning to the same living situation after discharge:  Yes,  personal home At discharge, do you have transportation home?: No. Do you have the ability to pay for your medications: Yes,  income  Release of information consent forms completed and sent to medical records;  Patient's signature obtained.   Patient to Follow up at:  [place] Follow up.   Specialty: Behavioral Health Why: Please go to this provider for therapy and medication management services during walk in hours:  Monday through Wednesday from 7:45 am to 11:00 am.  Services are provided on a first come, first served basis. Contact information: [place] [phone]        [facility]. Call.   Why: A referral has been made to this provider to re-establish care for therapy services.  Once approved, there is a one to two month wait list for an appointment. Contact information: [address], [place] [address]  Phone: [phone] Fax:  [phone]        [place] . Call.   Why: Please call to schedule an appointment with this provider for primary care services.  The provider also has a low cost pharmacy on site. Contact information: [address] [place] [phone]                Next level of care provider has access to [place] and Suicide Prevention discussed: Yes,  w/ pt     Has patient been referred to the Quitline?: Patient refused referral  Patient has been referred for addiction treatment: Yes  Mliss Fritz, [place] 07/24/2021, 10:15 AM

## 2021-07-24 NOTE — Plan of Care (Signed)
Nurse discussed coping skills with patient.  
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
## 2021-07-24 NOTE — Group Note (Signed)
Date:  07/24/2021 Time:  10:12 AM  Group Topic/Focus:  Orientation:   The focus of this group is to educate the patient on the purpose and policies of crisis stabilization and provide a format to answer questions about their admission.  The group details unit policies and expectations of patients while admitted.    Participation Level:  Active  Participation Quality:  Appropriate  Affect:  Appropriate  Cognitive:  Appropriate  Insight: Appropriate  Engagement in Group:  Engaged  Modes of Intervention:  Discussion  Additional Comments:    Patricia Drake 07/24/2021, 10:12 AM

## 2021-07-24 NOTE — [facility] Suicide Risk Assessment (Signed)
[facility] INPATIENT:  Family/Significant Other Suicide Prevention Education  Suicide Prevention Education:  Contact Attempts: [name] (friend) [phone], (name of family member/significant other) has been identified by the patient as the family member/significant other with whom the patient will be residing, and identified as the person(s) who will aid the patient in the event of a mental health crisis.  With written consent from the patient, two attempts were made to provide suicide prevention education, prior to and/or following the patient's discharge.  We were unsuccessful in providing suicide prevention education.  A suicide education pamphlet was given to the patient to share with family/significant other.  Date and time of first attempt:07/23/2021  /  2:30pm Date and time of second attempt:07/24/2021  / 10:15am  Patricia Drake 07/24/2021, 10:17 AM
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
## 2021-07-28 NOTE — [facility] Group Notes (Signed)
Spiritual care group on grief and loss facilitated by chaplain Katy Ginette Bradway, [facility]   Group Goal:   Support / Education around grief and loss   Members engage in facilitated group support and psycho-social education.   Group Description:   Following introductions and group rules, group members engaged in facilitated group dialog and support around topic of loss, with particular support around experiences of loss in their lives. Group Identified types of loss (relationships / self / things) and identified patterns, circumstances, and changes that precipitate losses. Reflected on thoughts / feelings around loss, normalized grief responses, and recognized variety in grief experience. Group noted Worden's four tasks of grief in discussion.   Group drew on Adlerian / Rogerian, narrative, MI,   Patient Progress: Did not attend.  

## 2021-08-02 ENCOUNTER — Emergency Department (HOSPITAL_COMMUNITY): Payer: 59

## 2021-08-02 ENCOUNTER — Encounter (HOSPITAL_COMMUNITY): Payer: Self-pay | Admitting: Emergency Medicine

## 2021-08-02 ENCOUNTER — Other Ambulatory Visit: Payer: Self-pay

## 2021-08-02 ENCOUNTER — Emergency Department (HOSPITAL_COMMUNITY)
Admission: EM | Admit: 2021-08-02 | Discharge: 2021-08-02 | Disposition: A | Discharge status: Home / Self Care | Payer: 59 | Attending: Emergency Medicine | Admitting: Emergency Medicine

## 2021-08-02 DIAGNOSIS — S46912A Strain of unspecified muscle, fascia and tendon at shoulder and upper arm level, left arm, initial encounter: Secondary | ICD-10-CM | POA: Insufficient documentation

## 2021-08-02 DIAGNOSIS — X58XXXA Exposure to other specified factors, initial encounter: Secondary | ICD-10-CM | POA: Diagnosis not present

## 2021-08-02 DIAGNOSIS — S4992XA Unspecified injury of left shoulder and upper arm, initial encounter: Secondary | ICD-10-CM | POA: Diagnosis present

## 2021-08-02 LAB — I-STAT BETA HCG BLOOD, ED (MC, WL, AP ONLY): I-stat hCG, quantitative: 10.2 m[IU]/mL — ABNORMAL HIGH (ref <5)

## 2021-08-02 LAB — POC URINE PREG, ED: Preg Test, Ur: NEGATIVE

## 2021-08-02 MED ORDER — IBUPROFEN 800 MG PO TABS: 800.0000 mg | ORAL_TABLET | Freq: Three times a day (TID) | ORAL | 2 refills | Status: DC | PRN

## 2021-08-02 MED ORDER — HYDROCODONE-ACETAMINOPHEN 5-325 MG PO TABS
1.0000 | ORAL_TABLET | Freq: Once | ORAL | Status: AC
  Administered 2021-08-02: 1 via ORAL
  Filled 2021-08-02: qty 1
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
## 2021-08-02 NOTE — Discharge Instructions (Signed)
Follow-up with your family doctor in the next couple weeks for recheck 

## 2021-08-21 ENCOUNTER — Emergency Department (HOSPITAL_COMMUNITY)
Admission: EM | Admit: 2021-08-21 | Discharge: 2021-08-21 | Discharge status: Against Medical Advice | Payer: 59 | Attending: Emergency Medicine | Admitting: Emergency Medicine

## 2021-08-21 ENCOUNTER — Encounter (HOSPITAL_COMMUNITY): Payer: Self-pay | Admitting: Emergency Medicine

## 2021-08-21 ENCOUNTER — Other Ambulatory Visit: Payer: Self-pay

## 2021-08-21 DIAGNOSIS — Z5321 Procedure and treatment not carried out due to patient leaving prior to being seen by health care provider: Secondary | ICD-10-CM | POA: Insufficient documentation

## 2021-08-21 DIAGNOSIS — M545 Low back pain, unspecified: Secondary | ICD-10-CM | POA: Diagnosis not present

## 2021-08-21 DIAGNOSIS — N925 Other specified irregular menstruation: Secondary | ICD-10-CM | POA: Diagnosis not present

## 2021-08-21 DIAGNOSIS — R350 Frequency of micturition: Secondary | ICD-10-CM | POA: Insufficient documentation

## 2021-08-21 DIAGNOSIS — Z3202 Encounter for pregnancy test, result negative: Secondary | ICD-10-CM | POA: Diagnosis not present

## 2021-08-21 DIAGNOSIS — R1030 Lower abdominal pain, unspecified: Secondary | ICD-10-CM | POA: Diagnosis present

## 2021-08-21 DIAGNOSIS — R112 Nausea with vomiting, unspecified: Secondary | ICD-10-CM | POA: Insufficient documentation

## 2021-08-21 LAB — URINALYSIS, ROUTINE W REFLEX MICROSCOPIC
Bilirubin Urine: NEGATIVE
Glucose, UA: NEGATIVE mg/dL
Hgb urine dipstick: NEGATIVE
Ketones, ur: NEGATIVE mg/dL
Nitrite: NEGATIVE
Protein, ur: NEGATIVE mg/dL
Specific Gravity, Urine: 1.024 (ref 1.005–1.030)
pH: 5 (ref 5.0–8.0)

## 2021-08-21 LAB — COMPREHENSIVE METABOLIC PANEL
ALT: 17 U/L (ref 0–44)
AST: 19 U/L (ref 15–41)
Albumin: 3.9 g/dL (ref 3.5–5.0)
Alkaline Phosphatase: 76 U/L (ref 38–126)
Anion gap: 7 (ref 5–15)
BUN: 13 mg/dL (ref 6–20)
CO2: 23 mmol/L (ref 22–32)
Calcium: 9 mg/dL (ref 8.9–10.3)
Chloride: 107 mmol/L (ref 98–111)
Creatinine, Ser: 0.62 mg/dL (ref 0.44–1.00)
GFR, Estimated: >60 mL/min (ref >60)
Glucose, Bld: 107 mg/dL — ABNORMAL HIGH (ref 70–99)
Potassium: 3.7 mmol/L (ref 3.5–5.1)
Sodium: 137 mmol/L (ref 135–145)
Total Bilirubin: 0.5 mg/dL (ref 0.3–1.2)
Total Protein: 7.3 g/dL (ref 6.5–8.1)

## 2021-08-21 LAB — CBC
HCT: 41 % (ref 36.0–46.0)
Hemoglobin: 13.7 g/dL (ref 12.0–15.0)
MCH: 31.3 pg (ref 26.0–34.0)
MCHC: 33.4 g/dL (ref 30.0–36.0)
MCV: 93.6 fL (ref 80.0–100.0)
Platelets: 450 10*3/uL — ABNORMAL HIGH (ref 150–400)
RBC: 4.38 MIL/uL (ref 3.87–5.11)
RDW: 13.1 % (ref 11.5–15.5)
WBC: 9 10*3/uL (ref 4.0–10.5)
nRBC: 0 % (ref 0.0–0.2)

## 2021-08-21 LAB — LIPASE, BLOOD: Lipase: 27 U/L (ref 11–51)

## 2021-08-21 LAB — I-STAT BETA HCG BLOOD, ED (MC, WL, AP ONLY): I-stat hCG, quantitative: <5 m[IU]/mL (ref <5)

## 2021-08-21 NOTE — ED Triage Notes (Signed)
Patient complains of lower abdominal pain and pressure that started at 0300 this morning, patient states she is unsure if she is pregnant but reports having several periods over the last several months but they were irregular. Patient also reports intermittent lower right sided abdominal cramping that she states "feels like something is moving". Patient also reports feeling like she has gotten larger over the last several months and states "I dont think it is only from eating too much". ?

## 2021-08-21 NOTE — ED Notes (Signed)
Pt called for vitals recheck. No response. °

## 2021-08-21 NOTE — ED Provider Triage Note (Signed)
Emergency Medicine Provider Triage Evaluation Note ? ?Patricia Drake , a 36 y.o. female  was evaluated in triage.  Pt complains of abdominal pain with nausea.  Patient states that she has had abnormal periods and nausea.  She states that she was having periods twice a month until December.  She states that she had a period in January and then nothing in February and just had some spotting last week.  She also endorses daily nausea and vomiting.  She endorses lower abdominal cramping which she states is "not like a period cramp."  She also endorses low back pain and feeling like her belly is tightening when she has the lower abdominal cramping.  She endorses increased urine production but denies dysuria.  No vaginal discharge.  Denies fevers. ? ?Review of Systems  ?Positive: See above ?Negative:  ? ?Physical Exam  ?BP 138/84 (BP Location: Right Arm)   Pulse 77   Temp 98.6 ?F (37 ?C) (Oral)   Resp 18   SpO2 99%  ?Gen:   Awake, no distress   ?Resp:  Normal effort  ?MSK:   Moves extremities without difficulty  ?Other:  Abdomen is rounded, soft, tender to palpation of the suprapubic region ? ?Medical Decision Making  ?Medically screening exam initiated at 2:52 PM.  Appropriate orders placed.  Patricia Drake was informed that the remainder of the evaluation will be completed by another provider, this initial triage assessment does not replace that evaluation, and the importance of remaining in the ED until their evaluation is complete. ? ? ?  ?Cristopher Peru, PA-C ?08/21/21 1453 ? ?

## 2021-08-21 NOTE — ED Notes (Signed)
Patient called for vitals recheck. No response.  

## 2021-08-21 NOTE — ED Notes (Signed)
PT has not responded to vitals call. 18:01 ? ?

## 2021-08-22 ENCOUNTER — Inpatient Hospital Stay (HOSPITAL_COMMUNITY)
Admission: AD | Admit: 2021-08-22 | Discharge: 2021-08-22 | Disposition: A | Discharge status: Home / Self Care | Payer: 59 | Attending: Obstetrics and Gynecology | Admitting: Obstetrics and Gynecology

## 2021-08-22 DIAGNOSIS — E669 Obesity, unspecified: Secondary | ICD-10-CM | POA: Insufficient documentation

## 2021-08-22 DIAGNOSIS — Z3202 Encounter for pregnancy test, result negative: Secondary | ICD-10-CM | POA: Insufficient documentation

## 2021-08-22 LAB — POCT PREGNANCY, URINE: Preg Test, Ur: NEGATIVE

## 2021-08-22 NOTE — Discharge Instructions (Signed)
[facility] Providers   [facility]             [address], [place], [place] [address] [phone]  [facility]                                                             [address], [address], [place], [place], [address] [phone]  [facility]                                    [address], [address], [place], [place], [address] [phone]  [facility] [place] [address], [address], [place], [place], [address] [phone]  [facility]                                 [address], [place], [place], [address] [phone]  [facility]                                    [address], [place], [place], [address] [phone]  [facility] [address], [address], [place], [place], [address]                              [facility] [address], [address], [place], [place], [address] [phone]  [facility]         Phone: [phone]  [facility]      Phone: [phone]   [facility]      Phone: [phone]  [facility]         Phone: [phone]   [facility]    Phone: [phone]  [facility]      Phone: [phone]  [facility]     Phone: [phone]  [facility]        Phone: [phone]  [facility]      Phone: [phone]  

## 2021-08-22 NOTE — MAU Provider Note (Signed)
None  ?  ?S ?Ms. SHONIA SKILLING is a 36 y.o. No obstetric history on file. patient who presents to MAU today with complaint of intermittent mid-lower abdominal pain and pressure for 2 days. She also reports ongoing lower back pain. Sitting with legs wide open and squatting helps improve pain. She denies dysuria, hematuria, or urgency, but reports some increase in frequency. Reports some nausea and occasional vomiting, but denies fever. Has a history of irregular periods, LMP was 04/19/2021. She has not taken a pregnancy test at home. She reports she has a PCP, but does not have an OBGYN. ? ?O ?BP 124/75   Pulse 73   Temp 98.6 ?F (37 ?C)   Resp 18  ?Physical Exam ?Vitals and nursing note reviewed.  ?Constitutional:   ?   General: She is not in acute distress. ?   Appearance: She is obese.  ?Cardiovascular:  ?   Rate and Rhythm: Normal rate.  ?Pulmonary:  ?   Effort: Pulmonary effort is normal.  ?Abdominal:  ?   Palpations: Abdomen is soft.  ?   Tenderness: There is no abdominal tenderness. There is no guarding.  ?Skin: ?   General: Skin is warm and dry.  ?Neurological:  ?   General: No focal deficit present.  ?   Mental Status: She is alert and oriented to person, place, and time.  ?Psychiatric:     ?   Mood and Affect: Mood normal.     ?   Behavior: Behavior normal.  ? ? ?A ?Medical screening exam complete ?Negative pregnancy test ? ?P ?Discharge from MAU in stable condition ?Patient given the option of transfer to [facility] for further evaluation or seek care in outpatient facility of choice. Patient desires to follow up with her PCP. She is requesting list of OBGYN's to establish care, which I provided. ?Warning signs for worsening condition that would warrant emergency follow-up discussed ?Patient may return to MAU as needed for OB concerns ? ? ? ?Brand Males, CNM ?08/22/2021 11:49 AM  ? ?

## 2021-08-22 NOTE — MAU Note (Signed)
.  Patricia Drake is a 36 y.o. at Unknown here in MAU reporting: Pt reports she has had abd pain and pressure x 2 days. Has had a period 2 times a month up until November. No period in December or January. Had some spotting last week . Went to ED yesterday but did not stay. Pain Is mostly in her right med lower quadrant. Pain comes an goes.  ?LMP: 04/19/2021 ?Onset of complaint: 2 days ?Pain score: 8/10 ?Vitals:  ? 08/22/21 1117  ?BP: 124/75  ?Pulse: 73  ?Resp: 18  ?Temp: 98.6 ?F (37 ?C)  ?   ?FHT:n/a ?Lab orders placed from triage:    ?

## 2021-09-02 ENCOUNTER — Encounter (HOSPITAL_COMMUNITY): Payer: Self-pay

## 2021-09-02 ENCOUNTER — Emergency Department (HOSPITAL_COMMUNITY)
Admission: EM | Admit: 2021-09-02 | Discharge: 2021-09-02 | Discharge status: Against Medical Advice | Payer: 59 | Attending: Emergency Medicine | Admitting: Emergency Medicine

## 2021-09-02 ENCOUNTER — Other Ambulatory Visit: Payer: Self-pay

## 2021-09-02 DIAGNOSIS — Z5321 Procedure and treatment not carried out due to patient leaving prior to being seen by health care provider: Secondary | ICD-10-CM | POA: Insufficient documentation

## 2021-09-02 DIAGNOSIS — R109 Unspecified abdominal pain: Secondary | ICD-10-CM | POA: Insufficient documentation

## 2021-09-02 LAB — COMPREHENSIVE METABOLIC PANEL
ALT: 15 U/L (ref 0–44)
AST: 20 U/L (ref 15–41)
Albumin: 3.8 g/dL (ref 3.5–5.0)
Alkaline Phosphatase: 60 U/L (ref 38–126)
Anion gap: 8 (ref 5–15)
BUN: 9 mg/dL (ref 6–20)
CO2: 24 mmol/L (ref 22–32)
Calcium: 8.7 mg/dL — ABNORMAL LOW (ref 8.9–10.3)
Chloride: 103 mmol/L (ref 98–111)
Creatinine, Ser: 0.67 mg/dL (ref 0.44–1.00)
GFR, Estimated: >60 mL/min (ref >60)
Glucose, Bld: 109 mg/dL — ABNORMAL HIGH (ref 70–99)
Potassium: 3.6 mmol/L (ref 3.5–5.1)
Sodium: 135 mmol/L (ref 135–145)
Total Bilirubin: 0.4 mg/dL (ref 0.3–1.2)
Total Protein: 7.3 g/dL (ref 6.5–8.1)

## 2021-09-02 LAB — URINALYSIS, ROUTINE W REFLEX MICROSCOPIC
Bacteria, UA: NONE SEEN
Bilirubin Urine: NEGATIVE
Glucose, UA: NEGATIVE mg/dL
Hgb urine dipstick: NEGATIVE
Ketones, ur: NEGATIVE mg/dL
Nitrite: NEGATIVE
Protein, ur: NEGATIVE mg/dL
Specific Gravity, Urine: 1.027 (ref 1.005–1.030)
pH: 5 (ref 5.0–8.0)

## 2021-09-02 LAB — CBC WITH DIFFERENTIAL/PLATELET
Abs Immature Granulocytes: 0.03 10*3/uL (ref 0.00–0.07)
Basophils Absolute: 0.1 10*3/uL (ref 0.0–0.1)
Basophils Relative: 1 %
Eosinophils Absolute: 0.5 10*3/uL (ref 0.0–0.5)
Eosinophils Relative: 5 %
HCT: 40.2 % (ref 36.0–46.0)
Hemoglobin: 13.7 g/dL (ref 12.0–15.0)
Immature Granulocytes: 0 %
Lymphocytes Relative: 31 %
Lymphs Abs: 3.1 10*3/uL (ref 0.7–4.0)
MCH: 31.6 pg (ref 26.0–34.0)
MCHC: 34.1 g/dL (ref 30.0–36.0)
MCV: 92.8 fL (ref 80.0–100.0)
Monocytes Absolute: 0.6 10*3/uL (ref 0.1–1.0)
Monocytes Relative: 5 %
Neutro Abs: 5.9 10*3/uL (ref 1.7–7.7)
Neutrophils Relative %: 58 %
Platelets: 405 10*3/uL — ABNORMAL HIGH (ref 150–400)
RBC: 4.33 MIL/uL (ref 3.87–5.11)
RDW: 13.2 % (ref 11.5–15.5)
WBC: 10.3 10*3/uL (ref 4.0–10.5)
nRBC: 0 % (ref 0.0–0.2)

## 2021-09-02 LAB — LIPASE, BLOOD: Lipase: 27 U/L (ref 11–51)

## 2021-09-02 LAB — I-STAT BETA HCG BLOOD, ED (MC, WL, AP ONLY): I-stat hCG, quantitative: <5 m[IU]/mL (ref <5)

## 2021-09-02 MED ORDER — ACETAMINOPHEN 325 MG PO TABS
650.0000 mg | ORAL_TABLET | Freq: Four times a day (QID) | ORAL | Status: DC | PRN
  Administered 2021-09-02: 650 mg via ORAL
  Filled 2021-09-02: qty 2
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
## 2021-09-02 NOTE — ED Notes (Signed)
Pt called for room x3 with no response 

## 2021-09-02 NOTE — ED Triage Notes (Signed)
Pt arrived via POV from home for c/o 10/10 right lower back that radiates into right lower abdomen. Pt states the lower back pain is stabbing in nature and the abdominal pain is cramping in naturex4-4mos. Pt states the pain is getting worse each day. Pt c/o vomitingx2 mos. ?

## 2021-09-10 ENCOUNTER — Other Ambulatory Visit: Payer: Self-pay

## 2021-09-10 ENCOUNTER — Encounter (HOSPITAL_COMMUNITY): Payer: Self-pay | Admitting: Emergency Medicine

## 2021-09-10 ENCOUNTER — Emergency Department (HOSPITAL_COMMUNITY): Payer: 59

## 2021-09-10 ENCOUNTER — Emergency Department (HOSPITAL_COMMUNITY)
Admission: EM | Admit: 2021-09-10 | Discharge: 2021-09-10 | Disposition: A | Discharge status: Home / Self Care | Payer: 59 | Attending: Emergency Medicine | Admitting: Emergency Medicine

## 2021-09-10 DIAGNOSIS — K5790 Diverticulosis of intestine, part unspecified, without perforation or abscess without bleeding: Secondary | ICD-10-CM

## 2021-09-10 DIAGNOSIS — R112 Nausea with vomiting, unspecified: Secondary | ICD-10-CM | POA: Diagnosis present

## 2021-09-10 DIAGNOSIS — M545 Low back pain, unspecified: Secondary | ICD-10-CM | POA: Diagnosis not present

## 2021-09-10 DIAGNOSIS — K5730 Diverticulosis of large intestine without perforation or abscess without bleeding: Secondary | ICD-10-CM | POA: Diagnosis not present

## 2021-09-10 DIAGNOSIS — R109 Unspecified abdominal pain: Secondary | ICD-10-CM

## 2021-09-10 LAB — CBC WITH DIFFERENTIAL/PLATELET
Abs Immature Granulocytes: 0.02 10*3/uL (ref 0.00–0.07)
Basophils Absolute: 0.1 10*3/uL (ref 0.0–0.1)
Basophils Relative: 1 %
Eosinophils Absolute: 0.7 10*3/uL — ABNORMAL HIGH (ref 0.0–0.5)
Eosinophils Relative: 8 %
HCT: 39.8 % (ref 36.0–46.0)
Hemoglobin: 13.4 g/dL (ref 12.0–15.0)
Immature Granulocytes: 0 %
Lymphocytes Relative: 32 %
Lymphs Abs: 3 10*3/uL (ref 0.7–4.0)
MCH: 31.5 pg (ref 26.0–34.0)
MCHC: 33.7 g/dL (ref 30.0–36.0)
MCV: 93.4 fL (ref 80.0–100.0)
Monocytes Absolute: 0.7 10*3/uL (ref 0.1–1.0)
Monocytes Relative: 8 %
Neutro Abs: 4.7 10*3/uL (ref 1.7–7.7)
Neutrophils Relative %: 51 %
Platelets: 393 10*3/uL (ref 150–400)
RBC: 4.26 MIL/uL (ref 3.87–5.11)
RDW: 13.3 % (ref 11.5–15.5)
WBC: 9.2 10*3/uL (ref 4.0–10.5)
nRBC: 0 % (ref 0.0–0.2)

## 2021-09-10 LAB — URINALYSIS, ROUTINE W REFLEX MICROSCOPIC
Bacteria, UA: NONE SEEN
Bilirubin Urine: NEGATIVE
Glucose, UA: NEGATIVE mg/dL
Ketones, ur: 5 mg/dL — AB
Leukocytes,Ua: NEGATIVE
Nitrite: NEGATIVE
Protein, ur: NEGATIVE mg/dL
Specific Gravity, Urine: 1.031 — ABNORMAL HIGH (ref 1.005–1.030)
pH: 5 (ref 5.0–8.0)

## 2021-09-10 LAB — COMPREHENSIVE METABOLIC PANEL
ALT: 20 U/L (ref 0–44)
AST: 21 U/L (ref 15–41)
Albumin: 3.9 g/dL (ref 3.5–5.0)
Alkaline Phosphatase: 69 U/L (ref 38–126)
Anion gap: 7 (ref 5–15)
BUN: 14 mg/dL (ref 6–20)
CO2: 24 mmol/L (ref 22–32)
Calcium: 8.6 mg/dL — ABNORMAL LOW (ref 8.9–10.3)
Chloride: 107 mmol/L (ref 98–111)
Creatinine, Ser: 0.68 mg/dL (ref 0.44–1.00)
GFR, Estimated: >60 mL/min (ref >60)
Glucose, Bld: 100 mg/dL — ABNORMAL HIGH (ref 70–99)
Potassium: 3.9 mmol/L (ref 3.5–5.1)
Sodium: 138 mmol/L (ref 135–145)
Total Bilirubin: 0.2 mg/dL — ABNORMAL LOW (ref 0.3–1.2)
Total Protein: 7 g/dL (ref 6.5–8.1)

## 2021-09-10 LAB — LIPASE, BLOOD: Lipase: 32 U/L (ref 11–51)

## 2021-09-10 LAB — PREGNANCY, URINE: Preg Test, Ur: NEGATIVE

## 2021-09-10 MED ORDER — ONDANSETRON 8 MG PO TBDP
8.0000 mg | ORAL_TABLET | Freq: Once | ORAL | Status: AC
  Administered 2021-09-10: 8 mg via ORAL
  Filled 2021-09-10: qty 1

## 2021-09-10 MED ORDER — ONDANSETRON 4 MG PO TBDP: 4.0000 mg | ORAL_TABLET | Freq: Three times a day (TID) | ORAL | 0 refills | Status: DC | PRN

## 2021-09-10 MED ORDER — OXYCODONE-ACETAMINOPHEN 5-325 MG PO TABS
1.0000 | ORAL_TABLET | Freq: Once | ORAL | Status: AC
  Administered 2021-09-10: 1 via ORAL
  Filled 2021-09-10: qty 1

## 2021-09-10 MED ORDER — IOHEXOL 300 MG/ML  SOLN
100.0000 mL | Freq: Once | INTRAMUSCULAR | Status: AC | PRN
  Administered 2021-09-10: 100 mL via INTRAVENOUS

## 2021-09-10 MED ORDER — FENTANYL CITRATE PF 50 MCG/ML IJ SOSY
50.0000 ug | PREFILLED_SYRINGE | Freq: Once | INTRAMUSCULAR | Status: AC
  Administered 2021-09-10: 50 ug via INTRAVENOUS
  Filled 2021-09-10: qty 1

## 2021-09-10 NOTE — ED Provider Triage Note (Signed)
Emergency Medicine Provider Triage Evaluation Note ? ?Patricia Drake , a 36 y.o. female  was evaluated in triage.  Pt complains of right-sided flank and abdominal pain that has been worsening for 1 week.  Patient also endorses nausea and vomiting.  Denies constipation or diarrhea.  Denies chest pain or shortness of breath.  Denies dysuria ? ?Review of Systems  ?Positive: Abdominal pain, flank pain, nausea, vomiting ?Negative: Dysuria, chest pain, shortness of breath ? ?Physical Exam  ?BP 131/85 (BP Location: Left Arm)   Pulse 96   Temp 98.5 ?F (36.9 ?C) (Oral)   Resp 18   SpO2 98%  ?Gen:   Awake, no distress   ?Resp:  Normal effort  ?MSK:   Moves extremities without difficulty  ?Other:   ? ?Medical Decision Making  ?Medically screening exam initiated at 4:01 PM.  Appropriate orders placed.  Patricia Drake was informed that the remainder of the evaluation will be completed by another provider, this initial triage assessment does not replace that evaluation, and the importance of remaining in the ED until their evaluation is complete. ? ? ?  ?Dorothyann Peng, PA-C ?09/10/21 1602 ? ?

## 2021-09-10 NOTE — ED Triage Notes (Signed)
Per patient, states right back/flank pain radiating to abdomin-no dysuria-states bloating and abdominal distention  ?

## 2021-09-10 NOTE — Discharge Instructions (Addendum)
Please refer to the attached instructions. Take zofran as directed for nausea or vomiting. Tylenol or ibuprofen for pain. Follow-up with gastroenterology--your primary care provider can help facilitate the appointment. ?

## 2021-09-10 NOTE — ED Notes (Signed)
Lab work unsuccessful x2 ?

## 2021-09-10 NOTE — ED Provider Notes (Signed)
?[facility] ?Provider Note ? ? ?CSN: [id number] ?Arrival date & time: 09/10/21  1547 ? ?  ? ?History ? ?Chief Complaint  ?Patient presents with  ? Back Pain  ? ? ?Patricia [facility] is a 36 y.o. female. ? ?Patient presents for evaluation of low back pain with radiation to right abdomen and pelvis. Pain ongoing for several weeks. Irregular periods. Evaluated at [facility] on 3/10, not pregnant. Recommendation for ED or PCP follow-up, patient requested OB/GYN resources. Patient presented to ED on 09/02/21, left before evaluation complete. Today she endorses nausea with vomiting x 2 days, along with abdominal bloating. She denies dysuria or vaginal discharge. No history of renal stones. ? ?The history is provided by the patient and medical records. No language interpreter was used.  ?Back Pain ?Location:  Lumbar spine ?Quality:  Stabbing ?Radiates to: right abdomen and pelvis. ?Pain severity now: moderate to severe. ?Duration:  3 weeks ?Timing:  Intermittent ?Progression:  Worsening ?Associated symptoms: abdominal pain and abdominal swelling   ?Associated symptoms: no bladder incontinence, no bowel incontinence, no dysuria, no fever and no pelvic pain   ? ?  ? ?Home Medications ?Prior to Admission medications   ?Medication Sig Start Date End Date Taking? Authorizing Provider  ?albuterol (VENTOLIN HFA) 108 (90 Base) MCG/ACT inhaler Inhale 2 puffs into the lungs 4 (four) times daily as needed for wheezing or shortness of breath. 01/28/21   [provider]  ?cyclobenzaprine (FLEXERIL) 10 MG tablet Take 10 mg by mouth at bedtime.    [provider]  ?divalproex (DEPAKOTE) 500 MG DR tablet Take 1 tablet (500 mg total) by mouth every 12 (twelve) hours. 07/24/21 08/23/21  Briant Cedar, MD  ?fluticasone-salmeterol (ADVAIR) 100-50 MCG/ACT AEPB Inhale 1 puff into the lungs 2 (two) times daily.    [provider]  ?ibuprofen (ADVIL) 800 MG tablet Take 1 tablet (800 mg total)  by mouth every 8 (eight) hours as needed for moderate pain. 08/02/21   Milton Ferguson, MD  ?levETIRAcetam (KEPPRA) 750 MG tablet Take 1 tablet (750 mg total) by mouth 2 (two) times daily. 07/20/21   Isla Pence, MD  ?mirtazapine (REMERON) 15 MG tablet Take 15 mg by mouth at bedtime.    [provider]  ?omeprazole (PRILOSEC) 20 MG capsule Take 20 mg by mouth daily. 01/28/21   [provider]  ?prazosin (MINIPRESS) 1 MG capsule Take 1 mg by mouth at bedtime.    [provider]  ?phenytoin (DILANTIN) 100 MG ER capsule Take 1 capsule (100 mg total) by mouth 3 (three) times daily. ?Patient not taking: Reported on 09/27/2019 [id number]  Delora Fuel, MD  ?   ? ?Allergies    ?Sulfa antibiotics   ? ?Review of Systems   ?Review of Systems  ?Constitutional:  Negative for fever.  ?Gastrointestinal:  Positive for abdominal pain. Negative for bowel incontinence.  ?Genitourinary:  Negative for bladder incontinence, dysuria and pelvic pain.  ?Musculoskeletal:  Positive for back pain.  ?All other systems reviewed and are negative. ? ?Physical Exam ?Updated Vital Signs ?BP 115/82 (BP Location: Right Arm)   Pulse 80   Temp 98.5 ?F (36.9 ?C) (Oral)   Resp 16   SpO2 100%  ?Physical Exam ?Vitals and nursing note reviewed.  ?Constitutional:   ?   Appearance: Normal appearance. She is obese.  ?HENT:  ?   Head: Normocephalic.  ?Eyes:  ?   Conjunctiva/sclera: Conjunctivae normal.  ?Cardiovascular:  ?   Rate and Rhythm:  Normal rate.  ?Pulmonary:  ?   Effort: Pulmonary effort is normal.  ?Abdominal:  ?   General: There is distension.  ?   Palpations: Abdomen is soft. There is no mass.  ?   Tenderness: There is no right CVA tenderness, left CVA tenderness or guarding.  ?Musculoskeletal:     ?   General: Normal range of motion.  ?Skin: ?   General: Skin is warm and dry.  ?Neurological:  ?   Mental Status: She is alert and oriented to person, place, and time.  ?Psychiatric:     ?   Mood and Affect: Mood  normal.     ?   Behavior: Behavior normal.  ? ? ?ED Results / Procedures / Treatments   ?Labs ?(all labs ordered are listed, but only abnormal results are displayed) ?Labs Reviewed  ?CBC WITH DIFFERENTIAL/PLATELET - Abnormal; Notable for the following components:  ?    Result Value  ? Eosinophils Absolute 0.7 (*)   ? All other components within normal limits  ?COMPREHENSIVE METABOLIC PANEL - Abnormal; Notable for the following components:  ? Glucose, Bld 100 (*)   ? Calcium 8.6 (*)   ? Total Bilirubin 0.2 (*)   ? All other components within normal limits  ?URINALYSIS, ROUTINE W REFLEX MICROSCOPIC - Abnormal; Notable for the following components:  ? Specific Gravity, Urine 1.031 (*)   ? Hgb urine dipstick MODERATE (*)   ? Ketones, ur 5 (*)   ? All other components within normal limits  ?LIPASE, BLOOD  ?PREGNANCY, URINE  ? ? ?EKG ?None ? ?Radiology ?CT ABDOMEN PELVIS W CONTRAST ? ?Result Date: 09/10/2021 ?CLINICAL DATA:  Abdominal pain. EXAM: CT ABDOMEN AND PELVIS WITH CONTRAST TECHNIQUE: Multidetector CT imaging of the abdomen and pelvis was performed using the standard protocol following bolus administration of intravenous contrast. RADIATION DOSE REDUCTION: This exam was performed according to the departmental dose-optimization program which includes automated exposure control, adjustment of the mA and/or kV according to patient size and/or use of iterative reconstruction technique. CONTRAST:  164mL OMNIPAQUE IOHEXOL 300 MG/ML  SOLN COMPARISON:  July 02, 2013 FINDINGS: Lower chest: A stable 13 mm pleural based noncalcified lung nodule and adjacent scarring is seen within the lateral aspect of the right lung base. Multiple small radiopaque foreign bodies are again seen within the anterior and lateral lower right chest wall. Hepatobiliary: No focal liver abnormality is seen. Multiple small metallic density foreign bodies are seen within the right lobe of the liver. No gallstones, gallbladder wall thickening, or  biliary dilatation. Pancreas: Unremarkable. No pancreatic ductal dilatation or surrounding inflammatory changes. Spleen: Normal in size without focal abnormality. Adrenals/Urinary Tract: Adrenal glands are unremarkable. Kidneys are normal, without renal calculi, focal lesion, or hydronephrosis. Bladder is unremarkable. Stomach/Bowel: Stomach is within normal limits. Appendix appears normal. No evidence of bowel wall thickening, distention, or inflammatory changes. Noninflamed diverticula are seen throughout the sigmoid colon. Vascular/Lymphatic: No significant vascular findings are present. No enlarged abdominal or pelvic lymph nodes. Reproductive: Status post hysterectomy. No adnexal masses. Bilateral tubal ligation clips are seen. Other: No abdominal wall hernia or abnormality. No abdominopelvic ascites. Musculoskeletal: No acute or significant osseous findings. IMPRESSION: Sigmoid diverticulosis. Electronically Signed   By: Virgina Norfolk M.D.   On: 09/10/2021 19:55   ? ?Procedures ?Procedures  ? ? ?Medications Ordered in ED ?Medications  ?oxyCODONE-acetaminophen (PERCOCET/ROXICET) 5-325 MG per tablet 1 tablet (has no administration in time range)  ?ondansetron (ZOFRAN-ODT) disintegrating tablet 8 mg (8 mg Oral Given  09/10/21 1851)  ?fentaNYL (SUBLIMAZE) injection 50 mcg (50 mcg Intravenous Given 09/10/21 1952)  ?iohexol (OMNIPAQUE) 300 MG/ML solution 100 mL (100 mLs Intravenous Contrast Given 09/10/21 1939)  ? ? ?ED Course/ Medical Decision Making/ A&P ?  ?                        ?Medical Decision Making ?Amount and/or Complexity of Data Reviewed ?Labs: ordered. ?Radiology: ordered. ? ?Risk ?Prescription drug management. ? ? ?Patient is nontoxic, nonseptic appearing, in no apparent distress.  Patient's pain and other symptoms adequately managed in emergency department.  Labs, imaging and vitals reviewed.  Patient does not meet the SIRS or Sepsis criteria.  On repeat exam patient does not have a surgical abdomen  and there are no peritoneal signs.  No indication of appendicitis, bowel obstruction, bowel perforation, cholecystitis, diverticulitis, or pregnancy.  Patient discharged home with symptomatic treatment and given s

## 2021-09-12 ENCOUNTER — Emergency Department (HOSPITAL_COMMUNITY)
Admission: EM | Admit: 2021-09-12 | Discharge: 2021-09-12 | Disposition: A | Discharge status: Home / Self Care | Payer: 59 | Attending: Emergency Medicine | Admitting: Emergency Medicine

## 2021-09-12 ENCOUNTER — Emergency Department (HOSPITAL_COMMUNITY): Payer: 59

## 2021-09-12 ENCOUNTER — Other Ambulatory Visit: Payer: Self-pay

## 2021-09-12 ENCOUNTER — Encounter (HOSPITAL_COMMUNITY): Payer: Self-pay

## 2021-09-12 DIAGNOSIS — G8929 Other chronic pain: Secondary | ICD-10-CM

## 2021-09-12 DIAGNOSIS — R112 Nausea with vomiting, unspecified: Secondary | ICD-10-CM | POA: Diagnosis not present

## 2021-09-12 DIAGNOSIS — R1084 Generalized abdominal pain: Secondary | ICD-10-CM | POA: Diagnosis not present

## 2021-09-12 DIAGNOSIS — N9489 Other specified conditions associated with female genital organs and menstrual cycle: Secondary | ICD-10-CM | POA: Insufficient documentation

## 2021-09-12 DIAGNOSIS — M545 Low back pain, unspecified: Secondary | ICD-10-CM | POA: Insufficient documentation

## 2021-09-12 DIAGNOSIS — R109 Unspecified abdominal pain: Secondary | ICD-10-CM | POA: Diagnosis present

## 2021-09-12 LAB — URINALYSIS, ROUTINE W REFLEX MICROSCOPIC
Bacteria, UA: NONE SEEN
Bilirubin Urine: NEGATIVE
Glucose, UA: NEGATIVE mg/dL
Hgb urine dipstick: NEGATIVE
Ketones, ur: NEGATIVE mg/dL
Nitrite: NEGATIVE
Protein, ur: NEGATIVE mg/dL
Specific Gravity, Urine: 1.023 (ref 1.005–1.030)
pH: 6 (ref 5.0–8.0)

## 2021-09-12 LAB — CBC
HCT: 39 % (ref 36.0–46.0)
Hemoglobin: 13.1 g/dL (ref 12.0–15.0)
MCH: 31.4 pg (ref 26.0–34.0)
MCHC: 33.6 g/dL (ref 30.0–36.0)
MCV: 93.5 fL (ref 80.0–100.0)
Platelets: 403 10*3/uL — ABNORMAL HIGH (ref 150–400)
RBC: 4.17 MIL/uL (ref 3.87–5.11)
RDW: 13 % (ref 11.5–15.5)
WBC: 8.7 10*3/uL (ref 4.0–10.5)
nRBC: 0 % (ref 0.0–0.2)

## 2021-09-12 LAB — COMPREHENSIVE METABOLIC PANEL
ALT: 16 U/L (ref 0–44)
AST: 24 U/L (ref 15–41)
Albumin: 3.6 g/dL (ref 3.5–5.0)
Alkaline Phosphatase: 71 U/L (ref 38–126)
Anion gap: 5 (ref 5–15)
BUN: 10 mg/dL (ref 6–20)
CO2: 26 mmol/L (ref 22–32)
Calcium: 8.7 mg/dL — ABNORMAL LOW (ref 8.9–10.3)
Chloride: 104 mmol/L (ref 98–111)
Creatinine, Ser: 0.63 mg/dL (ref 0.44–1.00)
GFR, Estimated: >60 mL/min (ref >60)
Glucose, Bld: 108 mg/dL — ABNORMAL HIGH (ref 70–99)
Potassium: 3.8 mmol/L (ref 3.5–5.1)
Sodium: 135 mmol/L (ref 135–145)
Total Bilirubin: 0.6 mg/dL (ref 0.3–1.2)
Total Protein: 7 g/dL (ref 6.5–8.1)

## 2021-09-12 LAB — I-STAT BETA HCG BLOOD, ED (MC, WL, AP ONLY): I-stat hCG, quantitative: <5 m[IU]/mL (ref <5)

## 2021-09-12 LAB — LIPASE, BLOOD: Lipase: 29 U/L (ref 11–51)

## 2021-09-12 MED ORDER — MORPHINE SULFATE (PF) 4 MG/ML IV SOLN
4.0000 mg | Freq: Once | INTRAVENOUS | Status: AC
  Administered 2021-09-12: 4 mg via INTRAVENOUS
  Filled 2021-09-12: qty 1

## 2021-09-12 MED ORDER — ONDANSETRON HCL 4 MG/2ML IJ SOLN
4.0000 mg | Freq: Once | INTRAMUSCULAR | Status: AC
  Administered 2021-09-12: 4 mg via INTRAVENOUS
  Filled 2021-09-12: qty 2

## 2021-09-12 MED ORDER — SODIUM CHLORIDE 0.9 % IV BOLUS
500.0000 mL | Freq: Once | INTRAVENOUS | Status: AC
  Administered 2021-09-12: 500 mL via INTRAVENOUS

## 2021-09-12 NOTE — ED Provider Notes (Signed)
?[facility]-EMERGENCY DEPT ?Provider Note ? ? ?CSN: [id number] ?Arrival date & time: 09/12/21  1315 ? ?  ? ?History ? ?Chief Complaint  ?Patient presents with  ? Abdominal Pain  ? Back Pain  ? ? ?Patricia Drake is a 36 y.o. female. ? ?HPI ? ? 36 year old female presents with chronic right sided abdominal and flank/back pain. Was seen previously for similar complaint. Work up including lab and CT imaging just a couple days ago was normal.  Patient was discharged for outpatient follow-up but returns today with ongoing discomfort.  Patient states that her abdomen is large/bloated like when she was pregnant.  However she is status post tubal and hysterectomy.  She states she has been having worsening nausea and intermittent vomiting.  Denies any fever.  No vaginal bleeding/diarrhea.  No other genitourinary symptoms. ? ?Home Medications ?Prior to Admission medications   ?Medication Sig Start Date End Date Taking? Authorizing Provider  ?albuterol (VENTOLIN HFA) 108 (90 Base) MCG/ACT inhaler Inhale 2 puffs into the lungs 4 (four) times daily as needed for wheezing or shortness of breath. 01/28/21   [provider]  ?cyclobenzaprine (FLEXERIL) 10 MG tablet Take 10 mg by mouth at bedtime.    [provider]  ?divalproex (DEPAKOTE) 500 MG DR tablet Take 1 tablet (500 mg total) by mouth every 12 (twelve) hours. 07/24/21 08/23/21  Lauro Franklin, MD  ?fluticasone-salmeterol (ADVAIR) 100-50 MCG/ACT AEPB Inhale 1 puff into the lungs 2 (two) times daily.    [provider]  ?ibuprofen (ADVIL) 800 MG tablet Take 1 tablet (800 mg total) by mouth every 8 (eight) hours as needed for moderate pain. 08/02/21   Bethann Berkshire, MD  ?levETIRAcetam (KEPPRA) 750 MG tablet Take 1 tablet (750 mg total) by mouth 2 (two) times daily. 07/20/21   Jacalyn Lefevre, MD  ?mirtazapine (REMERON) 15 MG tablet Take 15 mg by mouth at bedtime.    [provider]  ?omeprazole (PRILOSEC) 20 MG capsule  Take 20 mg by mouth daily. 01/28/21   [provider]  ?ondansetron (ZOFRAN-ODT) 4 MG disintegrating tablet Take 1 tablet (4 mg total) by mouth every 8 (eight) hours as needed for nausea. 09/10/21   Felicie Morn, NP  ?prazosin (MINIPRESS) 1 MG capsule Take 1 mg by mouth at bedtime.    [provider]  ?phenytoin (DILANTIN) 100 MG ER capsule Take 1 capsule (100 mg total) by mouth 3 (three) times daily. ?Patient not taking: Reported on 09/27/2019 01/01/19 09/28/19  Dione Booze, MD  ?   ? ?Allergies    ?Sulfa antibiotics   ? ?Review of Systems   ?Review of Systems  ?Constitutional:  Positive for fatigue. Negative for fever.  ?Respiratory:  Negative for shortness of breath.   ?Cardiovascular:  Negative for chest pain.  ?Gastrointestinal:  Positive for abdominal distention, abdominal pain and nausea. Negative for diarrhea and vomiting.  ?Musculoskeletal:  Positive for back pain.  ?Skin:  Negative for rash.  ?Neurological:  Negative for weakness, numbness and headaches.  ? ?Physical Exam ?Updated Vital Signs ?BP (!) 103/52   Pulse 69   Temp 98.4 ?F (36.9 ?C) (Oral)   Resp 18   Ht 5\' 2"  (1.575 m)   Wt 108.9 kg   LMP 07/29/2021 (Approximate)   SpO2 99%   BMI 43.90 kg/m?  ?Physical Exam ?Vitals and nursing note reviewed.  ?Constitutional:   ?   General: She is not in acute distress. ?   Appearance: Normal appearance. She  is not toxic-appearing.  ?HENT:  ?   Head: Normocephalic.  ?   Mouth/Throat:  ?   Mouth: Mucous membranes are moist.  ?Cardiovascular:  ?   Rate and Rhythm: Normal rate.  ?Pulmonary:  ?   Effort: Pulmonary effort is normal. No respiratory distress.  ?Abdominal:  ?   Palpations: Abdomen is soft.  ?   Tenderness: There is generalized abdominal tenderness. There is no guarding or rebound.  ?Musculoskeletal:  ?   Comments: Diffuse right lower back TTP without overlying skin changes  ?Skin: ?   General: Skin is warm.  ?Neurological:  ?   Mental Status: She is alert and oriented to person,  place, and time. Mental status is at baseline.  ?Psychiatric:     ?   Mood and Affect: Mood normal.  ? ? ?ED Results / Procedures / Treatments   ?Labs ?(all labs ordered are listed, but only abnormal results are displayed) ?Labs Reviewed  ?COMPREHENSIVE METABOLIC PANEL - Abnormal; Notable for the following components:  ?    Result Value  ? Glucose, Bld 108 (*)   ? Calcium 8.7 (*)   ? All other components within normal limits  ?CBC - Abnormal; Notable for the following components:  ? Platelets 403 (*)   ? All other components within normal limits  ?URINALYSIS, ROUTINE W REFLEX MICROSCOPIC - Abnormal; Notable for the following components:  ? APPearance CLOUDY (*)   ? Leukocytes,Ua SMALL (*)   ? All other components within normal limits  ?LIPASE, BLOOD  ?I-STAT BETA HCG BLOOD, ED ([facility], [facility], [facility] ONLY)  ? ? ?EKG ?None ? ?Radiology ?CT ABDOMEN PELVIS W CONTRAST ? ?Result Date: 09/10/2021 ?CLINICAL DATA:  Abdominal pain. EXAM: CT ABDOMEN AND PELVIS WITH CONTRAST TECHNIQUE: Multidetector CT imaging of the abdomen and pelvis was performed using the standard protocol following bolus administration of intravenous contrast. RADIATION DOSE REDUCTION: This exam was performed according to the departmental dose-optimization program which includes automated exposure control, adjustment of the mA and/or kV according to patient size and/or use of iterative reconstruction technique. CONTRAST:  100mL OMNIPAQUE IOHEXOL 300 MG/ML  SOLN COMPARISON:  July 02, 2013 FINDINGS: Lower chest: A stable 13 mm pleural based noncalcified lung nodule and adjacent scarring is seen within the lateral aspect of the right lung base. Multiple small radiopaque foreign bodies are again seen within the anterior and lateral lower right chest wall. Hepatobiliary: No focal liver abnormality is seen. Multiple small metallic density foreign bodies are seen within the right lobe of the liver. No gallstones, gallbladder wall thickening, or biliary dilatation.  Pancreas: Unremarkable. No pancreatic ductal dilatation or surrounding inflammatory changes. Spleen: Normal in size without focal abnormality. Adrenals/Urinary Tract: Adrenal glands are unremarkable. Kidneys are normal, without renal calculi, focal lesion, or hydronephrosis. Bladder is unremarkable. Stomach/Bowel: Stomach is within normal limits. Appendix appears normal. No evidence of bowel wall thickening, distention, or inflammatory changes. Noninflamed diverticula are seen throughout the sigmoid colon. Vascular/Lymphatic: No significant vascular findings are present. No enlarged abdominal or pelvic lymph nodes. Reproductive: Status post hysterectomy. No adnexal masses. Bilateral tubal ligation clips are seen. Other: No abdominal wall hernia or abnormality. No abdominopelvic ascites. Musculoskeletal: No acute or significant osseous findings. IMPRESSION: Sigmoid diverticulosis. Electronically Signed   By: Aram Candelahaddeus  Houston M.D.   On: 09/10/2021 19:55   ? ?Procedures ?Procedures  ? ? ?Medications Ordered in ED ?Medications  ?sodium chloride 0.9 % bolus 500 mL (has no administration in time range)  ?ondansetron (ZOFRAN) injection 4 mg (  has no administration in time range)  ?morphine (PF) 4 MG/ML injection 4 mg (has no administration in time range)  ? ? ?ED Course/ Medical Decision Making/ A&P ?  ?                        ?Medical Decision Making ?Amount and/or Complexity of Data Reviewed ?Labs: ordered. ?Radiology: ordered. ? ?Risk ?Prescription drug management. ? ? ?36 year old female presents emergency department with ongoing right-sided abdominal pain, flank pain and lower back pain.  Patient was seen 2 days ago with a similar complaint.  Had a full work-up, CT of the abdomen pelvis identified no acute finding.  Patient has had no outpatient follow-up for GI follow-up in regards to her complaints.  She feels as if her abdomen is very bloated, almost like she is pregnant.  She is status post  tubal/hysterectomy. ? ?Vital signs are normal.  Abdomen is large but not distended or peritonitic, soft and nontender.  Repeat blood work here is normal.  X-ray of the abdomen shows no concerning bowel gas pattern.  After a dose of IV medicat

## 2021-09-12 NOTE — ED Triage Notes (Signed)
Patient c/o RLQ pain, nausea, and right lower back pain that she describes as throbbing. Patient was seen on 09/10/21 for the same. Patient states the pain is worsening. ?

## 2021-09-12 NOTE — ED Notes (Signed)
Pt was given water for fluid challenge. 

## 2021-09-12 NOTE — Discharge Instructions (Addendum)
You have been seen and discharged from the emergency department.  Your x-ray showed no signs of obstruction or abnormality.  The CT that was done a couple days ago showed no acute finding.  I believe we need to follow-up and establish care with gastroenterology for further evaluation.  Your blood work today was normal.  Follow-up with your primary provider for further evaluation and further care. Take home medications as prescribed. If you have any worsening symptoms or further concerns for your health please return to an emergency department for further evaluation. ?

## 2021-09-14 ENCOUNTER — Encounter (HOSPITAL_COMMUNITY): Payer: Self-pay

## 2021-09-14 ENCOUNTER — Emergency Department (HOSPITAL_COMMUNITY)
Admission: EM | Admit: 2021-09-14 | Discharge: 2021-09-14 | Disposition: A | Discharge status: Home / Self Care | Payer: 59 | Attending: Emergency Medicine | Admitting: Emergency Medicine

## 2021-09-14 ENCOUNTER — Other Ambulatory Visit: Payer: Self-pay

## 2021-09-14 DIAGNOSIS — M545 Low back pain, unspecified: Secondary | ICD-10-CM | POA: Diagnosis not present

## 2021-09-14 DIAGNOSIS — R140 Abdominal distension (gaseous): Secondary | ICD-10-CM | POA: Diagnosis not present

## 2021-09-14 DIAGNOSIS — R112 Nausea with vomiting, unspecified: Secondary | ICD-10-CM | POA: Insufficient documentation

## 2021-09-14 DIAGNOSIS — M7918 Myalgia, other site: Secondary | ICD-10-CM | POA: Diagnosis not present

## 2021-09-14 DIAGNOSIS — R1031 Right lower quadrant pain: Secondary | ICD-10-CM | POA: Diagnosis present

## 2021-09-14 LAB — COMPREHENSIVE METABOLIC PANEL
ALT: 16 U/L (ref 0–44)
AST: 18 U/L (ref 15–41)
Albumin: 4 g/dL (ref 3.5–5.0)
Alkaline Phosphatase: 69 U/L (ref 38–126)
Anion gap: 5 (ref 5–15)
BUN: 14 mg/dL (ref 6–20)
CO2: 27 mmol/L (ref 22–32)
Calcium: 9 mg/dL (ref 8.9–10.3)
Chloride: 104 mmol/L (ref 98–111)
Creatinine, Ser: 0.61 mg/dL (ref 0.44–1.00)
GFR, Estimated: >60 mL/min (ref >60)
Glucose, Bld: 88 mg/dL (ref 70–99)
Potassium: 3.9 mmol/L (ref 3.5–5.1)
Sodium: 136 mmol/L (ref 135–145)
Total Bilirubin: 0.5 mg/dL (ref 0.3–1.2)
Total Protein: 7.7 g/dL (ref 6.5–8.1)

## 2021-09-14 LAB — URINALYSIS, ROUTINE W REFLEX MICROSCOPIC
Bilirubin Urine: NEGATIVE
Glucose, UA: NEGATIVE mg/dL
Hgb urine dipstick: NEGATIVE
Ketones, ur: 5 mg/dL — AB
Nitrite: NEGATIVE
Protein, ur: NEGATIVE mg/dL
Specific Gravity, Urine: 1.034 — ABNORMAL HIGH (ref 1.005–1.030)
pH: 5 (ref 5.0–8.0)

## 2021-09-14 LAB — CBC
HCT: 40.2 % (ref 36.0–46.0)
Hemoglobin: 13.6 g/dL (ref 12.0–15.0)
MCH: 31.5 pg (ref 26.0–34.0)
MCHC: 33.8 g/dL (ref 30.0–36.0)
MCV: 93.1 fL (ref 80.0–100.0)
Platelets: 398 10*3/uL (ref 150–400)
RBC: 4.32 MIL/uL (ref 3.87–5.11)
RDW: 13.1 % (ref 11.5–15.5)
WBC: 9.2 10*3/uL (ref 4.0–10.5)
nRBC: 0 % (ref 0.0–0.2)

## 2021-09-14 LAB — I-STAT BETA HCG BLOOD, ED (MC, WL, AP ONLY): I-stat hCG, quantitative: <5 m[IU]/mL (ref <5)

## 2021-09-14 LAB — LIPASE, BLOOD: Lipase: 27 U/L (ref 11–51)

## 2021-09-14 MED ORDER — HYDROCODONE-ACETAMINOPHEN 5-325 MG PO TABS: 1.0000 | ORAL_TABLET | ORAL | 0 refills | Status: DC | PRN

## 2021-09-14 MED ORDER — OXYCODONE-ACETAMINOPHEN 5-325 MG PO TABS
1.0000 | ORAL_TABLET | Freq: Once | ORAL | Status: AC
  Administered 2021-09-14: 1 via ORAL
  Filled 2021-09-14: qty 1

## 2021-09-14 NOTE — Discharge Instructions (Signed)
There are no signs of serious problems at this time.  Use a heating pad on the sore area of your back, 3-4 times a day.  We are giving you a prescription pain reliever, narcotic.  Do not drink alcohol or drive when taking it.  Follow-up with your doctor as needed for problems. ?

## 2021-09-14 NOTE — ED Triage Notes (Signed)
Patient c/o upper abdominal pain, N/V,and a headache x 4 days. ? ?Patient also reports a history of sciatica and c/o right lower back pain that radiates into the right buttocks and radiates into the right groin. ?

## 2021-09-14 NOTE — ED Provider Notes (Signed)
?[facility] DEPT ?Provider Note ? ? ?CSN: [id number] ?Arrival date & time: 09/14/21  1717 ? ?  ? ?History ? ?Chief Complaint  ?Patient presents with  ? Abdominal Pain  ? Back Pain  ? ? ?Patricia Drake is a 36 y.o. female. ? ?HPI ?She presents for evaluation of chronic back pain, which is now rating to the right lower quadrant, present for several months and worsening despite using ibuprofen for pain.  She was evaluated in the ED on 3/20 9/23 and 09/12/2021.  She has had comprehensive evaluation.  No specific diagnostic entity was found.  She had previous MRI imaging of the lumbar spine showing mild degenerative disc disease.  This was done October 2022.  She is also concerned that her abdomen is distended.  She has had some nausea and vomiting last few days but no diarrhea or constipation.  She denies fever or chills ?  ? ?Home Medications ?Prior to Admission medications   ?Medication Sig Start Date End Date Taking? Authorizing Provider  ?HYDROcodone-acetaminophen (NORCO/VICODIN) 5-325 MG tablet Take 1 tablet by mouth every 4 (four) hours as needed. 09/14/21  Yes Daleen Bo, MD  ?albuterol (VENTOLIN HFA) 108 (90 Base) MCG/ACT inhaler Inhale 2 puffs into the lungs 4 (four) times daily as needed for wheezing or shortness of breath. 01/28/21   [provider]  ?cyclobenzaprine (FLEXERIL) 10 MG tablet Take 10 mg by mouth at bedtime.    [provider]  ?divalproex (DEPAKOTE) 500 MG DR tablet Take 1 tablet (500 mg total) by mouth every 12 (twelve) hours. 07/24/21 08/23/21  Briant Cedar, MD  ?fluticasone-salmeterol (ADVAIR) 100-50 MCG/ACT AEPB Inhale 1 puff into the lungs 2 (two) times daily.    [provider]  ?ibuprofen (ADVIL) 800 MG tablet Take 1 tablet (800 mg total) by mouth every 8 (eight) hours as needed for moderate pain. 08/02/21   Milton Ferguson, MD  ?levETIRAcetam (KEPPRA) 750 MG tablet Take 1 tablet (750 mg total) by mouth 2 (two) times daily.  07/20/21   Isla Pence, MD  ?mirtazapine (REMERON) 15 MG tablet Take 15 mg by mouth at bedtime.    [provider]  ?omeprazole (PRILOSEC) 20 MG capsule Take 20 mg by mouth daily. 01/28/21   [provider]  ?ondansetron (ZOFRAN-ODT) 4 MG disintegrating tablet Take 1 tablet (4 mg total) by mouth every 8 (eight) hours as needed for nausea. 09/10/21   Etta Quill, NP  ?prazosin (MINIPRESS) 1 MG capsule Take 1 mg by mouth at bedtime.    [provider]  ?phenytoin (DILANTIN) 100 MG ER capsule Take 1 capsule (100 mg total) by mouth 3 (three) times daily. ?Patient not taking: Reported on 09/27/2019 [id number]  Delora Fuel, MD  ?   ? ?Allergies    ?Sulfa antibiotics   ? ?Review of Systems   ?Review of Systems ? ?Physical Exam ?Updated Vital Signs ?BP 107/60   Pulse 82   Temp 99.1 ?F (37.3 ?C) (Oral)   Resp 18   Ht 5\' 2"  (1.575 m)   Wt 108.9 kg   SpO2 95%   BMI 43.90 kg/m?  ?Physical Exam ?Vitals and nursing note reviewed.  ?Constitutional:   ?   General: She is not in acute distress. ?   Appearance: She is well-developed. She is obese. She is not ill-appearing or diaphoretic.  ?HENT:  ?   Head: Normocephalic and atraumatic.  ?   Right Ear: External ear normal.  ?   Left Ear:  External ear normal.  ?Eyes:  ?   Conjunctiva/sclera: Conjunctivae normal.  ?   Pupils: Pupils are equal, round, and reactive to light.  ?Neck:  ?   Trachea: Phonation normal.  ?Cardiovascular:  ?   Rate and Rhythm: Normal rate and regular rhythm.  ?   Heart sounds: Normal heart sounds.  ?Pulmonary:  ?   Effort: Pulmonary effort is normal.  ?   Breath sounds: Normal breath sounds.  ?Abdominal:  ?   General: There is no distension.  ?   Palpations: Abdomen is soft.  ?   Tenderness: There is no abdominal tenderness.  ?Musculoskeletal:     ?   General: Normal range of motion.  ?   Cervical back: Normal range of motion and neck supple.  ?   Comments: Right lower back, lower lumbar region, exquisitely tender to light  touch without palpable or visible skin injury in this area.  Patient started crying when I palpated this area.  ?Skin: ?   General: Skin is warm and dry.  ?Neurological:  ?   Mental Status: She is alert and oriented to person, place, and time.  ?   Cranial Nerves: No cranial nerve deficit.  ?   Sensory: No sensory deficit.  ?   Motor: No abnormal muscle tone.  ?   Coordination: Coordination normal.  ?Psychiatric:     ?   Mood and Affect: Mood normal.     ?   Behavior: Behavior normal.     ?   Thought Content: Thought content normal.     ?   Judgment: Judgment normal.  ? ? ?ED Results / Procedures / Treatments   ?Labs ?(all labs ordered are listed, but only abnormal results are displayed) ?Labs Reviewed  ?URINALYSIS, ROUTINE W REFLEX MICROSCOPIC - Abnormal; Notable for the following components:  ?    Result Value  ? APPearance HAZY (*)   ? Specific Gravity, Urine 1.034 (*)   ? Ketones, ur 5 (*)   ? Leukocytes,Ua TRACE (*)   ? Bacteria, UA RARE (*)   ? All other components within normal limits  ?LIPASE, BLOOD  ?COMPREHENSIVE METABOLIC PANEL  ?CBC  ?I-STAT BETA HCG BLOOD, ED ([facility], [facility], [facility] ONLY)  ? ? ?EKG ?None ? ?Radiology ?No results found. ? ?Procedures ?Procedures  ? ? ?Medications Ordered in ED ?Medications  ?oxyCODONE-acetaminophen (PERCOCET/ROXICET) 5-325 MG per tablet 1 tablet (1 tablet Oral Given 09/14/21 1834)  ? ? ?ED Course/ Medical Decision Making/ A&P ?Clinical Course as of 09/14/21 2107  ?Nancy Fetter Sep 14, 2021  ?2106 Patient states her pain is better she appears much more comfortable.  Findings discussed with her and her female partner in the room with her. [EW]  ?  ?Clinical Course User Index ?[EW] Daleen Bo, MD  ? ?                        ?Medical Decision Making ?Patient presenting with persistent pain despite multiple evaluations, located in her right lower back and radiating to her right lower abdomen.  She has been previously evaluated with advanced imaging and blood work. ? ?Problems  Addressed: ?Musculoskeletal pain: chronic illness or injury ?   Details: Intermittent, worsening. ? ?Amount and/or Complexity of Data Reviewed ?Independent Historian:  ?   Details: She is a cogent historian ?Labs: ordered. ?   Details: Lipase, pregnancy test, metabolic panel, CBC-normal findings ? ?Risk ?Prescription drug management. ?Decision regarding hospitalization. ?Risk Details: Patient with  nonspecific pain, after prior evaluations did not show anything, pain as musculoskeletal in nature today.  Patient improved after treatment, and stable for discharge.  No need for additional ED evaluation, or hospitalization at this time.  Doubt urinary tract infection, colitis, appendicitis, spinal radiculopathy, shingles, occult fractures. ? ? ? ? ? ? ? ? ? ? ?Final Clinical Impression(s) / ED Diagnoses ?Final diagnoses:  ?Musculoskeletal pain  ? ? ?Rx / DC Orders ?ED Discharge Orders   ? ?      Ordered  ?  HYDROcodone-acetaminophen (NORCO/VICODIN) 5-325 MG tablet  Every 4 hours PRN       ? 09/14/21 2104  ? ?  ?  ? ?  ? ? ?  ?Daleen Bo, MD ?09/14/21 2107 ? ?

## 2021-10-23 ENCOUNTER — Other Ambulatory Visit: Payer: Self-pay

## 2021-10-23 ENCOUNTER — Emergency Department (HOSPITAL_COMMUNITY)
Admission: EM | Admit: 2021-10-23 | Discharge: 2021-10-23 | Disposition: A | Discharge status: Home / Self Care | Payer: 59 | Attending: Emergency Medicine | Admitting: Emergency Medicine

## 2021-10-23 ENCOUNTER — Emergency Department (HOSPITAL_COMMUNITY): Payer: 59

## 2021-10-23 DIAGNOSIS — K5730 Diverticulosis of large intestine without perforation or abscess without bleeding: Secondary | ICD-10-CM | POA: Insufficient documentation

## 2021-10-23 DIAGNOSIS — R140 Abdominal distension (gaseous): Secondary | ICD-10-CM

## 2021-10-23 DIAGNOSIS — R1031 Right lower quadrant pain: Secondary | ICD-10-CM | POA: Diagnosis present

## 2021-10-23 LAB — COMPREHENSIVE METABOLIC PANEL
ALT: 15 U/L (ref 0–44)
AST: 19 U/L (ref 15–41)
Albumin: 3.6 g/dL (ref 3.5–5.0)
Alkaline Phosphatase: 64 U/L (ref 38–126)
Anion gap: 7 (ref 5–15)
BUN: 11 mg/dL (ref 6–20)
CO2: 22 mmol/L (ref 22–32)
Calcium: 8.9 mg/dL (ref 8.9–10.3)
Chloride: 107 mmol/L (ref 98–111)
Creatinine, Ser: 0.7 mg/dL (ref 0.44–1.00)
GFR, Estimated: >60 mL/min (ref >60)
Glucose, Bld: 113 mg/dL — ABNORMAL HIGH (ref 70–99)
Potassium: 3.8 mmol/L (ref 3.5–5.1)
Sodium: 136 mmol/L (ref 135–145)
Total Bilirubin: 0.6 mg/dL (ref 0.3–1.2)
Total Protein: 7.4 g/dL (ref 6.5–8.1)

## 2021-10-23 LAB — CBC WITH DIFFERENTIAL/PLATELET
Abs Immature Granulocytes: 0.03 10*3/uL (ref 0.00–0.07)
Basophils Absolute: 0.1 10*3/uL (ref 0.0–0.1)
Basophils Relative: 1 %
Eosinophils Absolute: 0.6 10*3/uL — ABNORMAL HIGH (ref 0.0–0.5)
Eosinophils Relative: 6 %
HCT: 40.8 % (ref 36.0–46.0)
Hemoglobin: 14 g/dL (ref 12.0–15.0)
Immature Granulocytes: 0 %
Lymphocytes Relative: 25 %
Lymphs Abs: 2.5 10*3/uL (ref 0.7–4.0)
MCH: 31.5 pg (ref 26.0–34.0)
MCHC: 34.3 g/dL (ref 30.0–36.0)
MCV: 91.9 fL (ref 80.0–100.0)
Monocytes Absolute: 0.6 10*3/uL (ref 0.1–1.0)
Monocytes Relative: 6 %
Neutro Abs: 6 10*3/uL (ref 1.7–7.7)
Neutrophils Relative %: 62 %
Platelets: 378 10*3/uL (ref 150–400)
RBC: 4.44 MIL/uL (ref 3.87–5.11)
RDW: 12.6 % (ref 11.5–15.5)
WBC: 9.7 10*3/uL (ref 4.0–10.5)
nRBC: 0 % (ref 0.0–0.2)

## 2021-10-23 LAB — I-STAT BETA HCG BLOOD, ED (MC, WL, AP ONLY): I-stat hCG, quantitative: <5 m[IU]/mL (ref <5)

## 2021-10-23 LAB — URINALYSIS, ROUTINE W REFLEX MICROSCOPIC
Bilirubin Urine: NEGATIVE
Glucose, UA: NEGATIVE mg/dL
Hgb urine dipstick: NEGATIVE
Ketones, ur: NEGATIVE mg/dL
Nitrite: NEGATIVE
Protein, ur: NEGATIVE mg/dL
Specific Gravity, Urine: 1.026 (ref 1.005–1.030)
pH: 5 (ref 5.0–8.0)

## 2021-10-23 LAB — LIPASE, BLOOD: Lipase: 27 U/L (ref 11–51)

## 2021-10-23 MED ORDER — HYDROCODONE-ACETAMINOPHEN 5-325 MG PO TABS
1.0000 | ORAL_TABLET | Freq: Once | ORAL | Status: DC
Start: 2021-10-23 — End: 2021-10-23

## 2021-10-23 MED ORDER — DICYCLOMINE HCL 20 MG PO TABS
20.0000 mg | ORAL_TABLET | Freq: Three times a day (TID) | ORAL | 0 refills | Status: DC | PRN
Start: 2021-10-23 — End: 2023-12-11

## 2021-10-23 MED ORDER — DICYCLOMINE HCL 10 MG PO CAPS
10.0000 mg | ORAL_CAPSULE | Freq: Once | ORAL | Status: AC
  Administered 2021-10-23: 10 mg via ORAL
  Filled 2021-10-23: qty 1

## 2021-10-23 MED ORDER — IOHEXOL 300 MG/ML  SOLN
100.0000 mL | Freq: Once | INTRAMUSCULAR | Status: AC | PRN
Start: 2021-10-23 — End: 2021-10-23
  Administered 2021-10-23: 100 mL via INTRAVENOUS

## 2021-10-23 MED ORDER — KETOROLAC TROMETHAMINE 15 MG/ML IJ SOLN
15.0000 mg | Freq: Once | INTRAMUSCULAR | Status: AC
  Administered 2021-10-23: 15 mg via INTRAVENOUS
  Filled 2021-10-23: qty 1

## 2021-10-23 NOTE — ED Triage Notes (Addendum)
Pt to ED with c/o abd pain and distention. Pt states she was seen in [facility] ED recently for the same and was told to come back to ER if symptoms worsened. Patient states "it feels like there is something in there." Pt also reports urinary frequency, denies pain with urination. ?

## 2021-10-23 NOTE — Discharge Instructions (Addendum)
Start Bentyl, 1 tablet three times a day as needed for abdominal pain/cramping. ?Please follow-up with your primary care doctor within the next week. ?
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
## 2021-10-23 NOTE — ED Provider Triage Note (Signed)
Emergency Medicine Provider Triage Evaluation Note ? ?Patricia Drake , a 36 y.o. female  was evaluated in triage.  Pt complains of abd pain. Pain x 3 days, and abd distension for the past few month.  Unsure last menstruation.  Having nausea without vomiting.  Chronic lower back pain, increase urinary frequency.   ? ?Review of Systems  ?Positive: As above ?Negative: As above ? ?Physical Exam  ?BP 129/86 (BP Location: Right Arm)   Pulse 90   Temp 98.7 ?F (37.1 ?C) (Oral)   Resp 14   Ht 5\' 2"  (1.575 m)   Wt 108.9 kg   SpO2 100%   BMI 43.91 kg/m?  ?Gen:   Awake, no distress   ?Resp:  Normal effort  ?MSK:   Moves extremities without difficulty  ?Other:   ? ?Medical Decision Making  ?Medically screening exam initiated at 11:59 AM.  Appropriate orders placed.  SHIREL MADEJ was informed that the remainder of the evaluation will be completed by another provider, this initial triage assessment does not replace that evaluation, and the importance of remaining in the ED until their evaluation is complete. ? ? ?  ?Domenic Moras, PA-C ?10/23/21 1200 ? ?

## 2021-10-25 LAB — URINE CULTURE: Culture: NO GROWTH

## 2022-04-16 ENCOUNTER — Encounter (HOSPITAL_COMMUNITY): Payer: Self-pay

## 2022-04-16 ENCOUNTER — Emergency Department (HOSPITAL_COMMUNITY)
Admission: EM | Admit: 2022-04-16 | Discharge: 2022-04-16 | Disposition: A | Discharge status: Home / Self Care | Payer: Commercial Managed Care - HMO | Attending: Emergency Medicine | Admitting: Emergency Medicine

## 2022-04-16 ENCOUNTER — Other Ambulatory Visit: Payer: Self-pay

## 2022-04-16 DIAGNOSIS — H6691 Otitis media, unspecified, right ear: Secondary | ICD-10-CM | POA: Insufficient documentation

## 2022-04-16 DIAGNOSIS — H6690 Otitis media, unspecified, unspecified ear: Secondary | ICD-10-CM

## 2022-04-16 DIAGNOSIS — H9201 Otalgia, right ear: Secondary | ICD-10-CM | POA: Diagnosis present

## 2022-04-16 MED ORDER — AMOXICILLIN-POT CLAVULANATE 875-125 MG PO TABS: 1.0000 | ORAL_TABLET | Freq: Two times a day (BID) | ORAL | 0 refills | Status: DC

## 2022-04-16 NOTE — ED Triage Notes (Signed)
Pt c/o right ear pain for a few weeks. Pt states she saw a tele MD and was prescribed antibiotics with no relief. Pt has been taking over the counter pain meds with no relief.

## 2022-04-16 NOTE — Discharge Instructions (Signed)
You were seen today for an infection of the right ear.  I have prescribed Augmentin, an antibiotic.  Please take this as prescribed.  Please note that this may cause diarrhea.  This is a normal adverse effect of this medication.  If you fail to see improvement after this medication I recommend following up with your nose and throat.  I have listed contact information above.

## 2022-04-16 NOTE — ED Provider Notes (Signed)
[place] DEPT Provider Note   CSN: [id number] Arrival date & time: 04/16/22  1416     History  Chief Complaint  Patient presents with   Right Ear Pain    Patricia Drake is a 36 y.o. female.  Patient presents to the emergency department complaining of right-sided ear pain.  She states that her symptoms began in early September.  She was evaluated by telemedicine and prescribed amoxicillin and Ciprodex drops.  She states that she was unable to fill the Ciprodex drops because local pharmacies did not have them but completed the amoxicillin in mid September.  She states she is continue to have pain since that time.  She denies external ear pain at this time but endorses internal pain/pressure with some associated pain with chewing.  Patient denies fevers, nausea, vomiting, abdominal pain, cough.  History significant for obesity, depression, seizures, anxiety  HPI     Home Medications Prior to Admission medications   Medication Sig Start Date End Date Taking? Authorizing Provider  amoxicillin-clavulanate (AUGMENTIN) 875-125 MG tablet Take 1 tablet by mouth every 12 (twelve) hours. 04/16/22  Yes Dorothyann Peng, PA-C  albuterol (VENTOLIN HFA) 108 (90 Base) MCG/ACT inhaler Inhale 2 puffs into the lungs 4 (four) times daily as needed for wheezing or shortness of breath. 01/28/21   [provider]  cyclobenzaprine (FLEXERIL) 10 MG tablet Take 10 mg by mouth at bedtime.    [provider]  dicyclomine (BENTYL) 20 MG tablet Take 1 tablet (20 mg total) by mouth 3 (three) times daily as needed (abdominal pain/cramping). 10/23/21   Sondra Come, MD  divalproex (DEPAKOTE) 500 MG DR tablet Take 1 tablet (500 mg total) by mouth every 12 (twelve) hours. 07/24/21 08/23/21  Briant Cedar, MD  fluticasone-salmeterol (ADVAIR) 100-50 MCG/ACT AEPB Inhale 1 puff into the lungs 2 (two) times daily.    [provider]  HYDROcodone-acetaminophen  (NORCO/VICODIN) 5-325 MG tablet Take 1 tablet by mouth every 4 (four) hours as needed. 09/14/21   Daleen Bo, MD  ibuprofen (ADVIL) 800 MG tablet Take 1 tablet (800 mg total) by mouth every 8 (eight) hours as needed for moderate pain. 08/02/21   Milton Ferguson, MD  levETIRAcetam (KEPPRA) 750 MG tablet Take 1 tablet (750 mg total) by mouth 2 (two) times daily. 07/20/21   Isla Pence, MD  mirtazapine (REMERON) 15 MG tablet Take 15 mg by mouth at bedtime.    [provider]  omeprazole (PRILOSEC) 20 MG capsule Take 20 mg by mouth daily. 01/28/21   [provider]  ondansetron (ZOFRAN-ODT) 4 MG disintegrating tablet Take 1 tablet (4 mg total) by mouth every 8 (eight) hours as needed for nausea. 09/10/21   Etta Quill, NP  prazosin (MINIPRESS) 1 MG capsule Take 1 mg by mouth at bedtime.    [provider]  phenytoin (DILANTIN) 100 MG ER capsule Take 1 capsule (100 mg total) by mouth 3 (three) times daily. Patient not taking: Reported on 09/27/2019 09/11/90 10/08/81  Delora Fuel, MD      Allergies    Sulfa antibiotics    Review of Systems   Review of Systems  HENT:  Positive for ear pain. Negative for ear discharge, facial swelling, hearing loss, sore throat and trouble swallowing.   Respiratory:  Negative for cough.   Gastrointestinal:  Negative for nausea.    Physical Exam Updated Vital Signs BP (!) 144/107   Pulse 89   Temp 98.8 F (37.1 C) (Oral)  Resp 18   SpO2 99%  Physical Exam HENT:     Head: Normocephalic and atraumatic.     Right Ear: No hemotympanum. Tympanic membrane is bulging. Tympanic membrane is not injected, perforated, erythematous or retracted.     Left Ear: Tympanic membrane normal.  Eyes:     Conjunctiva/sclera: Conjunctivae normal.  Cardiovascular:     Rate and Rhythm: Normal rate.  Pulmonary:     Effort: Pulmonary effort is normal. No respiratory distress.  Musculoskeletal:        General: No signs of injury.     Cervical back:  Normal range of motion.  Skin:    General: Skin is dry.  Neurological:     Mental Status: She is alert.  Psychiatric:        Speech: Speech normal.        Behavior: Behavior normal.     ED Results / Procedures / Treatments   Labs (all labs ordered are listed, but only abnormal results are displayed) Labs Reviewed - No data to display  EKG None  Radiology No results found.  Procedures Procedures    Medications Ordered in ED Medications - No data to display  ED Course/ Medical Decision Making/ A&P                           Medical Decision Making  Patient presents to the emergency department with a chief complaint of right-sided ear pain.  Differential diagnosis includes but is not limited to otitis media, otitis externa, mastoiditis, and others  I reviewed the patient's past medical history but no notes were available, telehealth visit  There is no indication at this time for labs or imaging  Patient's physical exam is consistent with otitis media of the right ear.  She has no significant external tenderness to suggest otitis externa at this time.  Plan to discharge the patient on Augmentin as she previously failed amoxicillin almost 2 months ago.  If she fails to improve after this prescription recommending follow-up with ENT for further evaluation and management.  Contact information for ENT provided.  There is no indication for admission.  Discharge home.        Final Clinical Impression(s) / ED Diagnoses Final diagnoses:  Acute otitis media, unspecified otitis media type    Rx / DC Orders ED Discharge Orders          Ordered    amoxicillin-clavulanate (AUGMENTIN) 875-125 MG tablet  Every 12 hours        04/16/22 1551              Ronny Bacon 04/16/22 1552    Sherwood Gambler, MD 04/17/22 1659

## 2023-02-04 IMAGING — MR MR HEAD WO/W CM
17 of 19 series · 42 of 48 positions shown · IV contrast (gadavist)
Comparison: CT from earlier the same day.

CLINICAL DATA: Initial evaluation for neuro deficit, stroke
suspected. Seizure.

EXAM:
MRI HEAD WITHOUT AND WITH CONTRAST
TECHNIQUE: Multiplanar, multiecho pulse sequences of the brain and surrounding
structures were obtained without and with intravenous contrast.
CONTRAST:  10mL GADAVIST GADOBUTROL 1 MMOL/ML IV SOLN

[Series 5: DWI · axial · 3.0mm · 0.88mm/px · z∈[-75,+65]mm · 5 of 96 slices shown (1 of 4)]
[im 1/96]
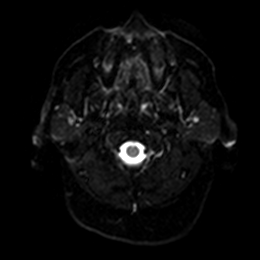
[im 24/96]
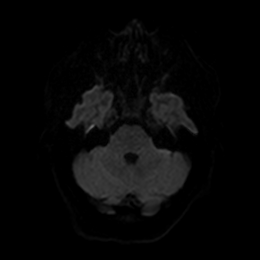
[im 48/96]
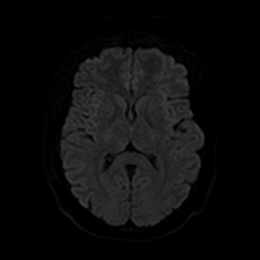
[im 72/96]
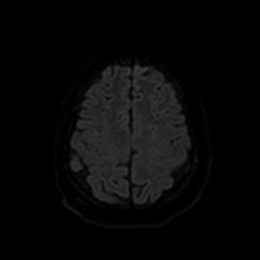
[im 96/96]
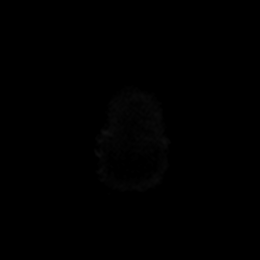

[Series 6: DWI · axial · 3.0mm · 0.88mm/px · z∈[-75,+65]mm · 2 of 48 slices shown (2 of 4)]
[im 1/48]
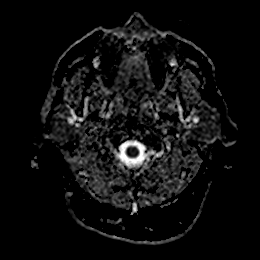
[im 48/48]
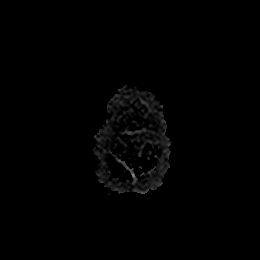

[Series 7: DWI · coronal · 4.0mm · 0.88mm/px · 3 of 64 slices shown (3 of 4)]
[im 1/64]
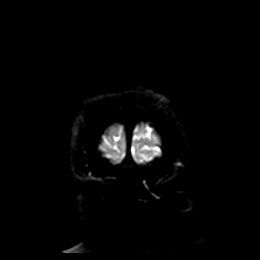
[im 32/64]
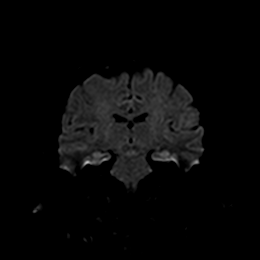
[im 64/64]
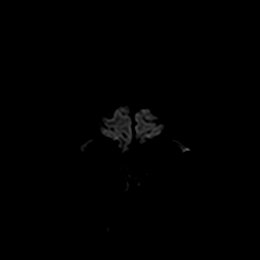

[Series 8: DWI · coronal · 4.0mm · 0.88mm/px · 1 of 32 slices shown (4 of 4)]
[im 1/32]
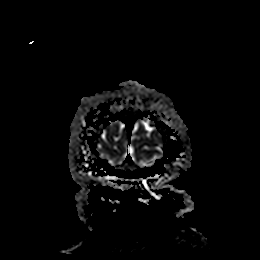

[Series 9: T1 · sagittal · 5.0mm · 0.75mm/px · 1 of 23 slices shown]
[im 1/23]
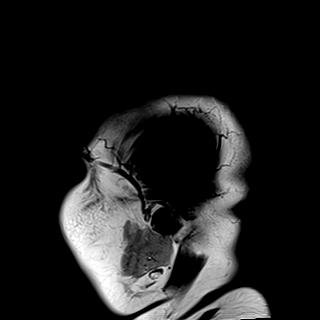

[Series 10: T2 · axial · 5.0mm · 0.72mm/px · 1 of 25 slices shown (1 of 2)]
[im 1/25]
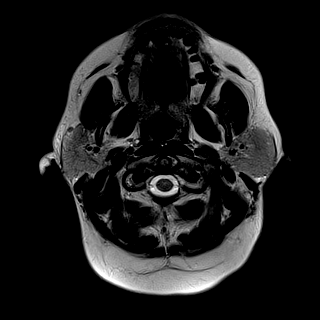

[Series 11: FLAIR · axial · 5.0mm · 0.45mm/px · 1 of 25 slices shown (1 of 2)]
[im 1/25]
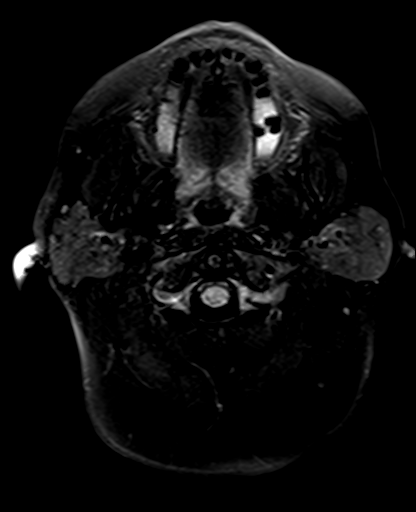

[Series 12: mag_images · axial · 3.0mm · 0.90mm/px · z∈[-94,+82]mm · 3 of 60 slices shown]
[im 1/60]
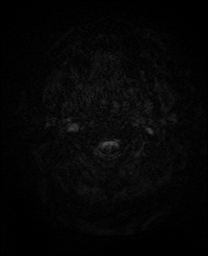
[im 30/60]
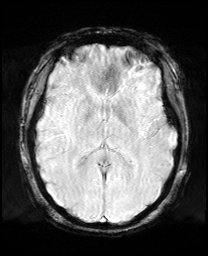
[im 60/60]
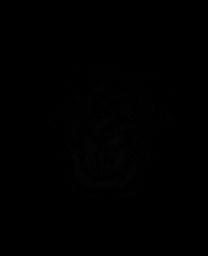

[Series 13: pha_images · axial · 3.0mm · 0.90mm/px · z∈[-94,+76]mm · 3 of 57 slices shown]
[im 1/57]
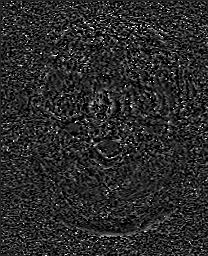
[im 29/57]
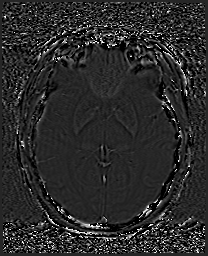
[im 57/57]
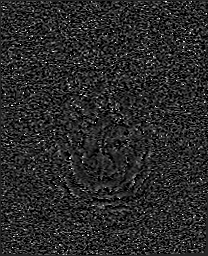

[Series 14: swi_images · axial · 3.0mm · 0.90mm/px · z∈[-94,+82]mm · 3 of 60 slices shown]
[im 1/60]
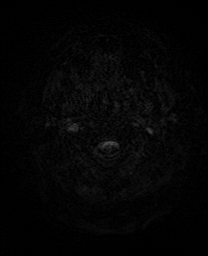
[im 30/60]
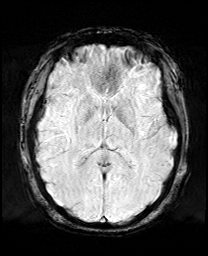
[im 60/60]
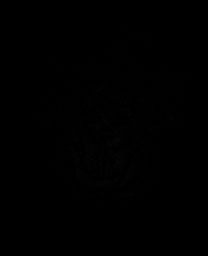

[Series 15: mip_images(sw) · axial · 24.0mm · 0.90mm/px · z∈[-84,+71]mm · 2 of 53 slices shown]
[im 1/53]
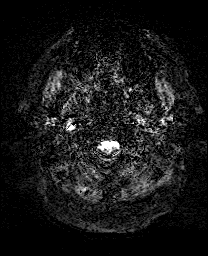
[im 53/53]
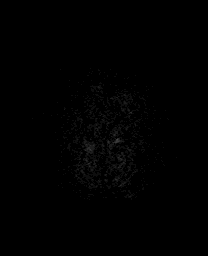

[Series 17: t1_mprage_tra_p2_iso · axial · 1.0mm · 0.98mm/px · z∈[-95,+80]mm · 8 of 174 slices shown]
[im 1/174]
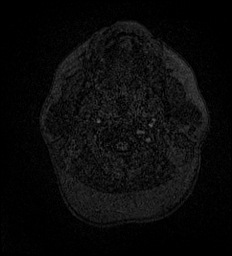
[im 25/174]
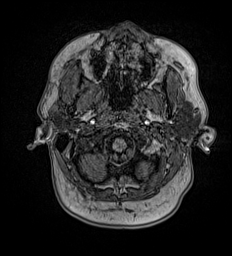
[im 50/174]
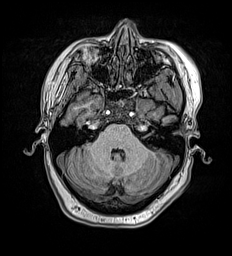
[im 75/174]
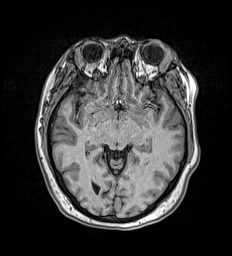
[im 99/174]
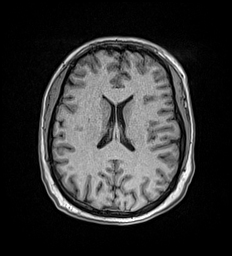
[im 124/174]
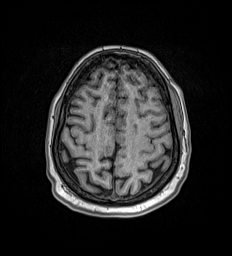
[im 149/174]
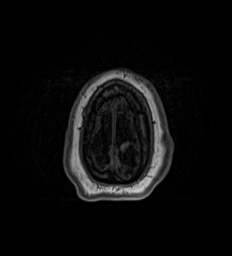
[im 174/174]
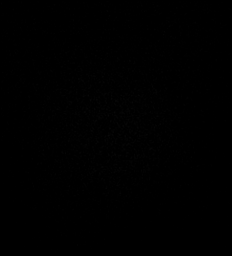

[Series 18: t1_mprage_tra_p2_iso_mpr_coronal · coronal · 1.0mm · 0.45mm/px · 5 of 120 slices shown]
[im 1/120]
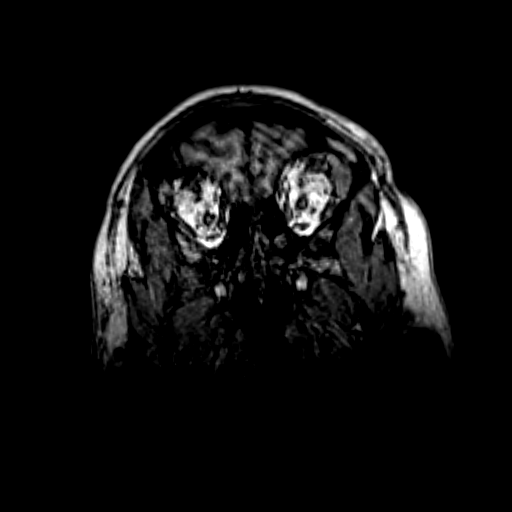
[im 30/120]
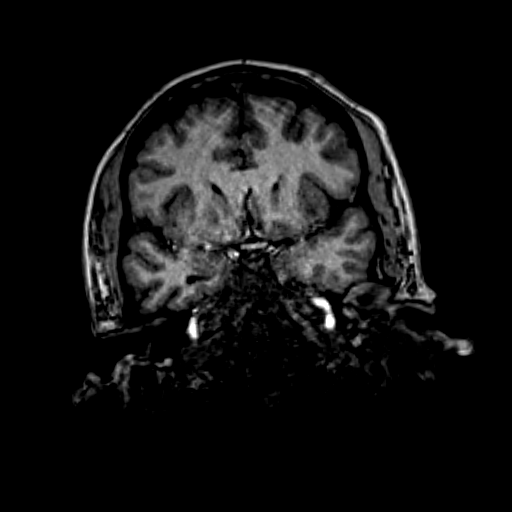
[im 60/120]
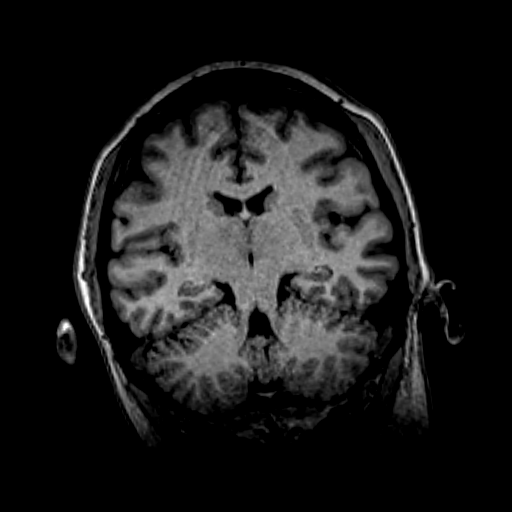
[im 90/120]
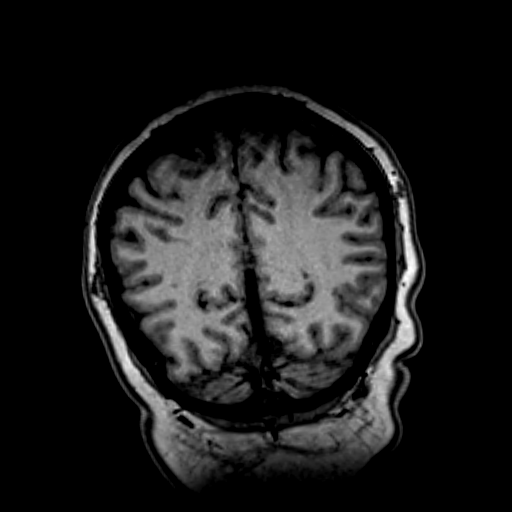
[im 120/120]
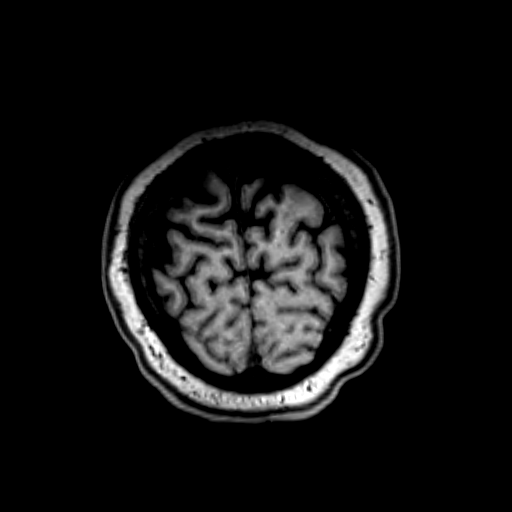

[Series 19: T2 · coronal · 3.0mm · 0.27mm/px · 1 of 30 slices shown (2 of 2)]
[im 1/30]
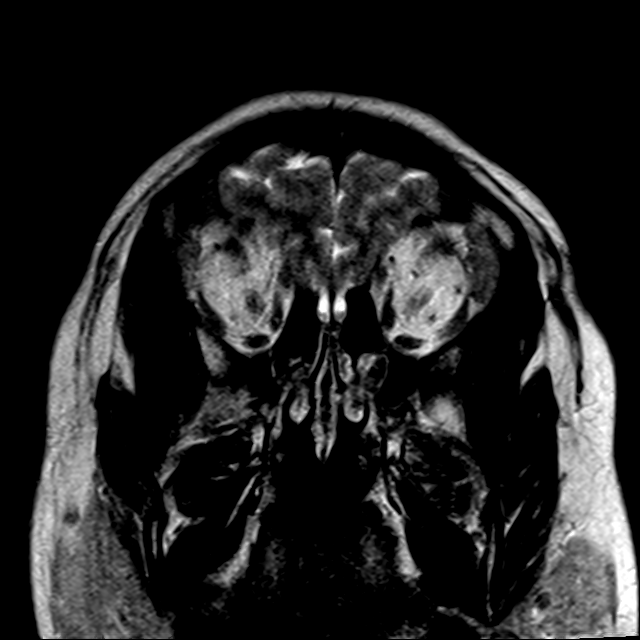

[Series 20: FLAIR · coronal · 3.0mm · 0.56mm/px · 1 of 30 slices shown (2 of 2)]
[im 1/30]
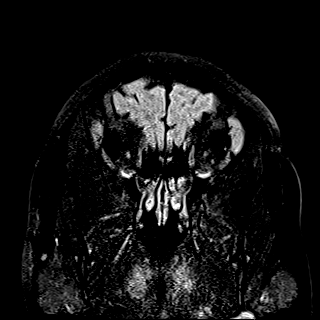

[Series 21: T2 post-contrast · coronal · 5.0mm · 0.72mm/px · 1 of 28 slices shown]
[im 1/28]
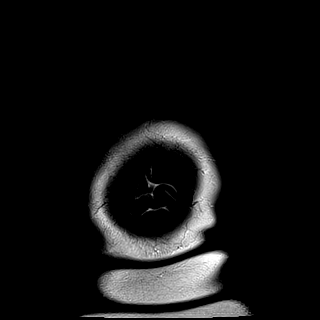

[Series 23: T1 post-contrast · coronal · 5.0mm · 0.34mm/px · 1 of 30 slices shown]
[im 1/30]
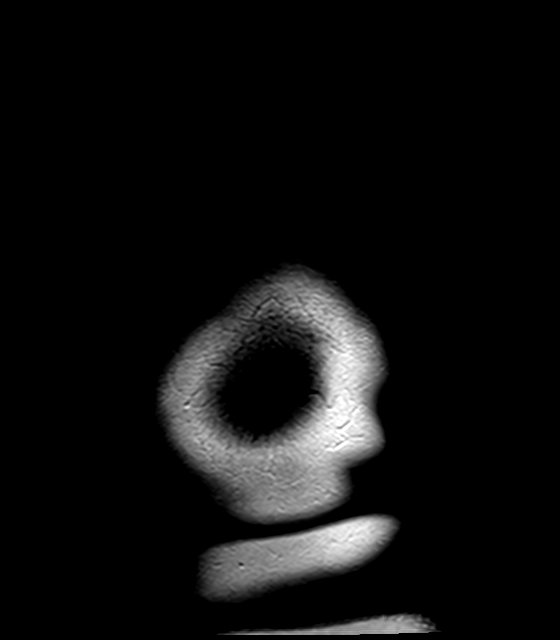

[42 of 48 positions shown; findings below may reference images not displayed]

FINDINGS: Brain: Cerebral volume within normal limits for patient age. No
focal parenchymal signal abnormality identified.

No abnormal foci of restricted diffusion to suggest acute or
subacute ischemia. Gray-white matter differentiation well
maintained. No encephalomalacia to suggest chronic infarction. No
foci of susceptibility artifact to suggest acute or chronic
intracranial hemorrhage.

No mass lesion, midline shift or mass effect. No hydrocephalus. No
extra-axial fluid collection.

Pituitary gland and suprasellar region are normal. Midline
structures intact and normal. No intrinsic temporal lobe
abnormality.

No abnormal enhancement.

Vascular: Major intracranial vascular flow voids well maintained and
normal in appearance.

Skull and upper cervical spine: Craniocervical junction normal.
Visualized upper cervical spine within normal limits. Bone marrow
signal intensity normal. No scalp soft tissue abnormality.

Sinuses/Orbits: Globes and orbital soft tissues within normal
limits.

Paranasal sinuses are clear. No significant mastoid effusion. Inner
ear structures grossly normal.

Other: None.
IMPRESSION: Normal brain MRI.  No acute intracranial abnormality.

## 2023-02-12 ENCOUNTER — Emergency Department (HOSPITAL_COMMUNITY)
Admission: EM | Admit: 2023-02-12 | Discharge: 2023-02-12 | Disposition: A | Discharge status: Home / Self Care | Payer: Self-pay | Attending: Emergency Medicine | Admitting: Emergency Medicine

## 2023-02-12 ENCOUNTER — Encounter (HOSPITAL_COMMUNITY): Payer: Self-pay

## 2023-02-12 ENCOUNTER — Emergency Department (HOSPITAL_COMMUNITY): Payer: Self-pay

## 2023-02-12 DIAGNOSIS — Z1152 Encounter for screening for COVID-19: Secondary | ICD-10-CM | POA: Insufficient documentation

## 2023-02-12 DIAGNOSIS — R051 Acute cough: Secondary | ICD-10-CM

## 2023-02-12 DIAGNOSIS — R079 Chest pain, unspecified: Secondary | ICD-10-CM | POA: Insufficient documentation

## 2023-02-12 LAB — COMPREHENSIVE METABOLIC PANEL
ALT: 104 U/L — ABNORMAL HIGH (ref 0–44)
AST: 88 U/L — ABNORMAL HIGH (ref 15–41)
Albumin: 3.6 g/dL (ref 3.5–5.0)
Alkaline Phosphatase: 87 U/L (ref 38–126)
Anion gap: 13 (ref 5–15)
BUN: 8 mg/dL (ref 6–20)
CO2: 22 mmol/L (ref 22–32)
Calcium: 9 mg/dL (ref 8.9–10.3)
Chloride: 102 mmol/L (ref 98–111)
Creatinine, Ser: 0.69 mg/dL (ref 0.44–1.00)
GFR, Estimated: >60 mL/min (ref >60)
Glucose, Bld: 111 mg/dL — ABNORMAL HIGH (ref 70–99)
Potassium: 3.3 mmol/L — ABNORMAL LOW (ref 3.5–5.1)
Sodium: 137 mmol/L (ref 135–145)
Total Bilirubin: 0.9 mg/dL (ref 0.3–1.2)
Total Protein: 8.3 g/dL — ABNORMAL HIGH (ref 6.5–8.1)

## 2023-02-12 LAB — HCG, SERUM, QUALITATIVE: Preg, Serum: NEGATIVE

## 2023-02-12 LAB — RESPIRATORY PANEL BY PCR

## 2023-02-12 LAB — CBC
HCT: 42.8 % (ref 36.0–46.0)
Hemoglobin: 14.2 g/dL (ref 12.0–15.0)
MCH: 29.6 pg (ref 26.0–34.0)
MCHC: 33.2 g/dL (ref 30.0–36.0)
MCV: 89.2 fL (ref 80.0–100.0)
Platelets: 430 10*3/uL — ABNORMAL HIGH (ref 150–400)
RBC: 4.8 MIL/uL (ref 3.87–5.11)
RDW: 13.2 % (ref 11.5–15.5)
WBC: 10 10*3/uL (ref 4.0–10.5)
nRBC: 0 % (ref 0.0–0.2)

## 2023-02-12 LAB — TROPONIN I (HIGH SENSITIVITY): Troponin I (High Sensitivity): <2 ng/L (ref <18)

## 2023-02-12 MED ORDER — KETOROLAC TROMETHAMINE 15 MG/ML IJ SOLN
15.0000 mg | Freq: Once | INTRAMUSCULAR | Status: AC
  Administered 2023-02-12: 15 mg via INTRAMUSCULAR
  Filled 2023-02-12: qty 1

## 2023-02-12 MED ORDER — BENZONATATE 100 MG PO CAPS
100.0000 mg | ORAL_CAPSULE | Freq: Once | ORAL | Status: AC
  Administered 2023-02-12: 100 mg via ORAL
  Filled 2023-02-12: qty 1

## 2023-02-12 MED ORDER — BENZONATATE 100 MG PO CAPS: 100.0000 mg | ORAL_CAPSULE | Freq: Three times a day (TID) | ORAL | 0 refills | Status: DC

## 2023-02-12 MED ORDER — KETOROLAC TROMETHAMINE 15 MG/ML IJ SOLN: 15.0000 mg | Freq: Once | INTRAMUSCULAR | Status: DC

## 2023-02-12 NOTE — Discharge Instructions (Addendum)
You were seen today for cough.  Your chest x-ray does not show any signs of pneumonia.  Your viral panel was positive for rhino enterovirus.  I recommend good hydration as well as Tylenol and ibuprofen for pain.  You can also use the [employer] as prescribed help with cough.  Please return to the emergency department for new or worsening pain, shortness of breath, fever.
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
## 2023-02-12 NOTE — ED Triage Notes (Signed)
Productive cough x 4 days with sore throat, chills, reports loss of sense of smell. Also complains of left ear pain. Took a home test for covid and it was negative.

## 2023-02-12 NOTE — ED Notes (Signed)
Discharge instructions discussed with pt. Verbalized understanding. VSS. No questions or concerns regarding discharge  

## 2023-04-01 ENCOUNTER — Emergency Department (HOSPITAL_COMMUNITY)
Admission: EM | Admit: 2023-04-01 | Discharge: 2023-04-01 | Disposition: A | Discharge status: Home / Self Care | Payer: Self-pay | Attending: Emergency Medicine | Admitting: Emergency Medicine

## 2023-04-01 ENCOUNTER — Other Ambulatory Visit: Payer: Self-pay

## 2023-04-01 ENCOUNTER — Encounter (HOSPITAL_COMMUNITY): Payer: Self-pay

## 2023-04-01 DIAGNOSIS — K047 Periapical abscess without sinus: Secondary | ICD-10-CM | POA: Insufficient documentation

## 2023-04-01 DIAGNOSIS — K029 Dental caries, unspecified: Secondary | ICD-10-CM | POA: Insufficient documentation

## 2023-04-01 MED ORDER — AMOXICILLIN-POT CLAVULANATE 875-125 MG PO TABS: 1.0000 | ORAL_TABLET | Freq: Two times a day (BID) | ORAL | 0 refills | Status: DC

## 2023-04-01 NOTE — ED Triage Notes (Signed)
Patient reports right sided dental pain. Has a decaying tooth, attempted to go to the dentist, but could not afford for it to be pulled.

## 2023-04-01 NOTE — ED Provider Notes (Signed)
EMERGENCY DEPARTMENT AT [facility] Provider Note   CSN: [id number] Arrival date & time: 04/01/23  1219     History  Chief Complaint  Patient presents with   Dental Pain    Patricia Drake is a 37 y.o. female.  37 year old female with months-year + dental pain, worse for the past few days. Had a dental appointment and was given price to pay tooth but when she returned was going to cost much more and couldn't afford to pull the tooth. Right sided mouth pain. Not relieved with oragel and mexican gel. Similar pain 1 year ago, treated with abx which helped.  No fevers, no trauma, no drainage.  History of seizures, depression, anxiety.        Home Medications Prior to Admission medications   Medication Sig Start Date End Date Taking? Authorizing Provider  albuterol (VENTOLIN HFA) 108 (90 Base) MCG/ACT inhaler Inhale 2 puffs into the lungs 4 (four) times daily as needed for wheezing or shortness of breath. 01/28/21   [provider]  amoxicillin-clavulanate (AUGMENTIN) 875-125 MG tablet Take 1 tablet by mouth every 12 (twelve) hours. 04/01/23   Jeannie Fend, PA-C  cyclobenzaprine (FLEXERIL) 10 MG tablet Take 10 mg by mouth at bedtime.    [provider]  dicyclomine (BENTYL) 20 MG tablet Take 1 tablet (20 mg total) by mouth 3 (three) times daily as needed (abdominal pain/cramping). 10/23/21   Laurence Compton, MD  divalproex (DEPAKOTE) 500 MG DR tablet Take 1 tablet (500 mg total) by mouth every 12 (twelve) hours. 07/24/21 08/23/21  Lauro Franklin, MD  fluticasone-salmeterol (ADVAIR) 100-50 MCG/ACT AEPB Inhale 1 puff into the lungs 2 (two) times daily.    [provider]  levETIRAcetam (KEPPRA) 750 MG tablet Take 1 tablet (750 mg total) by mouth 2 (two) times daily. 07/20/21   Jacalyn Lefevre, MD  mirtazapine (REMERON) 15 MG tablet Take 15 mg by mouth at bedtime.    [provider]  omeprazole (PRILOSEC) 20 MG capsule Take 20  mg by mouth daily. 01/28/21   [provider]  prazosin (MINIPRESS) 1 MG capsule Take 1 mg by mouth at bedtime.    [provider]  phenytoin (DILANTIN) 100 MG ER capsule Take 1 capsule (100 mg total) by mouth 3 (three) times daily. Patient not taking: Reported on 09/27/2019 01/01/19 09/28/19  Dione Booze, MD      Allergies    Sulfa antibiotics    Review of Systems   Review of Systems Negative except as per HPI Physical Exam Updated Vital Signs BP (!) 153/93 (BP Location: Left Arm)   Pulse 65   Temp 98.4 F (36.9 C) (Oral)   Resp 18   Ht 5\' 2"  (1.575 m)   Wt 127 kg   SpO2 100%   BMI 51.21 kg/m  Physical Exam Vitals and nursing note reviewed.  Constitutional:      General: She is not in acute distress.    Appearance: She is well-developed. She is not diaphoretic.  HENT:     Head: Normocephalic and atraumatic.     Jaw: No trismus.     Mouth/Throat:     Dentition: Dental tenderness, gingival swelling and dental caries present.      Comments: Right lower molar with decay to the gumline with surrounding inflammation of the gingiva.  Pulmonary:     Effort: Pulmonary effort is normal.  Musculoskeletal:     Cervical back: Neck supple. No tenderness.  Lymphadenopathy:     Cervical: No cervical adenopathy.  Skin:    General: Skin is warm and dry.  Neurological:     Mental Status: She is alert and oriented to person, place, and time.  Psychiatric:        Behavior: Behavior normal.     ED Results / Procedures / Treatments   Labs (all labs ordered are listed, but only abnormal results are displayed) Labs Reviewed - No data to display  EKG None  Radiology No results found.  Procedures Procedures    Medications Ordered in ED Medications - No data to display  ED Course/ Medical Decision Making/ A&P                                 Medical Decision Making  37 year old female with right lower dental pain as above. On exam, has decay of right  lower molar to the gingiva. No obvious drainable abscess, no trismus, is tolerating secretions. Suspect her BP is elevated due to pain. Provided with lidocaine gel for pain today. Dc with abx, recommend motrin/tylenol, rinse with Listerine and see dds, provided with resources.   Bp improve although still elevated at 153/03 on recheck.         Final Clinical Impression(s) / ED Diagnoses Final diagnoses:  Dental abscess    Rx / DC Orders ED Discharge Orders          Ordered    amoxicillin-clavulanate (AUGMENTIN) 875-125 MG tablet  Every 12 hours        04/01/23 1311              Jeannie Fend, PA-C 04/01/23 1330    Anders Simmonds T, DO 04/02/23 0703

## 2023-04-01 NOTE — Discharge Instructions (Signed)
Take Advil liquid gel 600mg  and Tylenol 650mg  every 6 hours as needed as directed for pain.  Rinse with listerine after every meal.  Apply lidocaine gel as needed for pain.  Take Augmentin as prescribed and complete the full course.   Follow up with a dentist. See resource guide, referral numbers, consider [facility] (appears to be a different business from [facility])

## 2023-04-05 NOTE — Plan of Care (Signed)
[facility] Tonsillectomy/Adenoidectomy, Postoperative PEDS care plan entered in error.

## 2023-09-20 ENCOUNTER — Emergency Department (HOSPITAL_COMMUNITY)

## 2023-09-20 ENCOUNTER — Encounter (HOSPITAL_COMMUNITY): Payer: Self-pay

## 2023-09-20 ENCOUNTER — Other Ambulatory Visit: Payer: Self-pay

## 2023-09-20 ENCOUNTER — Emergency Department (HOSPITAL_COMMUNITY)
Admission: EM | Admit: 2023-09-20 | Discharge: 2023-09-20 | Disposition: A | Discharge status: Home / Self Care | Attending: Emergency Medicine | Admitting: Emergency Medicine

## 2023-09-20 DIAGNOSIS — R1031 Right lower quadrant pain: Secondary | ICD-10-CM | POA: Diagnosis present

## 2023-09-20 DIAGNOSIS — K5792 Diverticulitis of intestine, part unspecified, without perforation or abscess without bleeding: Secondary | ICD-10-CM | POA: Insufficient documentation

## 2023-09-20 LAB — URINALYSIS, ROUTINE W REFLEX MICROSCOPIC
Glucose, UA: NEGATIVE mg/dL
Hgb urine dipstick: NEGATIVE
Ketones, ur: NEGATIVE mg/dL
Leukocytes,Ua: NEGATIVE
Nitrite: NEGATIVE
Protein, ur: 30 mg/dL — AB
Specific Gravity, Urine: 1.015 (ref 1.005–1.030)
pH: 7.5 (ref 5.0–8.0)

## 2023-09-20 LAB — HCG, SERUM, QUALITATIVE: Preg, Serum: NEGATIVE

## 2023-09-20 LAB — CBC
HCT: 43 % (ref 36.0–46.0)
Hemoglobin: 13.8 g/dL (ref 12.0–15.0)
MCH: 29.2 pg (ref 26.0–34.0)
MCHC: 32.1 g/dL (ref 30.0–36.0)
MCV: 91.1 fL (ref 80.0–100.0)
Platelets: 385 10*3/uL (ref 150–400)
RBC: 4.72 MIL/uL (ref 3.87–5.11)
RDW: 14.2 % (ref 11.5–15.5)
WBC: 11.6 10*3/uL — ABNORMAL HIGH (ref 4.0–10.5)
nRBC: 0 % (ref 0.0–0.2)

## 2023-09-20 LAB — LIPASE, BLOOD: Lipase: 52 U/L — ABNORMAL HIGH (ref 11–51)

## 2023-09-20 LAB — COMPREHENSIVE METABOLIC PANEL WITH GFR
ALT: 67 U/L — ABNORMAL HIGH (ref 0–44)
AST: 38 U/L (ref 15–41)
Albumin: 3.4 g/dL — ABNORMAL LOW (ref 3.5–5.0)
Alkaline Phosphatase: 75 U/L (ref 38–126)
Anion gap: 8 (ref 5–15)
BUN: 6 mg/dL (ref 6–20)
CO2: 23 mmol/L (ref 22–32)
Calcium: 8.4 mg/dL — ABNORMAL LOW (ref 8.9–10.3)
Chloride: 106 mmol/L (ref 98–111)
Creatinine, Ser: 0.51 mg/dL (ref 0.44–1.00)
GFR, Estimated: >60 mL/min (ref >60)
Glucose, Bld: 97 mg/dL (ref 70–99)
Potassium: 3.3 mmol/L — ABNORMAL LOW (ref 3.5–5.1)
Sodium: 137 mmol/L (ref 135–145)
Total Bilirubin: 0.7 mg/dL (ref 0.0–1.2)
Total Protein: 7.7 g/dL (ref 6.5–8.1)

## 2023-09-20 LAB — URINALYSIS, MICROSCOPIC (REFLEX)

## 2023-09-20 MED ORDER — IOHEXOL 300 MG/ML  SOLN
100.0000 mL | Freq: Once | INTRAMUSCULAR | Status: AC | PRN
  Administered 2023-09-20: 100 mL via INTRAVENOUS

## 2023-09-20 MED ORDER — ONDANSETRON HCL 4 MG/2ML IJ SOLN
4.0000 mg | Freq: Once | INTRAMUSCULAR | Status: AC
  Administered 2023-09-20: 4 mg via INTRAVENOUS
  Filled 2023-09-20: qty 2

## 2023-09-20 MED ORDER — SODIUM CHLORIDE (PF) 0.9 % IJ SOLN
INTRAMUSCULAR | Status: AC
  Filled 2023-09-20: qty 50

## 2023-09-20 MED ORDER — POTASSIUM CHLORIDE CRYS ER 20 MEQ PO TBCR
40.0000 meq | EXTENDED_RELEASE_TABLET | Freq: Once | ORAL | Status: AC
  Administered 2023-09-20: 40 meq via ORAL
  Filled 2023-09-20: qty 2

## 2023-09-20 MED ORDER — METRONIDAZOLE 500 MG PO TABS: 500.0000 mg | ORAL_TABLET | Freq: Two times a day (BID) | ORAL | 0 refills | Status: DC

## 2023-09-20 MED ORDER — AMOXICILLIN-POT CLAVULANATE 875-125 MG PO TABS: 1.0000 | ORAL_TABLET | Freq: Two times a day (BID) | ORAL | 0 refills | Status: DC

## 2023-09-20 MED ORDER — MORPHINE SULFATE (PF) 4 MG/ML IV SOLN
4.0000 mg | Freq: Once | INTRAVENOUS | Status: AC
  Administered 2023-09-20: 4 mg via INTRAVENOUS
  Filled 2023-09-20: qty 1

## 2023-09-20 MED ORDER — ONDANSETRON 4 MG PO TBDP: 4.0000 mg | ORAL_TABLET | Freq: Four times a day (QID) | ORAL | 0 refills | Status: DC | PRN

## 2023-09-20 MED ORDER — OXYCODONE-ACETAMINOPHEN 5-325 MG PO TABS: 1.0000 | ORAL_TABLET | Freq: Four times a day (QID) | ORAL | 0 refills | Status: DC | PRN

## 2023-09-20 NOTE — ED Notes (Signed)
 Patient transported to CT

## 2023-09-20 NOTE — ED Triage Notes (Signed)
 Patient began having right lower quadrant abdominal pain since Saturday. Has been vomiting. No diarrhea. Painful to walk.

## 2023-09-20 NOTE — Discharge Instructions (Addendum)
 As we discussed I would stick to a clear liquid diet for 3 to 5 days, but return to clear liquid diet if you start to eat foods that makes your abdominal pain worse.  You can use the pain medication I have prescribed up to every 6 hours, you can take the nausea medication every 6 hours as well as needed.  Please take the entire course of antibiotics that I prescribed.  Please follow-up if you have worsening abdominal pain, or severe fever despite treatment.
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
## 2023-10-30 ENCOUNTER — Emergency Department (HOSPITAL_COMMUNITY)

## 2023-10-30 ENCOUNTER — Encounter (HOSPITAL_COMMUNITY): Payer: Self-pay | Admitting: Emergency Medicine

## 2023-10-30 ENCOUNTER — Emergency Department (HOSPITAL_COMMUNITY)
Admission: EM | Admit: 2023-10-30 | Discharge: 2023-10-30 | Disposition: A | Discharge status: Home / Self Care | Attending: Emergency Medicine | Admitting: Emergency Medicine

## 2023-10-30 ENCOUNTER — Other Ambulatory Visit: Payer: Self-pay

## 2023-10-30 DIAGNOSIS — K5900 Constipation, unspecified: Secondary | ICD-10-CM | POA: Diagnosis not present

## 2023-10-30 DIAGNOSIS — R1030 Lower abdominal pain, unspecified: Secondary | ICD-10-CM | POA: Diagnosis present

## 2023-10-30 DIAGNOSIS — K5792 Diverticulitis of intestine, part unspecified, without perforation or abscess without bleeding: Secondary | ICD-10-CM | POA: Insufficient documentation

## 2023-10-30 LAB — CBC
HCT: 40.3 % (ref 36.0–46.0)
Hemoglobin: 13.9 g/dL (ref 12.0–15.0)
MCH: 30.1 pg (ref 26.0–34.0)
MCHC: 34.5 g/dL (ref 30.0–36.0)
MCV: 87.2 fL (ref 80.0–100.0)
Platelets: 433 10*3/uL — ABNORMAL HIGH (ref 150–400)
RBC: 4.62 MIL/uL (ref 3.87–5.11)
RDW: 14.1 % (ref 11.5–15.5)
WBC: 10.9 10*3/uL — ABNORMAL HIGH (ref 4.0–10.5)
nRBC: 0 % (ref 0.0–0.2)

## 2023-10-30 LAB — COMPREHENSIVE METABOLIC PANEL WITH GFR
ALT: 68 U/L — ABNORMAL HIGH (ref 0–44)
AST: 44 U/L — ABNORMAL HIGH (ref 15–41)
Albumin: 3.3 g/dL — ABNORMAL LOW (ref 3.5–5.0)
Alkaline Phosphatase: 75 U/L (ref 38–126)
Anion gap: 7 (ref 5–15)
BUN: 5 mg/dL — ABNORMAL LOW (ref 6–20)
CO2: 23 mmol/L (ref 22–32)
Calcium: 8.8 mg/dL — ABNORMAL LOW (ref 8.9–10.3)
Chloride: 106 mmol/L (ref 98–111)
Creatinine, Ser: 0.62 mg/dL (ref 0.44–1.00)
GFR, Estimated: >60 mL/min (ref >60)
Glucose, Bld: 164 mg/dL — ABNORMAL HIGH (ref 70–99)
Potassium: 3.3 mmol/L — ABNORMAL LOW (ref 3.5–5.1)
Sodium: 136 mmol/L (ref 135–145)
Total Bilirubin: 0.6 mg/dL (ref 0.0–1.2)
Total Protein: 7.3 g/dL (ref 6.5–8.1)

## 2023-10-30 LAB — URINALYSIS, ROUTINE W REFLEX MICROSCOPIC
Bilirubin Urine: NEGATIVE
Glucose, UA: NEGATIVE mg/dL
Ketones, ur: NEGATIVE mg/dL
Leukocytes,Ua: NEGATIVE
Nitrite: NEGATIVE
Protein, ur: 100 mg/dL
RBC / HPF: >50 RBC/hpf (ref 0–5)
Specific Gravity, Urine: 1.028 (ref 1.005–1.030)
pH: 6 (ref 5.0–8.0)

## 2023-10-30 LAB — LIPASE, BLOOD: Lipase: 25 U/L (ref 11–51)

## 2023-10-30 LAB — HCG, SERUM, QUALITATIVE: Preg, Serum: NEGATIVE

## 2023-10-30 MED ORDER — SODIUM CHLORIDE 0.9 % IV BOLUS
1000.0000 mL | Freq: Once | INTRAVENOUS | Status: AC
  Administered 2023-10-30: 1000 mL via INTRAVENOUS

## 2023-10-30 MED ORDER — ONDANSETRON HCL 4 MG/2ML IJ SOLN
4.0000 mg | Freq: Once | INTRAMUSCULAR | Status: AC
  Administered 2023-10-30: 4 mg via INTRAVENOUS
  Filled 2023-10-30: qty 2

## 2023-10-30 MED ORDER — IOHEXOL 350 MG/ML SOLN
75.0000 mL | Freq: Once | INTRAVENOUS | Status: AC | PRN
  Administered 2023-10-30: 75 mL via INTRAVENOUS

## 2023-10-30 MED ORDER — OXYCODONE-ACETAMINOPHEN 5-325 MG PO TABS
2.0000 | ORAL_TABLET | Freq: Once | ORAL | Status: AC
  Administered 2023-10-30: 2 via ORAL
  Filled 2023-10-30: qty 2

## 2023-10-30 MED ORDER — PIPERACILLIN-TAZOBACTAM 3.375 G IVPB 30 MIN
3.3750 g | Freq: Once | INTRAVENOUS | Status: AC
  Administered 2023-10-30: 3.375 g via INTRAVENOUS
  Filled 2023-10-30: qty 50

## 2023-10-30 MED ORDER — OXYCODONE-ACETAMINOPHEN 5-325 MG PO TABS: 1.0000 | ORAL_TABLET | Freq: Four times a day (QID) | ORAL | 0 refills | Status: DC | PRN

## 2023-10-30 MED ORDER — AMOXICILLIN-POT CLAVULANATE 875-125 MG PO TABS: 1.0000 | ORAL_TABLET | Freq: Two times a day (BID) | ORAL | 0 refills | Status: DC

## 2023-10-30 NOTE — ED Notes (Signed)
Pt given chicken broth 

## 2023-10-30 NOTE — Discharge Instructions (Addendum)
 As we discussed, you have diverticulitis.  I have prescribed Augmentin  twice a day for a week.  I have also prescribed Percocet for severe pain  Since you have 2 episodes of diverticulitis this year, I recommended you follow-up with GI.    I have sent in a referral for GI and they should contact you next week  Return to ER if you have severe abdominal pain or vomiting or dehydration

## 2023-10-30 NOTE — ED Triage Notes (Signed)
 Pt reports constipation x1 week. Diagnosed with diverticulitis last month and completed meds. Endorses lower abd pain. Emesis x2 days and decreased appetite due to the pain.

## 2023-10-30 NOTE — ED Provider Notes (Signed)
 [place] EMERGENCY DEPARTMENT AT [facility] Provider Note   CSN: [id number] Arrival date & time: 10/30/23  1549     History  Chief Complaint  Patient presents with   Constipation    Patricia Drake is a 38 y.o. female who presented with lower abdominal pain and constipation.  Patient states that she had lower abdominal pain for about a week or so.  Patient had diverticulitis about a month ago and came to the ER and finished a course of antibiotics.  Patient states that she has been constipated for a week and tried MiraLAX and stool softeners with minimal relief.  Patient also started vomiting for 2 days.  Denies any fevers.  Denies any pain with urination.  The history is provided by the patient.       Home Medications Prior to Admission medications   Medication Sig Start Date End Date Taking? Authorizing Provider  albuterol  (VENTOLIN  HFA) 108 (90 Base) MCG/ACT inhaler Inhale 2 puffs into the lungs 4 (four) times daily as needed for wheezing or shortness of breath. 01/28/21   [provider]  amoxicillin -clavulanate (AUGMENTIN ) 875-125 MG tablet Take 1 tablet by mouth every 12 (twelve) hours. 09/20/23   Prosperi, Christian H, PA-C  cyclobenzaprine  (FLEXERIL ) 10 MG tablet Take 10 mg by mouth at bedtime.    [provider]  dicyclomine  (BENTYL ) 20 MG tablet Take 1 tablet (20 mg total) by mouth 3 (three) times daily as needed (abdominal pain/cramping). 10/23/21   Solon Dura, MD  divalproex  (DEPAKOTE ) 500 MG DR tablet Take 1 tablet (500 mg total) by mouth every 12 (twelve) hours. 07/24/21 08/23/21  Basilia Bosworth, MD  fluticasone-salmeterol (ADVAIR) 100-50 MCG/ACT AEPB Inhale 1 puff into the lungs 2 (two) times daily.    [provider]  levETIRAcetam  (KEPPRA ) 750 MG tablet Take 1 tablet (750 mg total) by mouth 2 (two) times daily. 07/20/21   Haviland, Julie, MD  metroNIDAZOLE  (FLAGYL ) 500 MG tablet Take 1 tablet (500 mg total) by mouth 2 (two)  times daily. 09/20/23   Prosperi, Christian H, PA-C  mirtazapine  (REMERON ) 15 MG tablet Take 15 mg by mouth at bedtime.    [provider]  omeprazole (PRILOSEC) 20 MG capsule Take 20 mg by mouth daily. 01/28/21   [provider]  ondansetron  (ZOFRAN -ODT) 4 MG disintegrating tablet Take 1 tablet (4 mg total) by mouth every 6 (six) hours as needed for nausea or vomiting. 09/20/23   Prosperi, Christian H, PA-C  oxyCODONE -acetaminophen  (PERCOCET/ROXICET) 5-325 MG tablet Take 1 tablet by mouth every 6 (six) hours as needed for severe pain (pain score 7-10). 09/20/23   Prosperi, Christian H, PA-C  prazosin  (MINIPRESS ) 1 MG capsule Take 1 mg by mouth at bedtime.    [provider]  phenytoin  (DILANTIN ) 100 MG ER capsule Take 1 capsule (100 mg total) by mouth 3 (three) times daily. Patient not taking: Reported on 09/27/2019 01/01/19 09/28/19  Alissa April, MD      Allergies    Sulfa antibiotics    Review of Systems   Review of Systems  Gastrointestinal:  Positive for abdominal pain, constipation and vomiting.  All other systems reviewed and are negative.   Physical Exam Updated Vital Signs BP (!) 138/91   Pulse 90   Temp 98.3 F (36.8 C) (Oral)   Resp 15   Ht 5\' 2"  (1.575 m)   Wt 126 kg   LMP 10/30/2023 (Approximate)   SpO2 100%   BMI 50.81 kg/m  Physical Exam Vitals and nursing note reviewed.  Constitutional:      Appearance: Normal appearance.  HENT:     Head: Normocephalic.     Nose: Nose normal.     Mouth/Throat:     Mouth: Mucous membranes are dry.  Eyes:     Extraocular Movements: Extraocular movements intact.     Pupils: Pupils are equal, round, and reactive to light.  Cardiovascular:     Rate and Rhythm: Normal rate and regular rhythm.     Pulses: Normal pulses.     Heart sounds: Normal heart sounds.  Pulmonary:     Effort: Pulmonary effort is normal.     Breath sounds: Normal breath sounds.  Abdominal:     Comments: Mild diffuse lower abdominal  tenderness.  Musculoskeletal:        General: Normal range of motion.     Cervical back: Normal range of motion and neck supple.  Skin:    Capillary Refill: Capillary refill takes less than 2 seconds.  Neurological:     General: No focal deficit present.     Mental Status: She is alert and oriented to person, place, and time.  Psychiatric:        Mood and Affect: Mood normal.        Behavior: Behavior normal.     ED Results / Procedures / Treatments   Labs (all labs ordered are listed, but only abnormal results are displayed) Labs Reviewed  COMPREHENSIVE METABOLIC PANEL WITH GFR - Abnormal; Notable for the following components:      Result Value   Potassium 3.3 (*)    Glucose, Bld 164 (*)    BUN 5 (*)    Calcium 8.8 (*)    Albumin 3.3 (*)    AST 44 (*)    ALT 68 (*)    All other components within normal limits  CBC - Abnormal; Notable for the following components:   WBC 10.9 (*)    Platelets 433 (*)    All other components within normal limits  URINALYSIS, ROUTINE W REFLEX MICROSCOPIC - Abnormal; Notable for the following components:   Color, Urine RED (*)    APPearance CLOUDY (*)    Hgb urine dipstick LARGE (*)    Bacteria, UA RARE (*)    All other components within normal limits  LIPASE, BLOOD  HCG, SERUM, QUALITATIVE    EKG None  Radiology No results found.  Procedures Procedures    Medications Ordered in ED Medications  sodium chloride  0.9 % bolus 1,000 mL (1,000 mLs Intravenous New Bag/Given 10/30/23 1701)  ondansetron  (ZOFRAN ) injection 4 mg (4 mg Intravenous Given 10/30/23 1700)    ED Course/ Medical Decision Making/ A&P                                 Medical Decision Making Patricia Drake is a 38 y.o. female here with lower abdominal pain.  Concern for possible diverticulitis versus diverticular abscess versus kidney stone versus constipation.  Plan to get CBC CMP and UA and CT abdomen pelvis.   10:38 PM Reviewed patient's labs and white  blood cell count is 10.  UA showed blood and CT showed uncomplicated diverticulitis.  Given the patient has 2 episodes of diverticulitis within the month, I recommend that she follow-up with GI for colonoscopy.  Patient given Zosyn.  Will discharge home with Augmentin  and pain medicine.  Problems Addressed: Diverticulitis: acute  illness or injury  Amount and/or Complexity of Data Reviewed Labs: ordered. Decision-making details documented in ED Course. Radiology: ordered and independent interpretation performed. Decision-making details documented in ED Course.  Risk Prescription drug management.   Final Clinical Impression(s) / ED Diagnoses Final diagnoses:  None    Rx / DC Orders ED Discharge Orders     None         Dalene Duck, MD 10/30/23 2239

## 2023-11-11 ENCOUNTER — Encounter: Payer: Self-pay | Admitting: Physician Assistant

## 2023-11-21 ENCOUNTER — Emergency Department (HOSPITAL_COMMUNITY)
Admission: EM | Admit: 2023-11-21 | Discharge: 2023-11-21 | Disposition: A | Discharge status: Home / Self Care | Attending: Emergency Medicine | Admitting: Emergency Medicine

## 2023-11-21 ENCOUNTER — Emergency Department (HOSPITAL_COMMUNITY)

## 2023-11-21 ENCOUNTER — Other Ambulatory Visit: Payer: Self-pay

## 2023-11-21 ENCOUNTER — Encounter (HOSPITAL_COMMUNITY): Payer: Self-pay

## 2023-11-21 DIAGNOSIS — K5732 Diverticulitis of large intestine without perforation or abscess without bleeding: Secondary | ICD-10-CM | POA: Insufficient documentation

## 2023-11-21 DIAGNOSIS — R7989 Other specified abnormal findings of blood chemistry: Secondary | ICD-10-CM | POA: Diagnosis not present

## 2023-11-21 DIAGNOSIS — D72829 Elevated white blood cell count, unspecified: Secondary | ICD-10-CM | POA: Insufficient documentation

## 2023-11-21 DIAGNOSIS — K5792 Diverticulitis of intestine, part unspecified, without perforation or abscess without bleeding: Secondary | ICD-10-CM

## 2023-11-21 DIAGNOSIS — R1031 Right lower quadrant pain: Secondary | ICD-10-CM | POA: Diagnosis present

## 2023-11-21 LAB — CBC
HCT: 42.2 % (ref 36.0–46.0)
Hemoglobin: 14 g/dL (ref 12.0–15.0)
MCH: 29.7 pg (ref 26.0–34.0)
MCHC: 33.2 g/dL (ref 30.0–36.0)
MCV: 89.4 fL (ref 80.0–100.0)
Platelets: 395 10*3/uL (ref 150–400)
RBC: 4.72 MIL/uL (ref 3.87–5.11)
RDW: 14.6 % (ref 11.5–15.5)
WBC: 15 10*3/uL — ABNORMAL HIGH (ref 4.0–10.5)
nRBC: 0 % (ref 0.0–0.2)

## 2023-11-21 LAB — URINALYSIS, ROUTINE W REFLEX MICROSCOPIC
Bilirubin Urine: NEGATIVE
Glucose, UA: NEGATIVE mg/dL
Hgb urine dipstick: NEGATIVE
Ketones, ur: NEGATIVE mg/dL
Leukocytes,Ua: NEGATIVE
Nitrite: NEGATIVE
Protein, ur: NEGATIVE mg/dL
Specific Gravity, Urine: 1.021 (ref 1.005–1.030)
pH: 6 (ref 5.0–8.0)

## 2023-11-21 LAB — COMPREHENSIVE METABOLIC PANEL WITH GFR
ALT: 76 U/L — ABNORMAL HIGH (ref 0–44)
AST: 52 U/L — ABNORMAL HIGH (ref 15–41)
Albumin: 3.8 g/dL (ref 3.5–5.0)
Alkaline Phosphatase: 84 U/L (ref 38–126)
Anion gap: 10 (ref 5–15)
BUN: 7 mg/dL (ref 6–20)
CO2: 21 mmol/L — ABNORMAL LOW (ref 22–32)
Calcium: 8.9 mg/dL (ref 8.9–10.3)
Chloride: 104 mmol/L (ref 98–111)
Creatinine, Ser: 0.53 mg/dL (ref 0.44–1.00)
GFR, Estimated: >60 mL/min (ref >60)
Glucose, Bld: 100 mg/dL — ABNORMAL HIGH (ref 70–99)
Potassium: 3.8 mmol/L (ref 3.5–5.1)
Sodium: 135 mmol/L (ref 135–145)
Total Bilirubin: 0.9 mg/dL (ref 0.0–1.2)
Total Protein: 8.3 g/dL — ABNORMAL HIGH (ref 6.5–8.1)

## 2023-11-21 LAB — LIPASE, BLOOD: Lipase: 25 U/L (ref 11–51)

## 2023-11-21 LAB — HCG, SERUM, QUALITATIVE: Preg, Serum: NEGATIVE

## 2023-11-21 MED ORDER — CIPROFLOXACIN HCL 500 MG PO TABS: 500.0000 mg | ORAL_TABLET | Freq: Two times a day (BID) | ORAL | 0 refills | Status: AC

## 2023-11-21 MED ORDER — METAMUCIL SMOOTH TEXTURE 58.6 % PO POWD: 1.0000 | Freq: Three times a day (TID) | ORAL | 12 refills | Status: AC

## 2023-11-21 MED ORDER — SENNOSIDES-DOCUSATE SODIUM 8.6-50 MG PO TABS: 1.0000 | ORAL_TABLET | Freq: Every day | ORAL | 0 refills | Status: AC

## 2023-11-21 MED ORDER — ONDANSETRON HCL 4 MG PO TABS: 4.0000 mg | ORAL_TABLET | Freq: Four times a day (QID) | ORAL | 0 refills | Status: DC

## 2023-11-21 MED ORDER — OXYCODONE HCL 5 MG PO TABS: 5.0000 mg | ORAL_TABLET | Freq: Four times a day (QID) | ORAL | 0 refills | Status: AC | PRN

## 2023-11-21 MED ORDER — METRONIDAZOLE 500 MG PO TABS: 500.0000 mg | ORAL_TABLET | Freq: Two times a day (BID) | ORAL | 0 refills | Status: AC

## 2023-11-21 MED ORDER — MORPHINE SULFATE (PF) 4 MG/ML IV SOLN
4.0000 mg | Freq: Once | INTRAVENOUS | Status: AC
  Administered 2023-11-21: 4 mg via INTRAVENOUS
  Filled 2023-11-21: qty 1

## 2023-11-21 MED ORDER — POLYETHYLENE GLYCOL 3350 17 G PO PACK: 17.0000 g | PACK | Freq: Every day | ORAL | 0 refills | Status: AC

## 2023-11-21 MED ORDER — ONDANSETRON HCL 4 MG/2ML IJ SOLN
4.0000 mg | Freq: Once | INTRAMUSCULAR | Status: AC
  Administered 2023-11-21: 4 mg via INTRAVENOUS
  Filled 2023-11-21: qty 2

## 2023-11-21 MED ORDER — IOHEXOL 300 MG/ML  SOLN
100.0000 mL | Freq: Once | INTRAMUSCULAR | Status: AC | PRN
  Administered 2023-11-21: 100 mL via INTRAVENOUS

## 2023-11-21 MED ORDER — CIPROFLOXACIN HCL 500 MG PO TABS
500.0000 mg | ORAL_TABLET | Freq: Once | ORAL | Status: AC
  Administered 2023-11-21: 500 mg via ORAL
  Filled 2023-11-21: qty 1

## 2023-11-21 MED ORDER — METRONIDAZOLE 500 MG PO TABS
500.0000 mg | ORAL_TABLET | Freq: Once | ORAL | Status: AC
  Administered 2023-11-21: 500 mg via ORAL
  Filled 2023-11-21: qty 1

## 2023-11-21 NOTE — Discharge Instructions (Addendum)
 You were seen in the emergency department for abdominal pain.  You have recurrence of your diverticulitis however had no signs of complications from your diverticulitis.  I have given you a course of antibiotics and should complete this as prescribed.  You can take Tylenol  every 6 hours as needed for pain and I have given you oxycodone  for breakthrough pain.  This can make you drowsy so do not take it before driving, working or operating heavy machinery.  You can take Zofran  as needed for nausea.  Once your pain is starting to improve you should start a bowel regimen.  You should take Metamucil which is a fiber supplement every day and docusate senna daily which is a stool softener.  You should take MiraLAX every day until you are wearing regular soft bowel movements, and then you can cut the dose in half until you are again having regular soft bowel movements and then you can take as needed.  You should follow-up with your primary doctor in the next few days to have your symptoms rechecked and should follow-up with GI next month as scheduled.  We should return to the emergency department if you are having fevers despite the antibiotics, significantly worsening pain, repetitive vomiting despite the nausea medicine or any other new or concerning symptoms.

## 2023-11-21 NOTE — ED Provider Notes (Signed)
 [place] [facility]MENT AT [facility] Provider Note   CSN: [id number] Arrival date & time: 11/21/23  1420     History  Chief Complaint  Patient presents with   Abdominal Pain   Emesis    Patricia Drake is a 38 y.o. female.  Patient is a 38 year old female with PMH depression and substance use presenting to the emergency department with abdominal pain. Patient reports she woke up around 3AM with lower abdominal pain and started to have nausea and vomiting this morning. She states she has had 2 recent episodes of diverticulitis and this feels similar. She did complete her antibiotics as prescribed and previous symptoms did resolve. She states she has chronic constipation and has had a small bowel movement today, non-black or blood. Denies any dysuria or hematuria. States she is scheduled to see GI in July.  The history is provided by the patient.  Abdominal Pain Associated symptoms: vomiting   Emesis Associated symptoms: abdominal pain        Home Medications Prior to Admission medications   Medication Sig Start Date End Date Taking? Authorizing Provider  ciprofloxacin (CIPRO) 500 MG tablet Take 1 tablet (500 mg total) by mouth every 12 (twelve) hours for 7 days. 11/21/23 11/28/23 Yes Nora Beal, Mccormick Macon K, DO  metroNIDAZOLE  (FLAGYL ) 500 MG tablet Take 1 tablet (500 mg total) by mouth 2 (two) times daily. 11/21/23  Yes Kingsley, Martavia Tye K, DO  ondansetron  (ZOFRAN ) 4 MG tablet Take 1 tablet (4 mg total) by mouth every 6 (six) hours. 11/21/23  Yes Nora Beal, Shatima Zalar K, DO  oxyCODONE  (ROXICODONE ) 5 MG immediate release tablet Take 1 tablet (5 mg total) by mouth every 6 (six) hours as needed. 11/21/23  Yes Nora Beal, Helix Lafontaine K, DO  polyethylene glycol (MIRALAX) 17 g packet Take 17 g by mouth daily. 11/21/23  Yes Nora Beal, Kaydon Husby K, DO  psyllium (METAMUCIL SMOOTH TEXTURE) 58.6 % powder Take 1 packet by mouth 3 (three) times daily. 11/21/23  Yes Nora Beal, Nate Perri K, DO   senna-docusate (SENOKOT-S) 8.6-50 MG tablet Take 1 tablet by mouth daily. 11/21/23  Yes Nora Beal, Michaelle Bottomley K, DO  albuterol  (VENTOLIN  HFA) 108 (90 Base) MCG/ACT inhaler Inhale 2 puffs into the lungs 4 (four) times daily as needed for wheezing or shortness of breath. 01/28/21   [provider]  amoxicillin -clavulanate (AUGMENTIN ) 875-125 MG tablet Take 1 tablet by mouth every 12 (twelve) hours. 09/20/23   Prosperi, Christian H, PA-C  amoxicillin -clavulanate (AUGMENTIN ) 875-125 MG tablet Take 1 tablet by mouth every 12 (twelve) hours. 10/30/23   Dalene Duck, MD  cyclobenzaprine  (FLEXERIL ) 10 MG tablet Take 10 mg by mouth at bedtime.    [provider]  dicyclomine  (BENTYL ) 20 MG tablet Take 1 tablet (20 mg total) by mouth 3 (three) times daily as needed (abdominal pain/cramping). 10/23/21   Solon Dura, MD  divalproex  (DEPAKOTE ) 500 MG DR tablet Take 1 tablet (500 mg total) by mouth every 12 (twelve) hours. 07/24/21 08/23/21  Pashayan, Knute Perla, DO  fluticasone-salmeterol (ADVAIR) 100-50 MCG/ACT AEPB Inhale 1 puff into the lungs 2 (two) times daily.    [provider]  levETIRAcetam  (KEPPRA ) 750 MG tablet Take 1 tablet (750 mg total) by mouth 2 (two) times daily. 07/20/21   Haviland, Julie, MD  mirtazapine  (REMERON ) 15 MG tablet Take 15 mg by mouth at bedtime.    [provider]  omeprazole (PRILOSEC) 20 MG capsule Take 20 mg by mouth daily. 01/28/21   [provider]  ondansetron  (ZOFRAN -ODT) 4 MG disintegrating tablet Take 1 tablet (4 mg total) by mouth every 6 (six) hours as needed for nausea or vomiting. 09/20/23   Prosperi, Christian H, PA-C  prazosin  (MINIPRESS ) 1 MG capsule Take 1 mg by mouth at bedtime.    [provider]  phenytoin  (DILANTIN ) 100 MG ER capsule Take 1 capsule (100 mg total) by mouth 3 (three) times daily. Patient not taking: Reported on 09/27/2019 01/01/19 09/28/19  Alissa April, MD      Allergies    Sulfa antibiotics     Review of Systems   Review of Systems  Gastrointestinal:  Positive for abdominal pain and vomiting.    Physical Exam Updated Vital Signs BP 124/76   Pulse 83   Temp 98.3 F (36.8 C) (Oral)   Resp 18   LMP 10/30/2023 (Approximate)   SpO2 98%  Physical Exam Vitals and nursing note reviewed.  Constitutional:      General: She is not in acute distress.    Appearance: She is well-developed. She is obese.  HENT:     Head: Normocephalic.     Mouth/Throat:     Mouth: Mucous membranes are moist.  Eyes:     Extraocular Movements: Extraocular movements intact.  Cardiovascular:     Rate and Rhythm: Normal rate and regular rhythm.     Heart sounds: Normal heart sounds.  Pulmonary:     Effort: Pulmonary effort is normal.     Breath sounds: Normal breath sounds.  Abdominal:     General: Abdomen is flat.     Palpations: Abdomen is soft.     Tenderness: There is abdominal tenderness in the right lower quadrant and suprapubic area. There is no guarding. Negative signs include Murphy's sign.  Skin:    General: Skin is warm and dry.  Neurological:     General: No focal deficit present.     Mental Status: She is alert and oriented to person, place, and time.  Psychiatric:        Mood and Affect: Mood normal.        Behavior: Behavior normal.     ED Results / Procedures / Treatments   Labs (all labs ordered are listed, but only abnormal results are displayed) Labs Reviewed  COMPREHENSIVE METABOLIC PANEL WITH GFR - Abnormal; Notable for the following components:      Result Value   CO2 21 (*)    Glucose, Bld 100 (*)    Total Protein 8.3 (*)    AST 52 (*)    ALT 76 (*)    All other components within normal limits  CBC - Abnormal; Notable for the following components:   WBC 15.0 (*)    All other components within normal limits  LIPASE, BLOOD  URINALYSIS, ROUTINE W REFLEX MICROSCOPIC  HCG, SERUM, QUALITATIVE    EKG None  Radiology CT ABDOMEN PELVIS W CONTRAST Result  Date: 11/21/2023 CLINICAL DATA:  Right lower quadrant pain EXAM: CT ABDOMEN AND PELVIS WITH CONTRAST TECHNIQUE: Multidetector CT imaging of the abdomen and pelvis was performed using the standard protocol following bolus administration of intravenous contrast. RADIATION DOSE REDUCTION: This exam was performed according to the departmental dose-optimization program which includes automated exposure control, adjustment of the mA and/or kV according to patient size and/or use of iterative reconstruction technique. CONTRAST:  OMNIPAQUE  IOHEXOL  300 MG/ML  SOLN COMPARISON:  10/30/2023 FINDINGS: Lower chest: No acute abnormality.  Scarring at the right lung base. Hepatobiliary: Diffuse low-density throughout the liver  compatible with fatty infiltration. No focal abnormality. Gallbladder unremarkable. Pancreas: No focal abnormality or ductal dilatation. Spleen: No focal abnormality.  Normal size. Adrenals/Urinary Tract: Adrenal glands normal. Benign appearing 1 cm cyst in the lower pole of the right kidney is unchanged. No follow-up imaging recommended. No stones or hydronephrosis. Urinary bladder decompressed, grossly unremarkable. Stomach/Bowel: Sigmoid diverticulosis. Inflammatory stranding noted around the sigmoid colon compatible with diverticulitis. Findings are similar to prior study. Again noted are a few mildly prominent adjacent mesenteric lymph nodes. Normal appendix. Stomach and small bowel decompressed. No bowel obstruction. Vascular/Lymphatic: No evidence of aneurysm or adenopathy. Reproductive: Uterus and adnexa unremarkable.  No mass. Other: No free fluid or free air. Musculoskeletal: No acute bony abnormality. IMPRESSION: Sigmoid diverticulosis. Surrounding inflammatory stranding again noted similar to prior study compatible with continued active diverticulitis, not significantly changed. Recommend continued follow-up to resolution. Given the small adjacent mesenteric lymph nodes, consider further  evaluation with colonoscopy after resolution of acute symptoms to exclude underlying malignancy. Electronically Signed   By: Janeece Mechanic M.D.   On: 11/21/2023 18:04    Procedures Procedures    Medications Ordered in ED Medications  ondansetron  (ZOFRAN ) injection 4 mg (4 mg Intravenous Given 11/21/23 1631)  morphine  (PF) 4 MG/ML injection 4 mg (4 mg Intravenous Given 11/21/23 1631)  iohexol  (OMNIPAQUE ) 300 MG/ML solution 100 mL (100 mLs Intravenous Contrast Given 11/21/23 1730)  morphine  (PF) 4 MG/ML injection 4 mg (4 mg Intravenous Given 11/21/23 1719)  ondansetron  (ZOFRAN ) injection 4 mg (4 mg Intravenous Given 11/21/23 1718)  ciprofloxacin (CIPRO) tablet 500 mg (500 mg Oral Given 11/21/23 1829)  metroNIDAZOLE  (FLAGYL ) tablet 500 mg (500 mg Oral Given 11/21/23 1829)    ED Course/ Medical Decision Making/ A&P Clinical Course as of 11/21/23 1848  Sun Nov 21, 2023  1532 Labs with leukocytosis, mild transaminitis at baseline. [VK]  1811 CTAP with concern of continued diverticulitis. Was given Augmentin  previously and will try cipro/flagyl  today. She has GI follow up scheduled for next month and likely needs colonoscopy. Will additionally be given fiber supplement and bowel regimen once her symptoms have improved. [VK]    Clinical Course User Index [VK] Kingsley, Cordie Beazley K, DO                                 Medical Decision Making This patient presents to the ED with chief complaint(s) of abdominal pain with pertinent past medical history of diverticulitis, anxiety, depression, substance use which further complicates the presenting complaint. The complaint involves an extensive differential diagnosis and also carries with it a high risk of complications and morbidity.    The differential diagnosis includes diverticulitis, appendicitis, UTI, pregnancy, other intra-abdominal infection, acute complication of recent diverticulitis, nephrolithiasis   Additional history obtained: Additional history  obtained from family Records reviewed recent ED records  ED Course and Reassessment: On patient's arrival she is hemodynamically stable in no acute distress. Had labs initiated in triage that showed leukocytosis and is otherwise pending. She is tender across the lower abdomen. Will have repeat imaging to evaluate for acute infection or complication from recent diverticulitis. She was given pain and nausea control and will be closely reassessed.   Independent labs interpretation:  The following labs were independently interpreted: leukocytosis otherwise at baseline  Independent visualization of imaging: - I independently visualized the following imaging with scope of interpretation limited to determining acute life threatening conditions related to emergency care: CTAP ,  which revealed diverticulitis without complication  Consultation: - Consulted or discussed management/test interpretation w/ external professional: N/A  Consideration for admission or further workup: Patient has no emergent conditions requiring admission or further work-up at this time and is stable for discharge home with primary care and GI follow-up  Social Determinants of health: N/A    Amount and/or Complexity of Data Reviewed Labs: ordered. Radiology: ordered.  Risk OTC drugs. Prescription drug management.          Final Clinical Impression(s) / ED Diagnoses Final diagnoses:  Diverticulitis    Rx / DC Orders ED Discharge Orders          Ordered    ciprofloxacin (CIPRO) 500 MG tablet  Every 12 hours        11/21/23 1845    metroNIDAZOLE  (FLAGYL ) 500 MG tablet  2 times daily        11/21/23 1845    oxyCODONE  (ROXICODONE ) 5 MG immediate release tablet  Every 6 hours PRN        11/21/23 1845    psyllium (METAMUCIL SMOOTH TEXTURE) 58.6 % powder  3 times daily        11/21/23 1845    senna-docusate (SENOKOT-S) 8.6-50 MG tablet  Daily        11/21/23 1845    polyethylene glycol (MIRALAX) 17 g  packet  Daily        11/21/23 1845    ondansetron  (ZOFRAN ) 4 MG tablet  Every 6 hours        11/21/23 1846              Kingsley, Roma Bondar K, DO 11/21/23 1848

## 2023-11-21 NOTE — ED Triage Notes (Signed)
 Pt arrives via POV. PT c/o abdominal pain, nausea, and vomiting since yesterday. PT states she may have diverticulitis again. PT AxOx4.

## 2023-12-10 ENCOUNTER — Encounter (HOSPITAL_COMMUNITY): Payer: Self-pay

## 2023-12-10 ENCOUNTER — Emergency Department (HOSPITAL_COMMUNITY)
Admission: EM | Admit: 2023-12-10 | Discharge: 2023-12-11 | Disposition: A | Discharge status: Home / Self Care | Attending: Emergency Medicine | Admitting: Emergency Medicine

## 2023-12-10 ENCOUNTER — Other Ambulatory Visit: Payer: Self-pay

## 2023-12-10 DIAGNOSIS — R1032 Left lower quadrant pain: Secondary | ICD-10-CM | POA: Diagnosis present

## 2023-12-10 DIAGNOSIS — R109 Unspecified abdominal pain: Secondary | ICD-10-CM

## 2023-12-10 DIAGNOSIS — K5732 Diverticulitis of large intestine without perforation or abscess without bleeding: Secondary | ICD-10-CM | POA: Diagnosis not present

## 2023-12-10 DIAGNOSIS — Z87891 Personal history of nicotine dependence: Secondary | ICD-10-CM | POA: Diagnosis not present

## 2023-12-10 DIAGNOSIS — K5792 Diverticulitis of intestine, part unspecified, without perforation or abscess without bleeding: Secondary | ICD-10-CM

## 2023-12-10 LAB — CBC WITH DIFFERENTIAL/PLATELET
Abs Immature Granulocytes: 0.02 10*3/uL (ref 0.00–0.07)
Basophils Absolute: 0.1 10*3/uL (ref 0.0–0.1)
Basophils Relative: 1 %
Eosinophils Absolute: 0.5 10*3/uL (ref 0.0–0.5)
Eosinophils Relative: 5 %
HCT: 41.4 % (ref 36.0–46.0)
Hemoglobin: 13.7 g/dL (ref 12.0–15.0)
Immature Granulocytes: 0 %
Lymphocytes Relative: 31 %
Lymphs Abs: 3 10*3/uL (ref 0.7–4.0)
MCH: 29.4 pg (ref 26.0–34.0)
MCHC: 33.1 g/dL (ref 30.0–36.0)
MCV: 88.8 fL (ref 80.0–100.0)
Monocytes Absolute: 0.8 10*3/uL (ref 0.1–1.0)
Monocytes Relative: 9 %
Neutro Abs: 5.1 10*3/uL (ref 1.7–7.7)
Neutrophils Relative %: 54 %
Platelets: 498 10*3/uL — ABNORMAL HIGH (ref 150–400)
RBC: 4.66 MIL/uL (ref 3.87–5.11)
RDW: 14.1 % (ref 11.5–15.5)
WBC: 9.6 10*3/uL (ref 4.0–10.5)
nRBC: 0 % (ref 0.0–0.2)

## 2023-12-10 LAB — COMPREHENSIVE METABOLIC PANEL WITH GFR
ALT: 47 U/L — ABNORMAL HIGH (ref 0–44)
AST: 38 U/L (ref 15–41)
Albumin: 3.6 g/dL (ref 3.5–5.0)
Alkaline Phosphatase: 72 U/L (ref 38–126)
Anion gap: 10 (ref 5–15)
BUN: 9 mg/dL (ref 6–20)
CO2: 23 mmol/L (ref 22–32)
Calcium: 9.2 mg/dL (ref 8.9–10.3)
Chloride: 103 mmol/L (ref 98–111)
Creatinine, Ser: 0.67 mg/dL (ref 0.44–1.00)
GFR, Estimated: >60 mL/min (ref >60)
Glucose, Bld: 92 mg/dL (ref 70–99)
Potassium: 3.8 mmol/L (ref 3.5–5.1)
Sodium: 136 mmol/L (ref 135–145)
Total Bilirubin: 0.4 mg/dL (ref 0.0–1.2)
Total Protein: 7.7 g/dL (ref 6.5–8.1)

## 2023-12-10 LAB — URINALYSIS, ROUTINE W REFLEX MICROSCOPIC
Bilirubin Urine: NEGATIVE
Glucose, UA: NEGATIVE mg/dL
Hgb urine dipstick: NEGATIVE
Ketones, ur: NEGATIVE mg/dL
Leukocytes,Ua: NEGATIVE
Nitrite: NEGATIVE
Protein, ur: NEGATIVE mg/dL
Specific Gravity, Urine: 1.026 (ref 1.005–1.030)
pH: 6 (ref 5.0–8.0)

## 2023-12-10 LAB — LIPASE, BLOOD: Lipase: 27 U/L (ref 11–51)

## 2023-12-10 LAB — HCG, SERUM, QUALITATIVE: Preg, Serum: NEGATIVE

## 2023-12-10 NOTE — ED Triage Notes (Signed)
 Pt here for lower abd pain and vomiting for 2 days. Pt states she took abx recently.  Denies CP and SHOB.

## 2023-12-10 NOTE — ED Provider Triage Note (Signed)
 Emergency Medicine Provider Triage Evaluation Note  Patricia Drake , a 39 y.o. female  was evaluated in triage.  Pt complains of abd pain. LLQ pain since yesterday with nausea, vomiting, and bloody stool.  Increase urinary frequency.  Hx of recurrent diverticulitis.    Review of Systems  Positive: As above Negative: As above  Physical Exam  BP (!) 152/87 (BP Location: Right Arm)   Pulse 75   Temp 98.3 F (36.8 C)   Resp 15   Ht 5' 2 (1.575 m)   Wt 124.7 kg   LMP 11/28/2023 (Approximate)   SpO2 100%   BMI 50.30 kg/m  Gen:   Awake, no distress   Resp:  Normal effort  MSK:   Moves extremities without difficulty  Other:    Medical Decision Making  Medically screening exam initiated at 7:00 PM.  Appropriate orders placed.  Patricia Drake was informed that the remainder of the evaluation will be completed by another provider, this initial triage assessment does not replace that evaluation, and the importance of remaining in the ED until their evaluation is complete.     Patricia Colon, PA-C 12/10/23 1900

## 2023-12-11 ENCOUNTER — Emergency Department (HOSPITAL_COMMUNITY)

## 2023-12-11 MED ORDER — AMOXICILLIN-POT CLAVULANATE 875-125 MG PO TABS: 1.0000 | ORAL_TABLET | Freq: Two times a day (BID) | ORAL | 0 refills | Status: AC

## 2023-12-11 MED ORDER — DICYCLOMINE HCL 20 MG PO TABS: 20.0000 mg | ORAL_TABLET | Freq: Two times a day (BID) | ORAL | 0 refills | Status: AC

## 2023-12-11 MED ORDER — IOHEXOL 350 MG/ML SOLN
75.0000 mL | Freq: Once | INTRAVENOUS | Status: AC | PRN
  Administered 2023-12-11: 75 mL via INTRAVENOUS

## 2023-12-11 MED ORDER — ONDANSETRON HCL 4 MG PO TABS: 4.0000 mg | ORAL_TABLET | ORAL | 0 refills | Status: AC | PRN

## 2023-12-11 NOTE — Discharge Instructions (Addendum)
 It was a pleasure caring for you today in the emergency department.  Be sure to eat a very bland diet, drink plenty of liquids.  Avoid tobacco, alcohol, illicit drugs.  Please follow-up with your PCP for recheck. Please follow up with gastroenterology for colonoscopy.   Please return to the emergency department for any worsening or worrisome symptoms.
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
## 2023-12-31 ENCOUNTER — Other Ambulatory Visit

## 2023-12-31 ENCOUNTER — Ambulatory Visit (INDEPENDENT_AMBULATORY_CARE_PROVIDER_SITE_OTHER): Admitting: Physician Assistant

## 2023-12-31 ENCOUNTER — Encounter: Payer: Self-pay | Admitting: Physician Assistant

## 2023-12-31 VITALS — BP 110/76 | HR 76 | Ht 62.0 in | Wt 271.5 lb

## 2023-12-31 DIAGNOSIS — K5732 Diverticulitis of large intestine without perforation or abscess without bleeding: Secondary | ICD-10-CM | POA: Diagnosis not present

## 2023-12-31 DIAGNOSIS — R109 Unspecified abdominal pain: Secondary | ICD-10-CM | POA: Diagnosis not present

## 2023-12-31 LAB — COMPREHENSIVE METABOLIC PANEL WITH GFR
ALT: 53 U/L — ABNORMAL HIGH (ref 0–35)
AST: 36 U/L (ref 0–37)
Albumin: 4 g/dL (ref 3.5–5.2)
Alkaline Phosphatase: 85 U/L (ref 39–117)
BUN: 9 mg/dL (ref 6–23)
CO2: 25 meq/L (ref 19–32)
Calcium: 8.8 mg/dL (ref 8.4–10.5)
Chloride: 103 meq/L (ref 96–112)
Creatinine, Ser: 0.7 mg/dL (ref 0.40–1.20)
GFR: 110.75 mL/min (ref >60.00)
Glucose, Bld: 114 mg/dL — ABNORMAL HIGH (ref 70–99)
Potassium: 3.8 meq/L (ref 3.5–5.1)
Sodium: 137 meq/L (ref 135–145)
Total Bilirubin: 0.3 mg/dL (ref 0.2–1.2)
Total Protein: 7.6 g/dL (ref 6.0–8.3)

## 2023-12-31 LAB — CBC WITH DIFFERENTIAL/PLATELET
Basophils Absolute: 0 K/uL (ref 0.0–0.1)
Basophils Relative: 0.2 % (ref 0.0–3.0)
Eosinophils Absolute: 0.5 K/uL (ref 0.0–0.7)
Eosinophils Relative: 5.9 % — ABNORMAL HIGH (ref 0.0–5.0)
HCT: 38.6 % (ref 36.0–46.0)
Hemoglobin: 12.9 g/dL (ref 12.0–15.0)
Lymphocytes Relative: 35.6 % (ref 12.0–46.0)
Lymphs Abs: 3.1 K/uL (ref 0.7–4.0)
MCHC: 33.5 g/dL (ref 30.0–36.0)
MCV: 87.8 fl (ref 78.0–100.0)
Monocytes Absolute: 0.6 K/uL (ref 0.1–1.0)
Monocytes Relative: 6.9 % (ref 3.0–12.0)
Neutro Abs: 4.5 K/uL (ref 1.4–7.7)
Neutrophils Relative %: 51.4 % (ref 43.0–77.0)
Platelets: 421 K/uL — ABNORMAL HIGH (ref 150.0–400.0)
RBC: 4.39 Mil/uL (ref 3.87–5.11)
RDW: 15.2 % (ref 11.5–15.5)
WBC: 8.8 K/uL (ref 4.0–10.5)

## 2023-12-31 LAB — SEDIMENTATION RATE: Sed Rate: 35 mm/h — ABNORMAL HIGH (ref 0–20)

## 2023-12-31 MED ORDER — NA SULFATE-K SULFATE-MG SULF 17.5-3.13-1.6 GM/177ML PO SOLN: 1.0000 | Freq: Once | ORAL | 0 refills | Status: AC

## 2023-12-31 MED ORDER — HYOSCYAMINE SULFATE 0.125 MG PO TABS: 0.1250 mg | ORAL_TABLET | Freq: Four times a day (QID) | ORAL | 0 refills | Status: AC | PRN
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
## 2023-12-31 NOTE — Patient Instructions (Addendum)
 Your provider has requested that you go to the basement level for lab work before leaving today. Press B on the elevator. The lab is located at the first door on the left as you exit the elevator.  Your provider has requested that you have an abdominal x ray before leaving today. Please go to the basement floor to our Radiology department for the test.  Add on fiber supplement like BENEFIBER 1-2 x a day IF YOU ARE NOT IN A FLARE Can take LEVSIN at least 1-2 x a day for pain if needed.   Can do heating pad and can take tylenol  max of 3000mg  a day.  Can add on lidocaine  patches or voltern gel  Go to the ER if unable to pass gas, severe AB pain, unable to hold down food, any shortness of breath of chest pain.   During an Acute Flare-Up (Clear Liquid to Low-Fiber Diet) The goal is to reduce irritation and let your colon rest.  Day 1-3: Clear Liquid Diet Water   Broth (chicken, beef, or vegetable)  Clear juices (apple, white grape - avoid citrus)  Ice pops without pulp or seeds  Gelatin (no fruit or seeds)  Tea or coffee (no cream or dairy)  After Symptoms Improve: Low-Fiber Diet Gradually transition to low-fiber foods for easier digestion.  Sample Foods White rice, pasta, or plain white bread  Cooked or canned vegetables without skins or seeds (e.g., carrots, green beans, potatoes)  Eggs, fish, or poultry  Low-fiber cereals (like cornflakes)  Dairy (if tolerated)  Ripe bananas, melon, or canned fruit without seeds  Long-Term Maintenance: High-Fiber Diet (Once Fully Recovered) This helps prevent future flare-ups by keeping the bowel movements soft and regular.  High-Fiber Foods Fruits: Apples (peeled), pears, berries, prunes  Vegetables: Broccoli, spinach, zucchini, peas  Whole grains: Oatmeal, brown rice, quinoa, whole wheat bread  Legumes: Lentils, chickpeas, black beans (start slowly to avoid gas)  Nuts & seeds: Only if tolerated (research no longer restricts  them, but if you feel they cause a flare do not eat them)    Diverticulitis Diverticulitis is inflammation or infection of small pouches in your colon that form when you have a condition called diverticulosis. The pouches in your colon are called diverticula. Your colon, or large intestine, is where water  is absorbed and stool is formed. Complications of diverticulitis can include: Bleeding. Severe infection. Severe pain. Perforation of your colon. Obstruction of your colon.  What are the causes? Diverticulitis is caused by bacteria. Diverticulitis happens when stool becomes trapped in diverticula. This allows bacteria to grow in the diverticula, which can lead to inflammation and infection. What increases the risk? People with diverticulosis are at risk for diverticulitis. Eating a diet that does not include enough fiber from fruits and vegetables may make diverticulitis more likely to develop. What are the signs or symptoms? Symptoms of diverticulitis may include: Abdominal pain and tenderness. The pain is normally located on the left side of the abdomen, but may occur in other areas. Fever and chills. Bloating. Cramping. Nausea. Vomiting. Constipation. Diarrhea. Blood in your stool.  How is this diagnosed? Your health care provider will ask you about your medical history and do a physical exam. You may need to have tests done because many medical conditions can cause the same symptoms as diverticulitis. Tests may include: Blood tests. Urine tests. Imaging tests of the abdomen, including X-rays and CT scans.  When your condition is under control, your health care provider may recommend that you  have a colonoscopy. A colonoscopy can show how severe your diverticula are and whether something else is causing your symptoms. How is this treated? Most cases of diverticulitis are mild and can be treated at home. Treatment may include: Taking over-the-counter pain  medicines. Following a clear liquid diet. Taking antibiotic medicines by mouth for 7-10 days.  More severe cases may be treated at a hospital. Treatment may include: Not eating or drinking. Taking prescription pain medicine. Receiving antibiotic medicines through an IV tube. Receiving fluids and nutrition through an IV tube. Surgery.  Follow these instructions at home: Follow your health care provider's instructions carefully. Follow a full liquid diet or other diet as directed by your health care provider. After your symptoms improve, your health care provider may tell you to change your diet. He or she may recommend you eat a high-fiber diet. Fruits and vegetables are good sources of fiber. Fiber makes it easier to pass stool. Take fiber supplements or probiotics as directed by your health care provider. Only take medicines as directed by your health care provider. Keep all your follow-up appointments. Contact a health care provider if: Your pain does not improve. You have a hard time eating food. Your bowel movements do not return to normal. Get help right away if: Your pain becomes worse. Your symptoms do not get better. Your symptoms suddenly get worse. You have a fever. You have repeated vomiting. You have bloody or black, tarry stools. This information is not intended to replace advice given to you by your health care provider. Make sure you discuss any questions you have with your health care provider. Document Released: 03/11/2005 Document Revised: 11/07/2015 Document Reviewed: 04/26/2013 Elsevier Interactive Patient Education  2017 [employer].  You have been scheduled for a colonoscopy. Please follow written instructions given to you at your visit today.   If you use inhalers (even only as needed), please bring them with you on the day of your procedure.  DO NOT TAKE 7 DAYS PRIOR TO TEST- Trulicity (dulaglutide) Ozempic, Wegovy (semaglutide) Mounjaro  (tirzepatide) Bydureon Bcise (exanatide extended release)  DO NOT TAKE 1 DAY PRIOR TO YOUR TEST Rybelsus (semaglutide) Adlyxin (lixisenatide) Victoza (liraglutide) Byetta (exanatide) ___________________________________________________________________________  _______________________________________________________  If your blood pressure at your visit was 140/90 or greater, please contact your primary care physician to follow up on this.  _______________________________________________________  If you are age 110 or older, your body mass index should be between 23-30. Your Body mass index is 49.66 kg/m. If this is out of the aforementioned range listed, please consider follow up with your Primary Care Provider.  If you are age 49 or younger, your body mass index should be between 19-25. Your Body mass index is 49.66 kg/m. If this is out of the aformentioned range listed, please consider follow up with your Primary Care Provider.   ________________________________________________________  The [facility] providers would like to encourage you to use MYCHART to communicate with providers for non-urgent requests or questions.  Due to long hold times on the telephone, sending your provider a message by [facility] may be a faster and more efficient way to get a response.  Please allow 48 business hours for a response.  Please remember that this is for non-urgent requests.  _______________________________________________________

## 2024-01-03 ENCOUNTER — Ambulatory Visit (INDEPENDENT_AMBULATORY_CARE_PROVIDER_SITE_OTHER)
Admission: RE | Admit: 2024-01-03 | Discharge: 2024-01-03 | Disposition: A | Discharge status: Home / Self Care | Source: Ambulatory Visit | Attending: Physician Assistant | Admitting: Physician Assistant

## 2024-01-03 ENCOUNTER — Ambulatory Visit: Payer: Self-pay | Admitting: Physician Assistant

## 2024-01-03 DIAGNOSIS — R109 Unspecified abdominal pain: Secondary | ICD-10-CM | POA: Diagnosis not present

## 2024-01-03 DIAGNOSIS — K5732 Diverticulitis of large intestine without perforation or abscess without bleeding: Secondary | ICD-10-CM

## 2024-01-13 ENCOUNTER — Telehealth: Payer: Self-pay | Admitting: Physician Assistant
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
## 2024-01-13 NOTE — Telephone Encounter (Signed)
 Patent returning call in regards to weight . States updated weight is 275.8

## 2024-01-14 ENCOUNTER — Ambulatory Visit: Admitting: Pediatrics

## 2024-01-14 ENCOUNTER — Encounter: Payer: Self-pay | Admitting: Pediatrics

## 2024-01-14 VITALS — BP 121/76 | HR 62 | Temp 97.7°F | Resp 9 | Ht 62.0 in | Wt 269.6 lb

## 2024-01-14 DIAGNOSIS — R194 Change in bowel habit: Secondary | ICD-10-CM

## 2024-01-14 DIAGNOSIS — K648 Other hemorrhoids: Secondary | ICD-10-CM | POA: Diagnosis not present

## 2024-01-14 DIAGNOSIS — F1910 Other psychoactive substance abuse, uncomplicated: Secondary | ICD-10-CM | POA: Insufficient documentation

## 2024-01-14 DIAGNOSIS — K5730 Diverticulosis of large intestine without perforation or abscess without bleeding: Secondary | ICD-10-CM | POA: Diagnosis present

## 2024-01-14 DIAGNOSIS — K5732 Diverticulitis of large intestine without perforation or abscess without bleeding: Secondary | ICD-10-CM

## 2024-01-14 DIAGNOSIS — R109 Unspecified abdominal pain: Secondary | ICD-10-CM

## 2024-01-14 MED ORDER — SODIUM CHLORIDE 0.9 % IV SOLN: 500.0000 mL | Freq: Once | INTRAVENOUS | Status: DC

## 2024-01-14 NOTE — Op Note (Signed)
 [facility] Patient Name: Patricia Drake Procedure Date: 01/14/2024 2:33 PM MRN: [id number] Endoscopist: Inocente Hausen , MD, [id number] Age: 38 Referring MD:  Date of Birth: 06/04/1986 Gender: Female Account #: [id number] Procedure:                Colonoscopy Indications:              This is the patient's first colonoscopy, Rectal                            bleeding, Follow-up of diverticulitis, Change in                            bowel habits Medicines:                Monitored Anesthesia Care Procedure:                Pre-Anesthesia Assessment:                           - Prior to the procedure, a History and Physical                            was performed, and patient medications and                            allergies were reviewed. The patient's tolerance of                            previous anesthesia was also reviewed. The risks                            and benefits of the procedure and the sedation                            options and risks were discussed with the patient.                            All questions were answered, and informed consent                            was obtained. Prior Anticoagulants: The patient has                            taken no anticoagulant or antiplatelet agents. ASA                            Grade Assessment: III - A patient with severe                            systemic disease. After reviewing the risks and                            benefits, the patient was deemed in satisfactory  condition to undergo the procedure.                           After obtaining informed consent, the colonoscope                            was passed under direct vision. Throughout the                            procedure, the patient's blood pressure, pulse, and                            oxygen saturations were monitored continuously. The                            CF [id number] #[id number] was introduced through the  anus                            and advanced to the terminal ileum. The colonoscopy                            was performed without difficulty. The patient                            tolerated the procedure well. The quality of the                            bowel preparation was adequate. The terminal ileum,                            ileocecal valve, appendiceal orifice, and rectum                            were photographed. Scope In: 2:54:48 PM Scope Out: 3:14:31 PM Scope Withdrawal Time: 0 hours 15 minutes 40 seconds  Total Procedure Duration: 0 hours 19 minutes 43 seconds  Findings:                 The perianal and digital rectal examinations were                            normal. Pertinent negatives include normal                            sphincter tone and no palpable rectal lesions.                           A few small-mouthed diverticula were found in the                            sigmoid colon.                           The exam was otherwise normal throughout the  examined colon.                           The terminal ileum appeared normal.                           Internal hemorrhoids were found during retroflexion. Complications:            No immediate complications. Estimated blood loss:                            None. Estimated Blood Loss:     Estimated blood loss: none. Impression:               - Diverticulosis in the sigmoid colon.                           - The examined portion of the ileum was normal.                           - Internal hemorrhoids.                           - No specimens collected. Recommendation:           - Discharge patient to home (ambulatory).                           - The findings and recommendations were discussed                            with the patient's family.                           - Return to GI clinic in 2 months with Dr. Suzann                            or APP.                            - Patient has a contact number available for                            emergencies. The signs and symptoms of potential                            delayed complications were discussed with the                            patient. Return to normal activities tomorrow.                            Written discharge instructions were provided to the                            patient. Inocente Suzann, MD 01/14/2024 3:20:15 PM This report has been signed electronically.

## 2024-01-14 NOTE — Progress Notes (Signed)
 Vss nad trans to pacu

## 2024-01-14 NOTE — Patient Instructions (Signed)
 YOU HAD AN ENDOSCOPIC PROCEDURE TODAY AT THE [facility]:   Refer to the procedure report that was given to you for any specific questions about what was found during the examination.  If the procedure report does not answer your questions, please call your gastroenterologist to clarify.  If you requested that your care partner not be given the details of your procedure findings, then the procedure report has been included in a sealed envelope for you to review at your convenience later.  YOU SHOULD EXPECT: Some feelings of bloating in the abdomen. Passage of more gas than usual.  Walking can help get rid of the air that was put into your GI tract during the procedure and reduce the bloating. If you had a lower endoscopy (such as a colonoscopy or flexible sigmoidoscopy) you may notice spotting of blood in your stool or on the toilet paper. If you underwent a bowel prep for your procedure, you may not have a normal bowel movement for a few days.  Please Note:  You might notice some irritation and congestion in your nose or some drainage.  This is from the oxygen used during your procedure.  There is no need for concern and it should clear up in a day or so.  SYMPTOMS TO REPORT IMMEDIATELY:  Following lower endoscopy (colonoscopy or flexible sigmoidoscopy):  Excessive amounts of blood in the stool  Significant tenderness or worsening of abdominal pains  Swelling of the abdomen that is new, acute  Fever of 100F or higher   Resume previous diet Return to GI clinic in 2 months  For urgent or emergent issues, a gastroenterologist can be reached at any hour by calling [phone]. Do not use MyChart messaging for urgent concerns.    DIET:  We do recommend a small meal at first, but then you may proceed to your regular diet.  Drink plenty of fluids but you should avoid alcoholic beverages for 24 hours.  ACTIVITY:  You should plan to take it easy for the rest of today and you should  NOT DRIVE or use heavy machinery until tomorrow (because of the sedation medicines used during the test).    FOLLOW UP: Our staff will call the number listed on your records the next business day following your procedure.  We will call around 7:15- 8:00 am to check on you and address any questions or concerns that you may have regarding the information given to you following your procedure. If we do not reach you, we will leave a message.     If any biopsies were taken you will be contacted by phone or by letter within the next 1-3 weeks.  Please call us  at [phone] if you have not heard about the biopsies in 3 weeks.    SIGNATURES/CONFIDENTIALITY: You and/or your care partner have signed paperwork which will be entered into your electronic medical record.  These signatures attest to the fact that that the information above on your After Visit Summary has been reviewed and is understood.  Full responsibility of the confidentiality of this discharge information lies with you and/or your care-partner.

## 2024-01-17 ENCOUNTER — Telehealth: Payer: Self-pay

## 2024-01-17 NOTE — Telephone Encounter (Signed)
  Follow up Call-     01/14/2024    2:02 PM  Call back number  Post procedure Call Back phone  # [phone]  Permission to leave phone message Yes     Patient questions:  Do you have a fever, pain , or abdominal swelling? No. Pain Score  0 *  Have you tolerated food without any problems? Yes.    Have you been able to return to your normal activities? Yes.    Do you have any questions about your discharge instructions: Diet   No. Medications  No. Follow up visit  No.  Do you have questions or concerns about your Care? No.  Actions: * If pain score is 4 or above: No action needed, pain <4.

## 2024-01-18 NOTE — Telephone Encounter (Signed)
 Patient completed procedure in the [facility] on 01/14/24.
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
## 2024-03-20 ENCOUNTER — Encounter: Payer: Self-pay | Admitting: Pediatrics

## 2024-03-20 ENCOUNTER — Other Ambulatory Visit

## 2024-03-20 ENCOUNTER — Ambulatory Visit: Admitting: Pediatrics

## 2024-03-20 VITALS — BP 122/72 | HR 80 | Ht 62.0 in | Wt 281.0 lb

## 2024-03-20 DIAGNOSIS — K5900 Constipation, unspecified: Secondary | ICD-10-CM | POA: Diagnosis not present

## 2024-03-20 DIAGNOSIS — K760 Fatty (change of) liver, not elsewhere classified: Secondary | ICD-10-CM

## 2024-03-20 DIAGNOSIS — Z8719 Personal history of other diseases of the digestive system: Secondary | ICD-10-CM

## 2024-03-20 DIAGNOSIS — R109 Unspecified abdominal pain: Secondary | ICD-10-CM

## 2024-03-20 NOTE — Patient Instructions (Signed)
 Your provider has requested that you go to the basement level for lab work before leaving today. Press B on the elevator. The lab is located at the first door on the left as you exit the elevator.  Due to recent changes in healthcare laws, you may see the results of your imaging and laboratory studies on MyChart before your provider has had a chance to review them.  We understand that in some cases there may be results that are confusing or concerning to you. Not all laboratory results come back in the same time frame and the provider may be waiting for multiple results in order to interpret others.  Please give us  48 hours in order for your provider to thoroughly review all the results before contacting the office for clarification of your results.    You have been scheduled for a CT scan of the abdomen and pelvis at [facility], 1st floor Radiology. You are scheduled on Tuesday, 03/21/24 at 6 pm. You should arrive 2 hours and 15 minutes prior to your appointment time for registration and to begin drinking your oral contrast.    Please follow the written instructions below on the day of your exam:   1) Do not eat anything after 2:00 pm (4 hours prior to your test)    You may take any medications as prescribed with a small amount of water , if necessary. If you take any of the following medications: METFORMIN, GLUCOPHAGE, GLUCOVANCE, AVANDAMET, RIOMET, FORTAMET, ACTOPLUS MET, JANUMET, GLUMETZA or METAGLIP, you MAY be asked to HOLD this medication 48 hours AFTER the exam.   The purpose of you drinking the oral contrast is to aid in the visualization of your intestinal tract. The contrast solution may cause some diarrhea. Depending on your individual set of symptoms, you may also receive an intravenous injection of x-ray contrast/dye. Plan on being at [facility] for 45 minutes or longer, depending on the type of exam you are having performed.   If you have any questions regarding your exam  or if you need to reschedule, you may call [facility] at [phone] between the hours of 8:00 am and 5:00 pm, Monday-Friday.   Thank you for entrusting me with your care and for choosing [facility], Dr. Inocente Hausen  _______________________________________________________  If your blood pressure at your visit was 140/90 or greater, please contact your primary care physician to follow up on this.  _______________________________________________________  If you are age 19 or older, your body mass index should be between 23-30. Your Body mass index is 51.4 kg/m. If this is out of the aforementioned range listed, please consider follow up with your Primary Care Provider.  If you are age 67 or younger, your body mass index should be between 19-25. Your Body mass index is 51.4 kg/m. If this is out of the aformentioned range listed, please consider follow up with your Primary Care Provider.   ________________________________________________________  The [facility] providers would like to encourage you to use MYCHART to communicate with providers for non-urgent requests or questions.  Due to long hold times on the telephone, sending your provider a message by [facility] may be a faster and more efficient way to get a response.  Please allow 48 business hours for a response.  Please remember that this is for non-urgent requests.  _______________________________________________________  [facility] is using a team-based approach to care.  Your team is made up of your doctor and two to three APPS. Our APPS (  Nurse Practitioners and Physician Assistants) work with your physician to ensure care continuity for you. They are fully qualified to address your health concerns and develop a treatment plan. They communicate directly with your gastroenterologist to care for you. Seeing the Advanced Practice Practitioners on your physician's team can help you by facilitating care more  promptly, often allowing for earlier appointments, access to diagnostic testing, procedures, and other specialty referrals.

## 2024-03-21 ENCOUNTER — Ambulatory Visit (HOSPITAL_COMMUNITY)
Admission: RE | Admit: 2024-03-21 | Discharge: 2024-03-21 | Disposition: A | Discharge status: Home / Self Care | Source: Ambulatory Visit | Attending: Pediatrics | Admitting: Pediatrics

## 2024-03-21 ENCOUNTER — Ambulatory Visit: Payer: Self-pay | Admitting: Pediatrics

## 2024-03-21 ENCOUNTER — Other Ambulatory Visit

## 2024-03-21 DIAGNOSIS — F332 Major depressive disorder, recurrent severe without psychotic features: Secondary | ICD-10-CM

## 2024-03-21 DIAGNOSIS — R109 Unspecified abdominal pain: Secondary | ICD-10-CM | POA: Insufficient documentation

## 2024-03-21 DIAGNOSIS — F4310 Post-traumatic stress disorder, unspecified: Secondary | ICD-10-CM | POA: Diagnosis not present

## 2024-03-21 LAB — COMPREHENSIVE METABOLIC PANEL WITH GFR
ALT: 47 U/L — ABNORMAL HIGH (ref 0–35)
AST: 42 U/L — ABNORMAL HIGH (ref 0–37)
Albumin: 4 g/dL (ref 3.5–5.2)
Alkaline Phosphatase: 82 U/L (ref 39–117)
BUN: 8 mg/dL (ref 6–23)
CO2: 27 meq/L (ref 19–32)
Calcium: 9.2 mg/dL (ref 8.4–10.5)
Chloride: 102 meq/L (ref 96–112)
Creatinine, Ser: 0.55 mg/dL (ref 0.40–1.20)
GFR: 117.19 mL/min (ref >60.00)
Glucose, Bld: 89 mg/dL (ref 70–99)
Potassium: 3.9 meq/L (ref 3.5–5.1)
Sodium: 137 meq/L (ref 135–145)
Total Bilirubin: 0.3 mg/dL (ref 0.2–1.2)
Total Protein: 7.5 g/dL (ref 6.0–8.3)

## 2024-03-21 LAB — CBC WITH DIFFERENTIAL/PLATELET
Basophils Absolute: 0.1 K/uL (ref 0.0–0.1)
Basophils Relative: 1.1 % (ref 0.0–3.0)
Eosinophils Absolute: 0.5 K/uL (ref 0.0–0.7)
Eosinophils Relative: 4.6 % (ref 0.0–5.0)
HCT: 38.3 % (ref 36.0–46.0)
Hemoglobin: 12.9 g/dL (ref 12.0–15.0)
Lymphocytes Relative: 26.4 % (ref 12.0–46.0)
Lymphs Abs: 2.8 K/uL (ref 0.7–4.0)
MCHC: 33.8 g/dL (ref 30.0–36.0)
MCV: 87.4 fl (ref 78.0–100.0)
Monocytes Absolute: 0.8 K/uL (ref 0.1–1.0)
Monocytes Relative: 7.3 % (ref 3.0–12.0)
Neutro Abs: 6.5 K/uL (ref 1.4–7.7)
Neutrophils Relative %: 60.6 % (ref 43.0–77.0)
Platelets: 441 K/uL — ABNORMAL HIGH (ref 150.0–400.0)
RBC: 4.38 Mil/uL (ref 3.87–5.11)
RDW: 14.4 % (ref 11.5–15.5)
WBC: 10.7 K/uL — ABNORMAL HIGH (ref 4.0–10.5)

## 2024-03-21 LAB — TSH: TSH: 1.8 u[IU]/mL (ref 0.35–5.50)

## 2024-03-21 LAB — SEDIMENTATION RATE: Sed Rate: 66 mm/h — ABNORMAL HIGH (ref 0–20)

## 2024-03-21 LAB — C-REACTIVE PROTEIN: CRP: 1.7 mg/dL (ref 0.5–20.0)

## 2024-03-21 MED ORDER — IOHEXOL 300 MG/ML  SOLN
100.0000 mL | Freq: Once | INTRAMUSCULAR | Status: AC | PRN
  Administered 2024-03-21: 100 mL via INTRAVENOUS

## 2024-03-22 ENCOUNTER — Ambulatory Visit: Payer: Self-pay | Admitting: Pediatrics

## 2024-03-22 MED ORDER — AMOXICILLIN-POT CLAVULANATE 875-125 MG PO TABS
1.0000 | ORAL_TABLET | Freq: Two times a day (BID) | ORAL | 0 refills | Status: AC
Start: 2024-03-22 — End: 2024-04-05

## 2024-03-22 NOTE — Telephone Encounter (Signed)
 Patient returning call.

## 2024-03-23 LAB — HEPATITIS C ANTIBODY: Hepatitis C Ab: NONREACTIVE

## 2024-03-25 LAB — ANTI-SMOOTH MUSCLE ANTIBODY, IGG: Actin (Smooth Muscle) Antibody (IGG): <20 U (ref <20)

## 2024-03-26 LAB — ALPHA-1 ANTITRYPSIN PHENOTYPE: A-1 Antitrypsin, Ser: 174 mg/dL (ref 83–199)

## 2024-03-26 LAB — ANTI-NUCLEAR AB-TITER (ANA TITER)
ANA TITER: 1:40 {titer} — ABNORMAL HIGH
ANA Titer 1: 1:40 {titer} — ABNORMAL HIGH

## 2024-03-26 LAB — HEPATITIS A ANTIBODY, TOTAL: Hepatitis A AB,Total: REACTIVE — AB

## 2024-03-26 LAB — IGG: IgG (Immunoglobin G), Serum: 1654 mg/dL — ABNORMAL HIGH (ref 600–1640)

## 2024-03-26 LAB — TISSUE TRANSGLUTAMINASE ABS,IGG,IGA
(tTG) Ab, IgA: <1 U/mL
(tTG) Ab, IgG: <1 U/mL

## 2024-03-26 LAB — HEPATITIS B SURFACE ANTIBODY,QUALITATIVE: Hep B S Ab: NONREACTIVE

## 2024-03-26 LAB — HEPATITIS B SURFACE ANTIGEN: Hepatitis B Surface Ag: NONREACTIVE

## 2024-03-26 LAB — ANA: Anti Nuclear Antibody (ANA): POSITIVE — AB

## 2024-03-26 LAB — IGA: Immunoglobulin A: 343 mg/dL — ABNORMAL HIGH (ref 47–310)

## 2024-03-26 LAB — HEPATITIS B CORE ANTIBODY, TOTAL: Hep B Core Total Ab: NONREACTIVE

## 2024-04-20 ENCOUNTER — Encounter: Admitting: Obstetrics and Gynecology

## 2024-06-02 ENCOUNTER — Telehealth: Payer: Self-pay

## 2024-06-05 ENCOUNTER — Encounter: Admitting: Obstetrics
